# Patient Record
Sex: Female | Born: 1935 | Race: White | Hispanic: No | Smoking: Never smoker
Health system: Southern US, Community
[De-identification: ages and names within clinical notes are randomized; demographics above are authoritative.]

## PROBLEM LIST (undated history)

## (undated) DIAGNOSIS — M4800 Spinal stenosis, site unspecified: Secondary | ICD-10-CM

## (undated) DIAGNOSIS — K5900 Constipation, unspecified: Secondary | ICD-10-CM

## (undated) DIAGNOSIS — F0280 Dementia in other diseases classified elsewhere without behavioral disturbance: Secondary | ICD-10-CM

## (undated) DIAGNOSIS — I10 Essential (primary) hypertension: Secondary | ICD-10-CM

## (undated) DIAGNOSIS — E785 Hyperlipidemia, unspecified: Secondary | ICD-10-CM

## (undated) DIAGNOSIS — R109 Unspecified abdominal pain: Secondary | ICD-10-CM

## (undated) DIAGNOSIS — K648 Other hemorrhoids: Secondary | ICD-10-CM

## (undated) DIAGNOSIS — K644 Residual hemorrhoidal skin tags: Secondary | ICD-10-CM

## (undated) DIAGNOSIS — M1990 Unspecified osteoarthritis, unspecified site: Secondary | ICD-10-CM

## (undated) DIAGNOSIS — M5430 Sciatica, unspecified side: Secondary | ICD-10-CM

## (undated) DIAGNOSIS — I998 Other disorder of circulatory system: Secondary | ICD-10-CM

## (undated) DIAGNOSIS — K449 Diaphragmatic hernia without obstruction or gangrene: Secondary | ICD-10-CM

## (undated) DIAGNOSIS — M25579 Pain in unspecified ankle and joints of unspecified foot: Secondary | ICD-10-CM

## (undated) DIAGNOSIS — K623 Rectal prolapse: Secondary | ICD-10-CM

## (undated) DIAGNOSIS — K219 Gastro-esophageal reflux disease without esophagitis: Secondary | ICD-10-CM

## (undated) DIAGNOSIS — Z973 Presence of spectacles and contact lenses: Secondary | ICD-10-CM

## (undated) DIAGNOSIS — I8500 Esophageal varices without bleeding: Secondary | ICD-10-CM

## (undated) DIAGNOSIS — M419 Scoliosis, unspecified: Secondary | ICD-10-CM

## (undated) DIAGNOSIS — I341 Nonrheumatic mitral (valve) prolapse: Secondary | ICD-10-CM

## (undated) DIAGNOSIS — M353 Polymyalgia rheumatica: Secondary | ICD-10-CM

## (undated) DIAGNOSIS — K625 Hemorrhage of anus and rectum: Secondary | ICD-10-CM

## (undated) DIAGNOSIS — G309 Alzheimer's disease, unspecified: Secondary | ICD-10-CM

## (undated) DIAGNOSIS — M316 Other giant cell arteritis: Secondary | ICD-10-CM

## (undated) DIAGNOSIS — I4891 Unspecified atrial fibrillation: Secondary | ICD-10-CM

## (undated) HISTORY — DX: Unspecified osteoarthritis, unspecified site: M19.90

## (undated) HISTORY — PX: HERNIA REPAIR: SHX51

## (undated) HISTORY — DX: Constipation, unspecified: K59.00

## (undated) HISTORY — PX: ABDOMINAL HYSTERECTOMY: SHX81

## (undated) HISTORY — DX: Presence of spectacles and contact lenses: Z97.3

## (undated) HISTORY — PX: APPENDECTOMY: SHX54

## (undated) HISTORY — DX: Rectal prolapse: K62.3

## (undated) HISTORY — DX: Other disorder of circulatory system: I99.8

## (undated) HISTORY — DX: Diaphragmatic hernia without obstruction or gangrene: K44.9

## (undated) HISTORY — DX: Other giant cell arteritis: M31.6

## (undated) HISTORY — DX: Pain in unspecified ankle and joints of unspecified foot: M25.579

## (undated) HISTORY — DX: Hemorrhage of anus and rectum: K62.5

## (undated) HISTORY — DX: Unspecified abdominal pain: R10.9

## (undated) HISTORY — DX: Other hemorrhoids: K64.8

## (undated) HISTORY — PX: BREAST SURGERY: SHX581

## (undated) HISTORY — DX: Residual hemorrhoidal skin tags: K64.4

## (undated) HISTORY — DX: Polymyalgia rheumatica: M35.3

## (undated) HISTORY — DX: Esophageal varices without bleeding: I85.00

## (undated) HISTORY — PX: COLONOSCOPY: SHX174

---

## 1998-06-28 ENCOUNTER — Encounter: Admission: RE | Admit: 1998-06-28 | Discharge: 1998-06-28 | Payer: Self-pay | Admitting: Internal Medicine

## 1998-12-27 ENCOUNTER — Encounter: Admission: RE | Admit: 1998-12-27 | Discharge: 1998-12-27 | Payer: Self-pay | Admitting: Internal Medicine

## 1999-06-27 ENCOUNTER — Encounter: Admission: RE | Admit: 1999-06-27 | Discharge: 1999-06-27 | Payer: Self-pay | Admitting: Internal Medicine

## 1999-11-28 ENCOUNTER — Encounter: Admission: RE | Admit: 1999-11-28 | Discharge: 1999-11-28 | Payer: Self-pay | Admitting: Internal Medicine

## 2000-03-05 ENCOUNTER — Encounter: Admission: RE | Admit: 2000-03-05 | Discharge: 2000-03-05 | Payer: Self-pay | Admitting: Internal Medicine

## 2000-05-14 ENCOUNTER — Encounter: Admission: RE | Admit: 2000-05-14 | Discharge: 2000-05-14 | Payer: Self-pay | Admitting: Internal Medicine

## 2000-05-30 ENCOUNTER — Encounter: Admission: RE | Admit: 2000-05-30 | Discharge: 2000-05-30 | Payer: Self-pay | Admitting: Internal Medicine

## 2000-06-26 ENCOUNTER — Encounter: Admission: RE | Admit: 2000-06-26 | Discharge: 2000-06-26 | Payer: Self-pay | Admitting: Internal Medicine

## 2000-07-11 ENCOUNTER — Encounter: Admission: RE | Admit: 2000-07-11 | Discharge: 2000-07-11 | Payer: Self-pay

## 2000-07-23 ENCOUNTER — Encounter: Admission: RE | Admit: 2000-07-23 | Discharge: 2000-07-23 | Payer: Self-pay | Admitting: Internal Medicine

## 2000-08-28 ENCOUNTER — Encounter: Admission: RE | Admit: 2000-08-28 | Discharge: 2000-08-28 | Payer: Self-pay | Admitting: Internal Medicine

## 2001-03-12 ENCOUNTER — Encounter: Admission: RE | Admit: 2001-03-12 | Discharge: 2001-03-12 | Payer: Self-pay | Admitting: Internal Medicine

## 2001-08-19 ENCOUNTER — Encounter: Admission: RE | Admit: 2001-08-19 | Discharge: 2001-08-19 | Payer: Self-pay | Admitting: Internal Medicine

## 2001-12-18 ENCOUNTER — Encounter: Admission: RE | Admit: 2001-12-18 | Discharge: 2001-12-18 | Payer: Self-pay | Admitting: Internal Medicine

## 2002-01-30 ENCOUNTER — Encounter: Admission: RE | Admit: 2002-01-30 | Discharge: 2002-01-30 | Payer: Self-pay | Admitting: Internal Medicine

## 2002-04-04 ENCOUNTER — Encounter: Payer: Self-pay | Admitting: Emergency Medicine

## 2002-04-04 ENCOUNTER — Emergency Department (HOSPITAL_COMMUNITY): Admission: EM | Admit: 2002-04-04 | Discharge: 2002-04-04 | Payer: Self-pay | Admitting: Emergency Medicine

## 2002-06-11 ENCOUNTER — Ambulatory Visit (HOSPITAL_COMMUNITY): Admission: RE | Admit: 2002-06-11 | Discharge: 2002-06-11 | Payer: Self-pay | Admitting: Internal Medicine

## 2002-06-11 ENCOUNTER — Encounter: Payer: Self-pay | Admitting: Internal Medicine

## 2002-10-29 ENCOUNTER — Encounter: Admission: RE | Admit: 2002-10-29 | Discharge: 2002-10-29 | Payer: Self-pay | Admitting: Internal Medicine

## 2003-06-18 ENCOUNTER — Ambulatory Visit (HOSPITAL_COMMUNITY): Admission: RE | Admit: 2003-06-18 | Discharge: 2003-06-18 | Payer: Self-pay | Admitting: Internal Medicine

## 2003-06-18 ENCOUNTER — Encounter: Payer: Self-pay | Admitting: Internal Medicine

## 2004-04-14 ENCOUNTER — Encounter: Admission: RE | Admit: 2004-04-14 | Discharge: 2004-04-14 | Payer: Self-pay | Admitting: Internal Medicine

## 2004-07-14 ENCOUNTER — Ambulatory Visit (HOSPITAL_COMMUNITY): Admission: RE | Admit: 2004-07-14 | Discharge: 2004-07-14 | Payer: Self-pay | Admitting: Internal Medicine

## 2005-07-27 ENCOUNTER — Ambulatory Visit (HOSPITAL_COMMUNITY): Admission: RE | Admit: 2005-07-27 | Discharge: 2005-07-27 | Payer: Self-pay | Admitting: Internal Medicine

## 2006-06-12 ENCOUNTER — Encounter: Admission: RE | Admit: 2006-06-12 | Discharge: 2006-06-12 | Payer: Self-pay | Admitting: Specialist

## 2006-08-02 ENCOUNTER — Ambulatory Visit (HOSPITAL_COMMUNITY): Admission: RE | Admit: 2006-08-02 | Discharge: 2006-08-02 | Payer: Self-pay | Admitting: Internal Medicine

## 2007-08-05 ENCOUNTER — Ambulatory Visit (HOSPITAL_COMMUNITY): Admission: RE | Admit: 2007-08-05 | Discharge: 2007-08-05 | Payer: Self-pay | Admitting: *Deleted

## 2007-10-07 ENCOUNTER — Encounter: Admission: RE | Admit: 2007-10-07 | Discharge: 2007-10-07 | Payer: Self-pay | Admitting: Specialist

## 2008-08-06 ENCOUNTER — Ambulatory Visit (HOSPITAL_COMMUNITY): Admission: RE | Admit: 2008-08-06 | Discharge: 2008-08-06 | Payer: Self-pay | Admitting: *Deleted

## 2009-06-25 ENCOUNTER — Ambulatory Visit (HOSPITAL_COMMUNITY): Admission: RE | Admit: 2009-06-25 | Discharge: 2009-06-25 | Payer: Self-pay | Admitting: Internal Medicine

## 2009-08-09 ENCOUNTER — Ambulatory Visit (HOSPITAL_COMMUNITY): Admission: RE | Admit: 2009-08-09 | Discharge: 2009-08-09 | Payer: Self-pay | Admitting: Internal Medicine

## 2009-09-13 ENCOUNTER — Encounter: Admission: RE | Admit: 2009-09-13 | Discharge: 2009-09-13 | Payer: Self-pay | Admitting: Internal Medicine

## 2009-09-17 ENCOUNTER — Emergency Department (HOSPITAL_COMMUNITY): Admission: EM | Admit: 2009-09-17 | Discharge: 2009-09-18 | Payer: Self-pay | Admitting: Emergency Medicine

## 2009-09-17 ENCOUNTER — Encounter: Admission: RE | Admit: 2009-09-17 | Discharge: 2009-09-17 | Payer: Self-pay | Admitting: Internal Medicine

## 2009-09-18 ENCOUNTER — Inpatient Hospital Stay (HOSPITAL_COMMUNITY): Admission: EM | Admit: 2009-09-18 | Discharge: 2009-09-19 | Payer: Self-pay | Admitting: Emergency Medicine

## 2010-02-11 ENCOUNTER — Encounter: Admission: RE | Admit: 2010-02-11 | Discharge: 2010-02-11 | Payer: Self-pay | Admitting: Internal Medicine

## 2010-08-15 ENCOUNTER — Ambulatory Visit (HOSPITAL_COMMUNITY): Admission: RE | Admit: 2010-08-15 | Discharge: 2010-08-15 | Payer: Self-pay | Admitting: Internal Medicine

## 2010-09-12 ENCOUNTER — Encounter: Admission: RE | Admit: 2010-09-12 | Discharge: 2010-09-12 | Payer: Self-pay | Admitting: Internal Medicine

## 2010-10-19 ENCOUNTER — Encounter: Payer: Self-pay | Admitting: Physician Assistant

## 2010-10-21 ENCOUNTER — Encounter
Admission: RE | Admit: 2010-10-21 | Discharge: 2010-10-21 | Payer: Self-pay | Source: Home / Self Care | Attending: Surgery | Admitting: Surgery

## 2010-10-24 ENCOUNTER — Ambulatory Visit: Admit: 2010-10-24 | Payer: Self-pay | Admitting: Physician Assistant

## 2010-10-24 DIAGNOSIS — I4891 Unspecified atrial fibrillation: Secondary | ICD-10-CM | POA: Insufficient documentation

## 2010-10-24 DIAGNOSIS — I10 Essential (primary) hypertension: Secondary | ICD-10-CM | POA: Insufficient documentation

## 2010-10-24 DIAGNOSIS — M1990 Unspecified osteoarthritis, unspecified site: Secondary | ICD-10-CM | POA: Insufficient documentation

## 2010-10-25 ENCOUNTER — Ambulatory Visit: Admission: RE | Admit: 2010-10-25 | Discharge: 2010-10-25 | Payer: Self-pay | Source: Home / Self Care

## 2010-10-25 ENCOUNTER — Encounter: Payer: Self-pay | Admitting: Physician Assistant

## 2010-10-25 ENCOUNTER — Encounter: Payer: Self-pay | Admitting: Cardiology

## 2010-10-25 ENCOUNTER — Ambulatory Visit
Admission: RE | Admit: 2010-10-25 | Discharge: 2010-10-25 | Payer: Self-pay | Source: Home / Self Care | Attending: Physician Assistant | Admitting: Physician Assistant

## 2010-10-25 ENCOUNTER — Ambulatory Visit (HOSPITAL_COMMUNITY)
Admission: RE | Admit: 2010-10-25 | Discharge: 2010-10-25 | Payer: Self-pay | Source: Home / Self Care | Attending: Cardiology | Admitting: Cardiology

## 2010-10-25 DIAGNOSIS — R011 Cardiac murmur, unspecified: Secondary | ICD-10-CM | POA: Insufficient documentation

## 2010-10-28 ENCOUNTER — Telehealth (INDEPENDENT_AMBULATORY_CARE_PROVIDER_SITE_OTHER): Payer: Self-pay | Admitting: *Deleted

## 2010-11-10 NOTE — Letter (Signed)
Summary: Dr. Cathie Olden Office  Dr. Cathie Olden Office   Imported By: Marylou Mccoy 11/03/2010 12:21:03  _____________________________________________________________________  External Attachment:    Type:   Image     Comment:   External Document

## 2010-11-10 NOTE — Progress Notes (Signed)
Summary: Records Request  Faxed OV to [facility] at [facility] ([phone]). Debby Freiberg  October 28, 2010 2:47 PM

## 2010-11-10 NOTE — Assessment & Plan Note (Signed)
Summary: PER OP CLEARANCE ./CY   Visit Type:  surg clearance Referring Provider:  Karie Soda Primary Provider:  Murray Hodgkins  CC:  surgery needed for rectal prolapse.......edema/ankles at times...denies any other complaints today.  History of Present Illness: Tammy Myers is an 77 yo female with reported h/o Parox AFib.  She is on coumadin that is managed by her PCP.  She does not recall ever being established with cardiology.  She lives at [facility] and was a quite active Octogenarian until she developed rectal prolapse in December.  She denies a h/o exertional chest pain or dyspnea.  She denies syncope, near syncope, palpitations, orthopnea, PND or pedal edema.  She has been evaluated by Dr. Michaell Cowing and the plan is to proceed with surgical correction of her rectal prolapse.  She is seen today for surgical clearance.   Current Medications (verified): 1)  Calcium Citrate 250 Mg Tabs (Calcium Citrate) .... 2 Tab Once Daily 2)  Hydrocodone-Acetaminophen 7.5-500 Mg Tabs (Hydrocodone-Acetaminophen) .[name].. 1 Tab Two Times A Day 3)  Prednisone 5 Mg Tabs (Prednisone) .[name].. 1 Tab Once Daily 4)  Senokot 8.6 Mg Tabs (Sennosides) .... 2 Tab At Bedtime As Needed 5)  Aricept 5 Mg Tabs (Donepezil Hcl) .[name].. 1 Tab Once Daily 6)  Coumadin 4 Mg Tabs (Warfarin Sodium) .... On Hold  Allergies (verified): No Known Drug Allergies  Past History:  Past Medical History: History of paroxysmal atrial fibrillation on chronic Coumadin per PCP Reported h/o Hypertension but she denies this and is not on medications Osteoarthritis.  Osteoporosis Reported h/o mitral valve prolapse Polymyalgia Rheumatica Temporal Arteritis      Past Surgical History: Hysterectomy.  Appendectomy Hernia repair  Family History: She is a triplet and has her sister with her today. No premature CAD.  Social History: [facility]  She is a retired  Environmental manager.  There is no history of  alcohol or tobacco use.      Review of Systems       As per  the HPI.  All other systems reviewed and negative.   Vital Signs:  Patient profile:   77 year old female Height:      64 inches Weight:      105 pounds BMI:     18.09 Pulse rate:   91 / minute Pulse rhythm:   irregular BP sitting:   98 / 58  (right arm) Cuff size:   regular  Vitals Entered By: Danielle Rankin, CMA (October 25, 2010 12:52 PM)  Physical Exam  General:  Well nourished, well developed, elderly female in no acute distress HEENT: normal Neck: no JVD at 90 degrees Cardiac:  normal S1, S2; RRR; 1-2/6 systolic murmur at RUSB Lungs:  clear to auscultation bilaterally, no wheezing, rhonchi or rales Abd: soft, nontender, no hepatomegaly Ext: trace ankle edema bilat. Vascular: no carotid  bruits Skin: warm and dry Neuro:  CNs 2-12 intact, no focal abnormalities noted    EKG  Procedure date:  10/25/2010  Findings:      Normal sinus rhythm with rate of:  91 LAD PRWP NSSTTW changes  Impression & Recommendations:  Problem # 1:  ATRIAL FIBRILLATION, PAROXYSMAL (ICD-427.31) She is maintaining NSR.  Coumadin is managed by her PCP. She is certainly at risk for recurrent AFib during surgery and close attention should be made to control her rate if this should happen.  Orders: EKG w/ Interpretation (93000)  Problem # 2:  PRE-OPERATIVE CARDIOVASCULAR EXAMINATION (ICD-V72.81)  She is not having any angina and does not appear to have any unstable cardiac conditions.  She does have a murmur on exam.  Will check an echo to make sure she does not have any significant valvular pathology or LV dysfunction.  If this is ok, she will not require any further cardiac workup.  With her age, she is at mild to moderate risk for cardiac complications.  Other Orders: Echocardiogram (Echo)  Patient Instructions: 1)  Your physician recommends that you schedule a follow-up appointment in: AS NEEDED 2)  Your physician  recommends that you continue on your current medications as directed. Please refer to the Current Medication list given to you today. 3)  Your physician has requested that you have an echocardiogram.  Echocardiography is a painless test that uses sound waves to create images of your heart. It provides your doctor with information about the size and shape of your heart and how well your heart's chambers and valves are working.  This procedure takes approximately one hour. There are no restrictions for this procedure. TODAY    Appended Document: PER OP CLEARANCE ./CY Patient seen with Tereso Newcomer.  She clearly needs surgery.  No angina or CHF symptoms.  She does have a SEM which is prominent, and perhaps consistent with AS of moderate degree.  Echo is pending.  TS

## 2010-11-14 ENCOUNTER — Other Ambulatory Visit: Payer: Self-pay | Admitting: Surgery

## 2010-11-14 ENCOUNTER — Ambulatory Visit (HOSPITAL_COMMUNITY)
Admission: RE | Admit: 2010-11-14 | Discharge: 2010-11-14 | Disposition: A | Discharge status: Home / Self Care | Payer: Medicare Other | Source: Ambulatory Visit | Attending: Surgery | Admitting: Surgery

## 2010-11-14 ENCOUNTER — Other Ambulatory Visit (HOSPITAL_COMMUNITY): Payer: Self-pay | Admitting: Surgery

## 2010-11-14 ENCOUNTER — Telehealth (INDEPENDENT_AMBULATORY_CARE_PROVIDER_SITE_OTHER): Payer: Self-pay | Admitting: *Deleted

## 2010-11-14 ENCOUNTER — Encounter (HOSPITAL_COMMUNITY): Payer: Medicare Other

## 2010-11-14 DIAGNOSIS — Z01818 Encounter for other preprocedural examination: Secondary | ICD-10-CM

## 2010-11-14 DIAGNOSIS — Z01812 Encounter for preprocedural laboratory examination: Secondary | ICD-10-CM | POA: Insufficient documentation

## 2010-11-14 LAB — CBC
HCT: 40.2 % (ref 36.0–46.0)
Hemoglobin: 13.1 g/dL (ref 12.0–15.0)
RBC: 4.02 MIL/uL (ref 3.87–5.11)
RDW: 14.5 % (ref 11.5–15.5)
WBC: 7.4 10*3/uL (ref 4.0–10.5)

## 2010-11-14 LAB — PROTIME-INR: INR: 0.98 (ref 0.00–1.49)

## 2010-11-14 LAB — APTT: aPTT: 31 seconds (ref 24–37)

## 2010-11-14 LAB — SURGICAL PCR SCREEN: Staphylococcus aureus: NEGATIVE

## 2010-11-16 ENCOUNTER — Inpatient Hospital Stay (HOSPITAL_COMMUNITY)
Admission: RE | Admit: 2010-11-16 | Discharge: 2010-11-22 | DRG: 330 | Disposition: A | Discharge status: Skilled Nursing Facility | Payer: Medicare Other | Source: Ambulatory Visit | Attending: Surgery | Admitting: Surgery

## 2010-11-16 DIAGNOSIS — M316 Other giant cell arteritis: Secondary | ICD-10-CM | POA: Diagnosis present

## 2010-11-16 DIAGNOSIS — F068 Other specified mental disorders due to known physiological condition: Secondary | ICD-10-CM | POA: Diagnosis present

## 2010-11-16 DIAGNOSIS — I4891 Unspecified atrial fibrillation: Secondary | ICD-10-CM | POA: Diagnosis present

## 2010-11-16 DIAGNOSIS — M1990 Unspecified osteoarthritis, unspecified site: Secondary | ICD-10-CM | POA: Diagnosis present

## 2010-11-16 DIAGNOSIS — K623 Rectal prolapse: Principal | ICD-10-CM | POA: Diagnosis present

## 2010-11-16 DIAGNOSIS — M353 Polymyalgia rheumatica: Secondary | ICD-10-CM | POA: Diagnosis present

## 2010-11-16 DIAGNOSIS — I059 Rheumatic mitral valve disease, unspecified: Secondary | ICD-10-CM | POA: Diagnosis present

## 2010-11-16 DIAGNOSIS — Q438 Other specified congenital malformations of intestine: Secondary | ICD-10-CM

## 2010-11-16 DIAGNOSIS — M5430 Sciatica, unspecified side: Secondary | ICD-10-CM | POA: Diagnosis present

## 2010-11-16 DIAGNOSIS — M810 Age-related osteoporosis without current pathological fracture: Secondary | ICD-10-CM | POA: Diagnosis present

## 2010-11-16 DIAGNOSIS — Z7901 Long term (current) use of anticoagulants: Secondary | ICD-10-CM

## 2010-11-16 HISTORY — PX: RECTAL PROLAPSE REPAIR, RECTOPEXY: SHX2311

## 2010-11-17 ENCOUNTER — Other Ambulatory Visit: Payer: Self-pay | Admitting: Surgery

## 2010-11-17 LAB — CREATININE, SERUM
Creatinine, Ser: 0.74 mg/dL (ref 0.4–1.2)
GFR calc Af Amer: >60 mL/min (ref >60)
GFR calc non Af Amer: >60 mL/min (ref >60)
GFR calc non Af Amer: >60 mL/min (ref >60)

## 2010-11-17 LAB — CBC
MCV: 98.4 fL (ref 78.0–100.0)
Platelets: 170 10*3/uL (ref 150–400)
Platelets: 182 10*3/uL (ref 150–400)
RBC: 3.72 MIL/uL — ABNORMAL LOW (ref 3.87–5.11)
RDW: 14.5 % (ref 11.5–15.5)
WBC: 19.7 10*3/uL — ABNORMAL HIGH (ref 4.0–10.5)
WBC: 18.2 10*3/uL — ABNORMAL HIGH (ref 4.0–10.5)

## 2010-11-17 LAB — POCT I-STAT 4, (NA,K, GLUC, HGB,HCT)
Glucose, Bld: 97 mg/dL (ref 70–99)
HCT: 39 % (ref 36.0–46.0)

## 2010-11-17 LAB — POTASSIUM: Potassium: 3.9 mEq/L (ref 3.5–5.1)

## 2010-11-18 LAB — CBC
MCH: 32.3 pg (ref 26.0–34.0)
MCHC: 32.6 g/dL (ref 30.0–36.0)
Platelets: 155 10*3/uL (ref 150–400)
RDW: 14.8 % (ref 11.5–15.5)

## 2010-11-18 LAB — CREATININE, SERUM: GFR calc Af Amer: >60 mL/min (ref >60)

## 2010-11-19 LAB — CREATININE, SERUM: GFR calc non Af Amer: >60 mL/min (ref >60)

## 2010-11-19 LAB — CBC
HCT: 36.5 % (ref 36.0–46.0)
MCHC: 33.2 g/dL (ref 30.0–36.0)
MCV: 98.4 fL (ref 78.0–100.0)
RDW: 14.4 % (ref 11.5–15.5)

## 2010-11-21 LAB — CBC
Hemoglobin: 13.4 g/dL (ref 12.0–15.0)
MCH: 32.5 pg (ref 26.0–34.0)
MCV: 96.6 fL (ref 78.0–100.0)
RBC: 4.12 MIL/uL (ref 3.87–5.11)

## 2010-11-21 LAB — BASIC METABOLIC PANEL
GFR calc non Af Amer: >60 mL/min (ref >60)
Glucose, Bld: 103 mg/dL — ABNORMAL HIGH (ref 70–99)
Potassium: 3.4 mEq/L — ABNORMAL LOW (ref 3.5–5.1)
Sodium: 136 mEq/L (ref 135–145)

## 2010-11-21 LAB — PROTIME-INR: Prothrombin Time: 13 seconds (ref 11.6–15.2)

## 2010-11-21 NOTE — Op Note (Signed)
NAMEMarland Myers  GISSELLE, Myers            ACCOUNT NO.:  [id number]  MEDICAL RECORD NO.:  [id number]           PATIENT TYPE:  O  LOCATION:  XRAY                         FACILITY[facility]  [facility]  PHYSICIAN:  Ardeth Sportsman, MD     DATE OF BIRTH:  20-Oct-1920  DATE OF PROCEDURE: DATE OF DISCHARGE:  11/14/2010                              OPERATIVE REPORT   PRIMARY CARE PHYSICIAN:  Lenon Curt. Chilton Si, MD  GASTROENTEROLOGIST:  [facility].  ASSISTANT:  Royston Sinner  PREOPERATIVE DIAGNOSIS:  Rectal prolapse.  POSTOPERATIVE DIAGNOSIS:  Rectal prolapse.  PROCEDURES PERFORMED: 1. Diagnostic laparoscopy. 2. Laparoscopic-assisted sigmoid colectomy. 3. Laparoscopic rectopexy with suturing and posterior reinforcement     biologic mesh.  ANESTHESIA: 1. General anesthesia. 2. Local anesthetic field block.  SPECIMENS: 1. Sigmoid colon.  Open end is proximal. 2. Anastomotic rings.  Blue stitch in the proximal ring.  DRAINS:  None.  ESTIMATED BLOOD LOSS:  50 mL.  COMPLICATIONS:  None. INDICATIONS:  Ms. Tammy Myers is an 77 year old female who has pretty good functional life.  However, she has been having recurrent prolapsing episodes.  This has been going on for over 6 months.  She has had episodes of having to go to emergency room and have it reduced.  But, she had to come to our office twice to have it reduced.  Anatomy and physiology of the digestive tract was explained.  Pathophysiology of pelvic floor prolapse and rectal procidentia/prolapse was discussed. After discussion, recommendation was made for rectopexy with suture and mesh.  Possibility of resection and redundant colon have been discussed as well.  Risks, benefits, and alternatives were discussed.  Questions answered.  She agreed to proceed.  OPERATIVE FINDINGS:  She had rectal procidentia of about at 8 cm very easily, some times more in the office.  She had a very redundant sigmoid colon with pretty good adhesions of her  descending colon.  She had no major intraabdominal adhesions.  DESCRIPTION OF PROCEDURE:  Informed consent was confirmed.  The patient had failed to do an all prep, although she has had loose stools already. She held her warfarin preoperatively.  She was given subcutaneous heparin preoperatively.  She had sequential compression devices during the entire case.  She underwent general anesthesia without any difficulty.  She was placed in lithotomy with arms tucked.  Her abdomen and perineum were prepped and draped in sterile fashion.  A surgical time-out confirmed the plan.  I placed a #5 mm port through the inferior part of the umbilicus using a cut down technique.  I induced carbon dioxide insufflation and made sure she had a clean entry.  Camera inspection was clear.  I placed a 5-mm port in the left and right mid abdomen.  I placed a 2 mm port in the right lower quadrant later in the case.  I did diagnostic laparoscopy and positioned her head down and got the small bowel out of the way.  The terminal ileum did have some adhesions down in the pelvis, I mobilized those off, the retroperitoneum and pelvic brim to help get that out of the way.  I could see she had a  very redundant sigmoid colon.  I could pull the rectum up, when I did that, sigmoid colon started to twist.  It was the complete volvulus, but it was concerning for that.  Therefore, I felt that resection was warranted.  I scored the peritoneum on the proximal rectum into the presacral region posterior to the mesorectum.  I followed that off bluntly and mobilized that off down to the level of coccyx.  I transected the ligaments between the rectum and the coccyx as well until I can get down the level of the levators posteriorly.  This provided good posterior medialization.  I avoided taking any of the lateral side feeding branches to the remainder of the distal rectum.  I did create a window in the proximal rectum as well,  away from the major levators as well.  With this good mobilization, I could bring the mid-proximal rectum up. I created a window in the mesentery and transected at the junction between the proximal and mid-rectum using a laparoscopic stapler.  I made a 5-cm transverse incision suprapubically and placed a GelPort as a wound protector.  I eviscerated the sigmoid colon.  I took the sigmoid colon mesentery using the EnSeal bipolar system upon the sigmoid and doing the ligation extracorporeally.  I followed that down until I came to the junction between the sigmoid and descending colon where there were nice lateral attachments.  I did a soft bowel clamp and transected at the sigmoid/descending colon junction.  We did sizers and a 33-mm EEA sizer easily fell through.  I placed a 0-Prolene stitch around the proximal end, placed an anvil with 33-mm EEA stapler in.  Dr. Carolynne Edouard scrubbed down.  I did rigid proctoscopy.  The rectum was cleared out.  We could easily pass the stapler out.  We brought the spike out from the staple line of the proximal rectal stump.  I secured the inflow of the descending colon to the proximal rectal stump and tightened the stapler down on the anvil.  The stapler was then clamped for 60 seconds.  It was fired.  It was clamped while fired for 30 seconds.  The stapler was released.  Dr. Carolynne Edouard got 2 excellent anastomotic rings.  We performed the leak test with me clamping the descending colon proximally and doing insufflation.  He did rectal endoluminal  insufflation under water.  There was no leak.  It had excellent distention.  The anastomotic line looked healthy without any ischemia.  Our next procedure was rectopexy.  I took a Flex HD Biologic 10 x 16-cm mesh.  Having trimmed a few centimeters off in both dimensions, I could layer it in the prerectal space.  I used a laparoscopic tacker and tacked it to the sacrum posteriorly.  I secured the posterior rectum to the  mesh using interrupted 3-0 PDS stitches x4 and that helped pulling the rectum up under good tension and avoiding any re-prolapse.  I placed another stitch in the proximal rectum to the sacral promontory periosteum on the right.  I could not see a good view on the left so I took the proximal rectum on the left and secured it to the left pelvic brim to some good strong lateral sidewall attachments.  They did not seem wispy.  That provided some good tension with mesh as well as rectopexy.  Therefore, she was pexied in 3 places on each side for a total of 6 locations.  We did copious irrigation with clear return.  Again, the anastomosis  was healthy and viable and was not kinked or twisted.  There was no more redundant colon.  The patient was extubated and sent to recovery room in stable condition. I anticipated her being able to go to the floor.  I have discussed postoperative findings with the patient's family.     Ardeth Sportsman, MD     SCG/MEDQ  D:  11/16/2010  T:  11/17/2010  Job:  [id number]  Electronically Signed by Karie Soda MD on 11/21/2010 02:20:26 PM

## 2010-11-22 LAB — PROTIME-INR: INR: 1.08 (ref 0.00–1.49)

## 2010-11-24 NOTE — Progress Notes (Signed)
Summary: Records Request  Faxed OV & EKG to [facility] at [facility] ([phone]). Debby Freiberg  November 14, 2010 11:52 AM

## 2010-11-28 NOTE — Discharge Summary (Signed)
NAMEMarland Kitchen  LEWIS, GRIVAS            ACCOUNT NO.:  [id number]  MEDICAL RECORD NO.:  [id number]           PATIENT TYPE:  I  LOCATION:  1607                         FACILITY:  [facility]  PHYSICIAN:  Ardeth Sportsman, MD     DATE OF BIRTH:  1921-02-20  DATE OF ADMISSION:  11/16/2010 DATE OF DISCHARGE:  11/22/2010                              DISCHARGE SUMMARY   PRIMARY CARE PHYSICIAN:  Lenon Curt. Chilton Si, MD  CARDIOLOGIST:  Selena Batten, Tereso Newcomer, PA-C, for Arturo Morton Riley Kill, MD, FACC  SURGEON:  Ardeth Sportsman, MD  PRINCIPAL DIAGNOSIS:  Rectal prolapse.  PRINCIPAL PROCEDURE PERFORMED:  Laparoscopically assisted sigmoid colectomy and rectopexy, November 16, 2010.  OTHER DIAGNOSES: 1. Atrial fibrillation, on chronic anticoagulation. 2. Temporal arteritis. 3. Polymyalgia rheumatica. 4. Mitral valve prolapse. 5. Osteoporosis. 6. Osteoarthritis. 7. Probable early dementia. 8. Chronic pain and sciatica. 9. Scoliosis. 10.Mild memory impairment, really not dementia.  DISCHARGE MEDICATIONS: 1. Aricept 5 mg p.o. daily. 2. Hydrocodone 5/500 one p.o. b.i.d. p.r.n. mild to moderate pain. 3. Oxycodone 5-10 mg p.o. q.6 hours moderate to severe pain.  One or     the other not both at the same time. 4. Colace 100 mg p.o. daily decreased from b.i.d. 5. Calcium carbonate/vitamin D p.o. daily. 6. Prednisone 5 mg p.o. q.a.m. 7. Warfarin 4 mg p.o. q.a.m. and adjusted per [facility] and     Dr. Chilton Si.  SUMMARY OF HOSPITAL COURSE:  Tammy Myers is an 77 year old female who has had recurrent episodes of rectal prolapse, severely debilitating with uncontrollable diarrhea.  She has a little bit of mild memory impairment.  She is actually quite functional at [facility] and given her decent quality of life and debilitating status with this condition, she was sent to me here for surgical evaluation.  She underwent surgery as noted above.  She had redundant colon and required  resection.  Postoperatively, she was placed on anti-ileus protocol.  She was mobilized.  She was started on sips.  She had mild decreased urine output, responded to fluid.  She had evaluation by Physical Therapy and Occupational Therapy and it was felt that she would benefit from skilled placement for the first few weeks, next she was advanced her diet.  Her blood pressure was elevated, I did place her on some perioperative metoprolol and that seemed to control things well.  By the time of discharge, she was walking in the hallways with assistant, she was having regular bowel movements, she was tolerating p.o.  She had better p.o. pain control after switching on hydrocodone and oxycodone.  Based on improvement, I will reasonably discharge home with the following instructions, 1. She is to go to [facility] for brief skilled facility rehab. 2. She should return to clinic to see me in 1-2 weeks. 3. She should call if she has worsening fever, chills, sweats, nausea,     vomiting, uncontrolled pain, diarrhea, drains from her incisions,  or other concerns. 4. She should have her anticoagulation check regularly from     prothrombin time, INR at the skilled facility and adjust it as     needed.  Anticoagulation per Dr. Chilton Si and [facility], Dr.     Riley Kill.     Ardeth Sportsman, MD     SCG/MEDQ  D:  11/22/2010  T:  11/22/2010  Job:  [id number]  Electronically Signed by Karie Soda MD on 11/28/2010 08:46:52 AM

## 2010-12-20 NOTE — Letter (Signed)
Summary: [facility]iac Clearance   [facility]iac Clearance   Imported By: Roderic Ovens 12/09/2010 15:00:58  _____________________________________________________________________  External Attachment:    Type:   Image     Comment:   External Document

## 2011-01-10 LAB — DIFFERENTIAL
Basophils Absolute: 0 10*3/uL (ref 0.0–0.1)
Eosinophils Relative: 0 % (ref 0–5)
Lymphocytes Relative: 11 % — ABNORMAL LOW (ref 12–46)
Lymphocytes Relative: 17 % (ref 12–46)
Lymphs Abs: 1.1 10*3/uL (ref 0.7–4.0)
Monocytes Absolute: 0.3 10*3/uL (ref 0.1–1.0)
Monocytes Relative: 6 % (ref 3–12)
Neutrophils Relative %: 75 % (ref 43–77)

## 2011-01-10 LAB — URINE MICROSCOPIC-ADD ON

## 2011-01-10 LAB — URINALYSIS, ROUTINE W REFLEX MICROSCOPIC
Bilirubin Urine: NEGATIVE
Glucose, UA: NEGATIVE mg/dL
Glucose, UA: NEGATIVE mg/dL
Leukocytes, UA: NEGATIVE
Nitrite: NEGATIVE
Protein, ur: NEGATIVE mg/dL
Specific Gravity, Urine: 1.023 (ref 1.005–1.030)
Urobilinogen, UA: 0.2 mg/dL (ref 0.0–1.0)
Urobilinogen, UA: 0.2 mg/dL (ref 0.0–1.0)

## 2011-01-10 LAB — CBC
MCHC: 32.6 g/dL (ref 30.0–36.0)
MCV: 101.7 fL — ABNORMAL HIGH (ref 78.0–100.0)
Platelets: 174 10*3/uL (ref 150–400)
Platelets: 191 10*3/uL (ref 150–400)
RDW: 14 % (ref 11.5–15.5)
WBC: 4.9 10*3/uL (ref 4.0–10.5)

## 2011-01-10 LAB — COMPREHENSIVE METABOLIC PANEL
AST: 47 U/L — ABNORMAL HIGH (ref 0–37)
AST: 57 U/L — ABNORMAL HIGH (ref 0–37)
Albumin: 3.3 g/dL — ABNORMAL LOW (ref 3.5–5.2)
Albumin: 3.6 g/dL (ref 3.5–5.2)
Alkaline Phosphatase: 200 U/L — ABNORMAL HIGH (ref 39–117)
Calcium: 9.3 mg/dL (ref 8.4–10.5)
Chloride: 100 mEq/L (ref 96–112)
Creatinine, Ser: 0.96 mg/dL (ref 0.4–1.2)
Creatinine, Ser: 1.1 mg/dL (ref 0.4–1.2)
GFR calc Af Amer: 57 mL/min — ABNORMAL LOW (ref >60)
GFR calc Af Amer: >60 mL/min (ref >60)
Potassium: 4.4 mEq/L (ref 3.5–5.1)
Total Bilirubin: 0.5 mg/dL (ref 0.3–1.2)

## 2011-01-10 LAB — LIPASE, BLOOD: Lipase: 12 U/L (ref 11–59)

## 2011-01-10 LAB — PROTIME-INR
INR: 2.19 — ABNORMAL HIGH (ref 0.00–1.49)
Prothrombin Time: 28.5 seconds — ABNORMAL HIGH (ref 11.6–15.2)
Prothrombin Time: 57.8 seconds — ABNORMAL HIGH (ref 11.6–15.2)

## 2011-01-10 LAB — APTT: aPTT: 49 seconds — ABNORMAL HIGH (ref 24–37)

## 2011-01-10 LAB — SEDIMENTATION RATE: Sed Rate: 10 mm/hr (ref 0–22)

## 2011-02-07 ENCOUNTER — Encounter (INDEPENDENT_AMBULATORY_CARE_PROVIDER_SITE_OTHER): Payer: Self-pay | Admitting: Surgery

## 2011-03-20 ENCOUNTER — Other Ambulatory Visit: Payer: Self-pay | Admitting: Internal Medicine

## 2011-03-20 ENCOUNTER — Ambulatory Visit
Admission: RE | Admit: 2011-03-20 | Discharge: 2011-03-20 | Disposition: A | Discharge status: Home / Self Care | Payer: PRIVATE HEALTH INSURANCE | Source: Ambulatory Visit | Attending: Internal Medicine | Admitting: Internal Medicine

## 2011-03-20 DIAGNOSIS — M549 Dorsalgia, unspecified: Secondary | ICD-10-CM

## 2011-08-28 ENCOUNTER — Encounter (INDEPENDENT_AMBULATORY_CARE_PROVIDER_SITE_OTHER): Payer: Self-pay | Admitting: Surgery

## 2011-08-28 ENCOUNTER — Ambulatory Visit (INDEPENDENT_AMBULATORY_CARE_PROVIDER_SITE_OTHER): Payer: Medicare Other | Admitting: Surgery

## 2011-08-28 VITALS — BP 108/60 | HR 88 | Temp 98.2°F | Resp 12 | Ht 63.0 in | Wt 102.0 lb

## 2011-08-28 DIAGNOSIS — K648 Other hemorrhoids: Secondary | ICD-10-CM | POA: Insufficient documentation

## 2011-08-28 DIAGNOSIS — K5900 Constipation, unspecified: Secondary | ICD-10-CM

## 2011-08-28 DIAGNOSIS — K5909 Other constipation: Secondary | ICD-10-CM | POA: Insufficient documentation

## 2011-08-28 NOTE — Patient Instructions (Signed)

## 2011-08-28 NOTE — Progress Notes (Signed)
Subjective:     Patient ID: Tammy Myers, female   DOB: 1921-06-20, 77 y.o.   MRN: [id number]  HPI  Patient Care Team: [name], MD as PCP - General (Internal Medicine) Theda Belfast as Consulting Physician (Gastroenterology) Shawnie Pons, MD as Consulting Physician (Cardiology)  This patient is a 77 y.o.female who presents today for surgical evaluation.   Reason for visit: Mucosal prolapse. Question of recurrent rectal prolapse.  Patient is an elderly female that I did a resection and rectopexy for rectal prolapse on last year. She claims to be taking a stool softer and having a bowel movement every day. She was sent to a female doctor. (?gynecologist - they cannot recall specialty nor location) whom noted prolapsing mucosa and was worried about recurrence.   She sent the patient to me for evaluation.  The patient denies any pain or bleeding. No discomfort. No fevers chills or sweats  Past Medical History  Diagnosis Date  . PMR (polymyalgia rheumatica)   . Temporal arteritis   . Osteoporosis   . Hearing loss   . Wears glasses   . Rectal bleeding   . Arthritis   . Rectal prolapse     Past Surgical History  Procedure Date  . Abdominal hysterectomy   . Appendectomy   . Colonoscopy   . Hernia repair     [facility]  . Colon surgery 11/16/2010    Lap sigmoid colectomy & biologic mesh rectopexy    History   Social History  . Marital Status: Widowed    Spouse Name: N/A    Number of Children: N/A  . Years of Education: N/A   Occupational History  . Not on file.   Social History Main Topics  . Smoking status: Never Smoker   . Smokeless tobacco: Not on file  . Alcohol Use: No  . Drug Use: No  . Sexually Active:    Other Topics Concern  . Not on file   Social History Narrative  . No narrative on file    Family History  Problem Relation Age of Onset  . Cancer Brother     bladder    Current outpatient prescriptions:Calcium Carbonate-Vitamin D (CALCIUM + D  PO), Take 750 mg by mouth 2 (two) times daily.  , Disp: , Rfl: ;  docusate sodium (COLACE) 100 MG capsule, Take 100 mg by mouth 2 (two) times daily.  , Disp: , Rfl: ;  donepezil (ARICEPT) 5 MG tablet, Take 5 mg by mouth at bedtime as needed.  , Disp: , Rfl: ;  ESTRACE VAGINAL 0.1 MG/GM vaginal cream, Once a week., Disp: , Rfl:  HYDROcodone-acetaminophen (VICODIN) 5-500 MG per tablet, Take 1 tablet by mouth every 6 (six) hours as needed.  , Disp: , Rfl: ;  predniSONE (DELTASONE) 5 MG tablet, Take 5 mg by mouth daily.  , Disp: , Rfl: ;  warfarin (COUMADIN) 4 MG tablet, Take 4 mg by mouth daily.  , Disp: , Rfl:   No Known Allergies     Review of Systems  Constitutional: Negative for fever, chills, diaphoresis, appetite change and fatigue.  HENT: Negative for ear pain, sore throat, trouble swallowing, neck pain and ear discharge.   Eyes: Negative for photophobia, discharge and visual disturbance.  Respiratory: Negative for cough, choking, chest tightness and shortness of breath.   Cardiovascular: Negative for chest pain and palpitations.  Gastrointestinal: Positive for constipation. Negative for nausea, vomiting, abdominal pain, diarrhea, blood in stool, abdominal distention, anal bleeding and rectal  pain.  Genitourinary: Positive for enuresis. Negative for dysuria, frequency, decreased urine volume, vaginal bleeding, vaginal discharge and difficulty urinating.  Musculoskeletal: Negative for myalgias and gait problem.  Skin: Negative for color change, pallor and rash.  Neurological: Negative for dizziness, speech difficulty, weakness and numbness.  Hematological: Negative for adenopathy.  Psychiatric/Behavioral: Negative for confusion and agitation. The patient is not nervous/anxious.        Objective:   Physical Exam  Constitutional: She is oriented to person, place, and time. She appears well-developed and well-nourished. No distress.  HENT:  Head: Normocephalic.  Mouth/Throat: Oropharynx  is clear and moist. No oropharyngeal exudate.  Eyes: Conjunctivae and EOM are normal. Pupils are equal, round, and reactive to light. No scleral icterus.  Neck: Normal range of motion and full passive range of motion without pain. No tracheal deviation present.       Severe cervical kyphosis  Cardiovascular: Normal rate and intact distal pulses.   Pulmonary/Chest: Effort normal. No respiratory distress. She exhibits no tenderness.  Abdominal: Soft. She exhibits no distension and no mass. There is no tenderness. There is no rebound and no guarding. Hernia confirmed negative in the right inguinal area and confirmed negative in the left inguinal area.       Incisions clean with normal healing ridges.  No hernias  Genitourinary: Vagina normal. No vaginal discharge found.       Perianal skin clean with good hygiene.  No pruritis.  No pilonidal disease.  No fissure.  No abscess/fistula.    Tolerates digital and anoscopic rectal exam.  Normal sphincter tone.  No rectal masses.  Large rectal vault with firm, hard stool  Hemorrhoid R post > ant prolapse.  L lat WNL.  The anatomy & physiology of the anorectal region was discussed.  The pathophysiology of hemorrhoids and differential diagnosis was discussed.  Natural history progression  was discussed.   I stressed the importance of a bowel regimen to have daily soft bowel movements to minimize progression of disease.     The patient's symptoms are not adequately controlled.  Therefore, I recommended banding to treat the hemorrhoids.  I went over the technique, risks, benefits, and alternatives.   Goals of post-operative recovery were discussed as well.  Questions were answered.  The patient expressed understanding & wished to proceed.  The patient was positioned in the lateral decubitus position.  Perianal & rectal examination was done.  Using anoscopy, I ligated the hemorrhoids above the dentate line with banding x 2 (one for R ant & post piles).  The  patient tolerated the procedure well.  Educational handouts further explaining the pathology, treatment options, and bowel regimen were given as well.     Musculoskeletal: Normal range of motion. She exhibits no tenderness.  Lymphadenopathy:       Right: No inguinal adenopathy present.       Left: No inguinal adenopathy present.  Neurological: She is alert and oriented to person, place, and time. No cranial nerve deficit. She exhibits normal muscle tone. Coordination normal.  Skin: Skin is warm and dry. No rash noted. She is not diaphoretic.  Psychiatric: She has a normal mood and affect. Her behavior is normal.       Assessment:     Prolapsing hemorrhoids - banded     Plan:     Increase activity as tolerated.  Do not push through pain.  Advanced on diet as tolerated. Bowel regimen to avoid problems.  Start fiber regularly  Return to clinic  one month. The patient expressed understanding and appreciation

## 2011-09-22 ENCOUNTER — Telehealth (INDEPENDENT_AMBULATORY_CARE_PROVIDER_SITE_OTHER): Payer: Self-pay | Admitting: Surgery

## 2011-09-25 ENCOUNTER — Encounter (INDEPENDENT_AMBULATORY_CARE_PROVIDER_SITE_OTHER): Payer: PRIVATE HEALTH INSURANCE | Admitting: Surgery

## 2011-10-12 ENCOUNTER — Ambulatory Visit (INDEPENDENT_AMBULATORY_CARE_PROVIDER_SITE_OTHER): Payer: Medicare Other | Admitting: Surgery

## 2011-10-12 ENCOUNTER — Encounter (INDEPENDENT_AMBULATORY_CARE_PROVIDER_SITE_OTHER): Payer: Self-pay | Admitting: Surgery

## 2011-10-12 VITALS — BP 124/76 | HR 80 | Temp 97.2°F | Resp 12 | Ht 63.0 in | Wt 112.2 lb

## 2011-10-12 DIAGNOSIS — K648 Other hemorrhoids: Secondary | ICD-10-CM

## 2011-10-12 DIAGNOSIS — K5900 Constipation, unspecified: Secondary | ICD-10-CM

## 2011-10-12 DIAGNOSIS — K5909 Other constipation: Secondary | ICD-10-CM

## 2011-10-12 NOTE — Progress Notes (Signed)
Subjective:     Patient ID: Tammy Myers, female   DOB: 05/12/1921, 78 y.o.   MRN: [id number]  HPI   Patient Care Team: [name], MD as PCP - General (Internal Medicine) Theda Belfast, MD as Consulting Physician (Gastroenterology) Shawnie Pons, MD as Consulting Physician (Cardiology) Robley Fries as Attending Physician (Obstetrics and Gynecology)  This patient is a 78 y.o.female who presents today for surgical evaluation.   Reason for visit: Mucosal prolapse. Question of recurrent rectal prolapse.  Patient is an elderly female that I did a resection and rectopexy for rectal prolapse on last year. She claims to be taking a stool softer and having a bowel movement every day. She was sent to a female doctor. (gynecologist - ?Dr. Juliene Pina near [facility]?..they think...) whom noted prolapsing mucosa and was worried about recurrence.   She sent the patient to me for evaluation.  I found some int hemorrhoids prolapsing that I banded.  She comes in for follow-up  The patient denies any pain or bleeding. No fevers chills or sweats.  Sore to sit on.  She thinks her bladder has fallen down - no urinary incont.  Due to see her gynecologist next week  Past Medical History  Diagnosis Date  . PMR (polymyalgia rheumatica)   . Temporal arteritis   . Osteoporosis   . Hearing loss   . Wears glasses   . Rectal bleeding   . Arthritis   . Rectal prolapse     Past Surgical History  Procedure Date  . Abdominal hysterectomy   . Appendectomy   . Colonoscopy   . Hernia repair     LIH  . Colon surgery 11/16/2010    Lap sigmoid colectomy & biologic mesh rectopexy    History   Social History  . Marital Status: Widowed    Spouse Name: N/A    Number of Children: N/A  . Years of Education: N/A   Occupational History  . Not on file.   Social History Main Topics  . Smoking status: Never Smoker   . Smokeless tobacco: Not on file  . Alcohol Use: No  . Drug Use: No  . Sexually  Active:    Other Topics Concern  . Not on file   Social History Narrative  . No narrative on file    Family History  Problem Relation Age of Onset  . Cancer Brother     bladder    Current outpatient prescriptions:Calcium Carbonate-Vitamin D (CALCIUM + D PO), Take 750 mg by mouth 2 (two) times daily.  , Disp: , Rfl: ;  docusate sodium (COLACE) 100 MG capsule, Take 100 mg by mouth 2 (two) times daily.  , Disp: , Rfl: ;  donepezil (ARICEPT) 5 MG tablet, Take 5 mg by mouth at bedtime as needed.  , Disp: , Rfl: ;  ESTRACE VAGINAL 0.1 MG/GM vaginal cream, Once a week., Disp: , Rfl:  HYDROcodone-acetaminophen (VICODIN) 5-500 MG per tablet, Take 1 tablet by mouth every 6 (six) hours as needed.  , Disp: , Rfl: ;  predniSONE (DELTASONE) 5 MG tablet, Take 5 mg by mouth daily.  , Disp: , Rfl: ;  warfarin (COUMADIN) 4 MG tablet, Take 4 mg by mouth daily.  , Disp: , Rfl:   No Known Allergies     Review of Systems  Constitutional: Negative for fever, chills, diaphoresis, appetite change and fatigue.  HENT: Negative for ear pain, sore throat, trouble swallowing, neck pain and ear discharge.  Eyes: Negative for photophobia, discharge and visual disturbance.  Respiratory: Negative for cough, choking, chest tightness and shortness of breath.   Cardiovascular: Negative for chest pain and palpitations.  Gastrointestinal: Positive for constipation. Negative for nausea, vomiting, abdominal pain, diarrhea, blood in stool, abdominal distention, anal bleeding and rectal pain.  Genitourinary: Positive for enuresis. Negative for dysuria, frequency, decreased urine volume, vaginal bleeding, vaginal discharge and difficulty urinating.  Musculoskeletal: Negative for myalgias and gait problem.  Skin: Negative for color change, pallor and rash.  Neurological: Negative for dizziness, speech difficulty, weakness and numbness.  Hematological: Negative for adenopathy.  Psychiatric/Behavioral: Negative for confusion  and agitation. The patient is not nervous/anxious.        Objective:   Physical Exam  Constitutional: She is oriented to person, place, and time. She appears well-developed and well-nourished. No distress.  HENT:  Head: Normocephalic.  Mouth/Throat: Oropharynx is clear and moist. No oropharyngeal exudate.  Eyes: Conjunctivae and EOM are normal. Pupils are equal, round, and reactive to light. No scleral icterus.  Neck: Normal range of motion and full passive range of motion without pain. No tracheal deviation present.       Severe cervical kyphosis  Cardiovascular: Normal rate and intact distal pulses.   Pulmonary/Chest: Effort normal. No respiratory distress. She exhibits no tenderness.  Abdominal: Soft. She exhibits no distension and no mass. There is no tenderness. There is no rebound and no guarding. Hernia confirmed negative in the right inguinal area and confirmed negative in the left inguinal area.       Incisions clean with normal healing ridges.  No hernias  Genitourinary: Vagina normal. No vaginal discharge found.       Perianal skin clean with good hygiene.  No pruritis.  No pilonidal disease.  No fissure.  No abscess/fistula.    Tolerates digital and anoscopic rectal exam.  Normal sphincter tone.  No rectal masses.  Firm, hard stool  Hemorrhoid R post > ant prolapsing/swelling. L lat WNL.  After confirming consent, the patient was positioned in the lateral decubitus position.  Perianal & rectal examination was done.  Using anoscopy, I re-ligated the hemorrhoids above the dentate line with banding x 2 (one for R ant & post piles).  The patient tolerated the procedure well.  Educational handouts further explaining the pathology, treatment options, and bowel regimen were given as well.     Musculoskeletal: Normal range of motion. She exhibits no tenderness.  Lymphadenopathy:       Right: No inguinal adenopathy present.       Left: No inguinal adenopathy present.  Neurological:  She is alert and oriented to person, place, and time. No cranial nerve deficit. She exhibits normal muscle tone. Coordination normal.  Skin: Skin is warm and dry. No rash noted. She is not diaphoretic.  Psychiatric: She has a normal mood and affect. Her behavior is normal.       Assessment:     Prolapsing hemorrhoids - rebanded     Plan:     Increase activity as tolerated.  Do not push through pain.  Advanced on diet as tolerated. Bowel regimen to avoid problems.  Start fiber regularly as previously recommended  Return to clinic one month. The patient expressed understanding and appreciation

## 2011-10-12 NOTE — Patient Instructions (Signed)

## 2011-10-18 ENCOUNTER — Ambulatory Visit (INDEPENDENT_AMBULATORY_CARE_PROVIDER_SITE_OTHER): Payer: Medicare Other

## 2011-10-18 ENCOUNTER — Encounter (HOSPITAL_COMMUNITY): Payer: Self-pay | Admitting: Emergency Medicine

## 2011-10-18 ENCOUNTER — Emergency Department (HOSPITAL_COMMUNITY)
Admission: EM | Admit: 2011-10-18 | Discharge: 2011-10-19 | Disposition: A | Discharge status: Home / Self Care | Payer: Medicare Other | Attending: Emergency Medicine | Admitting: Emergency Medicine

## 2011-10-18 DIAGNOSIS — R040 Epistaxis: Secondary | ICD-10-CM

## 2011-10-18 DIAGNOSIS — S81009A Unspecified open wound, unspecified knee, initial encounter: Secondary | ICD-10-CM

## 2011-10-18 DIAGNOSIS — D689 Coagulation defect, unspecified: Secondary | ICD-10-CM | POA: Insufficient documentation

## 2011-10-18 DIAGNOSIS — M810 Age-related osteoporosis without current pathological fracture: Secondary | ICD-10-CM | POA: Insufficient documentation

## 2011-10-18 DIAGNOSIS — D6832 Hemorrhagic disorder due to extrinsic circulating anticoagulants: Secondary | ICD-10-CM

## 2011-10-18 DIAGNOSIS — Z8739 Personal history of other diseases of the musculoskeletal system and connective tissue: Secondary | ICD-10-CM | POA: Insufficient documentation

## 2011-10-18 LAB — CBC
HCT: 29.7 % — ABNORMAL LOW (ref 36.0–46.0)
Hemoglobin: 9.7 g/dL — ABNORMAL LOW (ref 12.0–15.0)
MCV: 89.7 fL (ref 78.0–100.0)
RBC: 3.31 MIL/uL — ABNORMAL LOW (ref 3.87–5.11)
WBC: 6.9 10*3/uL (ref 4.0–10.5)

## 2011-10-18 LAB — DIFFERENTIAL
Eosinophils Relative: 1 % (ref 0–5)
Lymphocytes Relative: 20 % (ref 12–46)
Lymphs Abs: 1.4 10*3/uL (ref 0.7–4.0)
Monocytes Absolute: 0.8 10*3/uL (ref 0.1–1.0)
Monocytes Relative: 11 % (ref 3–12)
Neutro Abs: 4.6 10*3/uL (ref 1.7–7.7)

## 2011-10-18 MED ORDER — MORPHINE SULFATE 4 MG/ML IJ SOLN
INTRAMUSCULAR | Status: AC
  Administered 2011-10-18: 4 mg via INTRAMUSCULAR
  Filled 2011-10-18: qty 1

## 2011-10-18 MED ORDER — MORPHINE SULFATE 4 MG/ML IJ SOLN
4.0000 mg | Freq: Once | INTRAMUSCULAR | Status: AC
  Administered 2011-10-18: 4 mg via INTRAMUSCULAR

## 2011-10-18 MED ORDER — PHYTONADIONE 5 MG PO TABS
2.5000 mg | ORAL_TABLET | Freq: Once | ORAL | Status: AC
  Administered 2011-10-19: 2.5 mg via ORAL
  Filled 2011-10-18: qty 1

## 2011-10-18 MED ORDER — HYDROCODONE-ACETAMINOPHEN 5-325 MG PO TABS
1.0000 | ORAL_TABLET | Freq: Once | ORAL | Status: AC
  Filled 2011-10-18: qty 1

## 2011-10-18 NOTE — ED Provider Notes (Signed)
History     CSN: [id number]  Arrival date & time 10/18/11  2021   First MD Initiated Contact with Patient 10/18/11 2049      Chief Complaint  Patient presents with  . Epistaxis    (Consider location/radiation/quality/duration/timing/severity/associated sxs/prior treatment) Patient is a 78 y.o. female presenting with nosebleeds. The history is provided by the patient and a relative.  Epistaxis   Bleeding is from the left nostril. She had some slight bleeding from her nose yesterday and it got worse today. She was seen by a physician who inserted an object into her left nostril, but she had some bleeding at home. She's also complaining that she has difficulty breathing through her nose with the object in her nose. She is on Coumadin and thinks that her INR was checked within the last week, but does not know what it was.  Past Medical History  Diagnosis Date  . PMR (polymyalgia rheumatica)   . Temporal arteritis   . Osteoporosis   . Hearing loss   . Wears glasses   . Rectal bleeding   . Arthritis   . Rectal prolapse     Past Surgical History  Procedure Date  . Abdominal hysterectomy   . Appendectomy   . Colonoscopy   . Hernia repair     LIH  . Colon surgery 11/16/2010    Lap sigmoid colectomy & biologic mesh rectopexy    Family History  Problem Relation Age of Onset  . Cancer Brother     bladder    History  Substance Use Topics  . Smoking status: Never Smoker   . Smokeless tobacco: Not on file  . Alcohol Use: No    OB History    Grav Para Term Preterm Abortions TAB SAB Ect Mult Living                  Review of Systems  HENT: Positive for nosebleeds.   All other systems reviewed and are negative.    Allergies  Review of patient's allergies indicates no known allergies.  Home Medications   Current Outpatient Rx  Name Route Sig Dispense Refill  . CALCIUM + D PO Oral Take 750 mg by mouth 2 (two) times daily.      [name] DOCUSATE SODIUM 100 MG PO CAPS Oral  Take 100 mg by mouth 2 (two) times daily.      . DONEPEZIL HCL 5 MG PO TABS Oral Take 5 mg by mouth at bedtime as needed.      [name] ESTRACE 0.1 MG/GM VA CREA  Once a week.    [name] HYDROCODONE-ACETAMINOPHEN 5-500 MG PO TABS Oral Take 1 tablet by mouth every 6 (six) hours as needed.      [name] PREDNISONE 5 MG PO TABS Oral Take 5 mg by mouth daily.      . WARFARIN SODIUM 4 MG PO TABS Oral Take 4 mg by mouth daily.        BP 181/66  Pulse 92  Temp(Src) 97.7 F (36.5 C) (Oral)  Resp 18  Wt 108 lb 7 oz (49.187 kg)  SpO2 96%  Physical Exam  Nursing note and vitals reviewed.  78 year old female who is resting comfortably and in no acute distress. Vital signs are significant for moderate hypertension with blood pressure 181/66. Oxygen saturation is 96% which is normal. Head is normocephalic and atraumatic. There is a Rapid Rhino in the left nostril but no active bleeding seen. The balloon is not fully inflated. Oropharynx is  clear without any evidence of I am blood in the oropharynx. Neck is supple without adenopathy or JVD. Lungs are clear without rales, wheezes, rhonchi. Heart has regular rate and rhythm with occasional premature beat. There is a 3/6 systolic crescendo decrescendo murmur heard best at the base with radiation to the apex into the neck. Murmur is consistent with aortic stenosis. Abdomen is soft, flat, nontender without masses or visceromegaly. Extremities have no cyanosis or edema. Skin is warm and moist without rash. Neurologic: Mental status is normal, cranial nerves are intact, there no focal motor or sensory deficits.  ED Course  Procedures (including critical care time)  Labs Reviewed - No data to display No results found.  Results for orders placed during the hospital encounter of 10/18/11  CBC      Component Value Range   WBC 6.9  4.0 - 10.5 (K/uL)   RBC 3.31 (*) 3.87 - 5.11 (MIL/uL)   Hemoglobin 9.7 (*) 12.0 - 15.0 (g/dL)   HCT 40.9 (*) 81.1 - 46.0 (%)   MCV 89.7  78.0 - 100.0  (fL)   MCH 29.3  26.0 - 34.0 (pg)   MCHC 32.7  30.0 - 36.0 (g/dL)   RDW 91.4 (*) 78.2 - 15.5 (%)   Platelets 205  150 - 400 (K/uL)  DIFFERENTIAL      Component Value Range   Neutrophils Relative 67  43 - 77 (%)   Neutro Abs 4.6  1.7 - 7.7 (K/uL)   Lymphocytes Relative 20  12 - 46 (%)   Lymphs Abs 1.4  0.7 - 4.0 (K/uL)   Monocytes Relative 11  3 - 12 (%)   Monocytes Absolute 0.8  0.1 - 1.0 (K/uL)   Eosinophils Relative 1  0 - 5 (%)   Eosinophils Absolute 0.1  0.0 - 0.7 (K/uL)   Basophils Relative 0  0 - 1 (%)   Basophils Absolute 0.0  0.0 - 0.1 (K/uL)  PROTIME-INR      Component Value Range   Prothrombin Time 23.3 (*) 11.6 - 15.2 (seconds)   INR 2.03 (*) 0.00 - 1.49    No results found.   No diagnosis found.  She continues to have very minimal oozing around her Rapid Rhino. Her INR is in the lower end of the therapeutic range, but she is unlikely to have her bleeding stopped with just the Rapid Rhino. She will be given a dose of vitamin K tonight, and she'll be referred to Dr. Lazarus Salines for followup. She will be asked to stop her Coumadin for the next 5 days.  MDM  Epistaxis in the patient on warfarin. INR will be checked. I do not feel she will need any additional treatment and the Rapid Rhino, but she may need to be taken off of her warfarin for a short period of time in order to let the nose he'll.        Dione Booze, MD 10/18/11 [phone]

## 2011-10-18 NOTE — ED Notes (Signed)
ZOX:WRUE<AV> Expected date:10/18/11<BR> Expected time: 8:22 PM<BR> Means of arrival:Ambulance<BR> Comments:<BR> EMS 231 GC, 89 yof epistaxis

## 2011-10-18 NOTE — ED Notes (Signed)
Pt alert, c/o epistaxis, onset 1700 today, pt presents from Urgent Care, nasal packing to left nares, pt on Coumadin, resp even unlabored, tolerating oral secretions well

## 2011-10-19 NOTE — ED Notes (Signed)
Stable upon discharge.

## 2011-10-31 DIAGNOSIS — M5430 Sciatica, unspecified side: Secondary | ICD-10-CM | POA: Diagnosis present

## 2011-10-31 DIAGNOSIS — F0280 Dementia in other diseases classified elsewhere without behavioral disturbance: Secondary | ICD-10-CM | POA: Diagnosis present

## 2011-10-31 DIAGNOSIS — K219 Gastro-esophageal reflux disease without esophagitis: Secondary | ICD-10-CM | POA: Diagnosis present

## 2011-10-31 DIAGNOSIS — H9190 Unspecified hearing loss, unspecified ear: Secondary | ICD-10-CM | POA: Diagnosis present

## 2011-10-31 DIAGNOSIS — K228 Other specified diseases of esophagus: Secondary | ICD-10-CM | POA: Diagnosis present

## 2011-10-31 DIAGNOSIS — M353 Polymyalgia rheumatica: Secondary | ICD-10-CM | POA: Diagnosis present

## 2011-10-31 DIAGNOSIS — M4800 Spinal stenosis, site unspecified: Secondary | ICD-10-CM | POA: Diagnosis present

## 2011-10-31 DIAGNOSIS — Z888 Allergy status to other drugs, medicaments and biological substances status: Secondary | ICD-10-CM

## 2011-10-31 DIAGNOSIS — M810 Age-related osteoporosis without current pathological fracture: Secondary | ICD-10-CM | POA: Diagnosis present

## 2011-10-31 DIAGNOSIS — G309 Alzheimer's disease, unspecified: Secondary | ICD-10-CM | POA: Diagnosis present

## 2011-10-31 DIAGNOSIS — Z7982 Long term (current) use of aspirin: Secondary | ICD-10-CM

## 2011-10-31 DIAGNOSIS — I4891 Unspecified atrial fibrillation: Secondary | ICD-10-CM | POA: Diagnosis present

## 2011-10-31 DIAGNOSIS — K5909 Other constipation: Secondary | ICD-10-CM | POA: Diagnosis present

## 2011-10-31 DIAGNOSIS — M316 Other giant cell arteritis: Secondary | ICD-10-CM | POA: Diagnosis present

## 2011-10-31 DIAGNOSIS — I10 Essential (primary) hypertension: Secondary | ICD-10-CM | POA: Diagnosis present

## 2011-10-31 DIAGNOSIS — Z66 Do not resuscitate: Secondary | ICD-10-CM | POA: Diagnosis present

## 2011-10-31 DIAGNOSIS — Z681 Body mass index (BMI) 19 or less, adult: Secondary | ICD-10-CM

## 2011-10-31 DIAGNOSIS — K208 Other esophagitis without bleeding: Principal | ICD-10-CM | POA: Diagnosis present

## 2011-10-31 DIAGNOSIS — I059 Rheumatic mitral valve disease, unspecified: Secondary | ICD-10-CM | POA: Diagnosis present

## 2011-10-31 DIAGNOSIS — D62 Acute posthemorrhagic anemia: Secondary | ICD-10-CM | POA: Diagnosis present

## 2011-10-31 DIAGNOSIS — M129 Arthropathy, unspecified: Secondary | ICD-10-CM | POA: Diagnosis present

## 2011-10-31 DIAGNOSIS — K2289 Other specified disease of esophagus: Secondary | ICD-10-CM | POA: Diagnosis present

## 2011-10-31 DIAGNOSIS — Z23 Encounter for immunization: Secondary | ICD-10-CM

## 2011-10-31 DIAGNOSIS — R7989 Other specified abnormal findings of blood chemistry: Secondary | ICD-10-CM | POA: Diagnosis present

## 2011-10-31 DIAGNOSIS — Z79899 Other long term (current) drug therapy: Secondary | ICD-10-CM

## 2011-10-31 DIAGNOSIS — IMO0002 Reserved for concepts with insufficient information to code with codable children: Secondary | ICD-10-CM

## 2011-11-01 ENCOUNTER — Inpatient Hospital Stay (HOSPITAL_BASED_OUTPATIENT_CLINIC_OR_DEPARTMENT_OTHER)
Admission: EM | Admit: 2011-11-01 | Discharge: 2011-11-03 | DRG: 391 | Disposition: A | Discharge status: Home / Self Care | Payer: Medicare Other | Attending: Internal Medicine | Admitting: Internal Medicine

## 2011-11-01 ENCOUNTER — Encounter (HOSPITAL_BASED_OUTPATIENT_CLINIC_OR_DEPARTMENT_OTHER): Payer: Self-pay | Admitting: *Deleted

## 2011-11-01 DIAGNOSIS — K920 Hematemesis: Secondary | ICD-10-CM

## 2011-11-01 DIAGNOSIS — D689 Coagulation defect, unspecified: Secondary | ICD-10-CM | POA: Diagnosis present

## 2011-11-01 DIAGNOSIS — K5909 Other constipation: Secondary | ICD-10-CM

## 2011-11-01 DIAGNOSIS — I10 Essential (primary) hypertension: Secondary | ICD-10-CM

## 2011-11-01 DIAGNOSIS — I4891 Unspecified atrial fibrillation: Secondary | ICD-10-CM | POA: Diagnosis present

## 2011-11-01 DIAGNOSIS — D649 Anemia, unspecified: Secondary | ICD-10-CM | POA: Diagnosis present

## 2011-11-01 DIAGNOSIS — R011 Cardiac murmur, unspecified: Secondary | ICD-10-CM

## 2011-11-01 DIAGNOSIS — K2210 Ulcer of esophagus without bleeding: Secondary | ICD-10-CM | POA: Diagnosis not present

## 2011-11-01 DIAGNOSIS — K449 Diaphragmatic hernia without obstruction or gangrene: Secondary | ICD-10-CM

## 2011-11-01 DIAGNOSIS — K922 Gastrointestinal hemorrhage, unspecified: Secondary | ICD-10-CM | POA: Diagnosis present

## 2011-11-01 DIAGNOSIS — K648 Other hemorrhoids: Secondary | ICD-10-CM

## 2011-11-01 DIAGNOSIS — M1990 Unspecified osteoarthritis, unspecified site: Secondary | ICD-10-CM

## 2011-11-01 HISTORY — DX: Nonrheumatic mitral (valve) prolapse: I34.1

## 2011-11-01 HISTORY — DX: Spinal stenosis, site unspecified: M48.00

## 2011-11-01 HISTORY — DX: Essential (primary) hypertension: I10

## 2011-11-01 HISTORY — DX: Dementia in other diseases classified elsewhere, unspecified severity, without behavioral disturbance, psychotic disturbance, mood disturbance, and anxiety: F02.80

## 2011-11-01 HISTORY — DX: Unspecified atrial fibrillation: I48.91

## 2011-11-01 HISTORY — DX: Gastro-esophageal reflux disease without esophagitis: K21.9

## 2011-11-01 HISTORY — DX: Alzheimer's disease, unspecified: G30.9

## 2011-11-01 HISTORY — DX: Hyperlipidemia, unspecified: E78.5

## 2011-11-01 HISTORY — DX: Sciatica, unspecified side: M54.30

## 2011-11-01 LAB — CBC
HCT: 22.7 % — ABNORMAL LOW (ref 36.0–46.0)
HCT: 23.2 % — ABNORMAL LOW (ref 36.0–46.0)
HCT: 27.7 % — ABNORMAL LOW (ref 36.0–46.0)
Hemoglobin: 7.2 g/dL — ABNORMAL LOW (ref 12.0–15.0)
Hemoglobin: 7.4 g/dL — ABNORMAL LOW (ref 12.0–15.0)
Hemoglobin: 8.9 g/dL — ABNORMAL LOW (ref 12.0–15.0)
MCHC: 31.7 g/dL (ref 30.0–36.0)
MCHC: 32.1 g/dL (ref 30.0–36.0)
MCV: 88.9 fL (ref 78.0–100.0)
RDW: 18.2 % — ABNORMAL HIGH (ref 11.5–15.5)
WBC: 6 10*3/uL (ref 4.0–10.5)
WBC: 6.8 10*3/uL (ref 4.0–10.5)

## 2011-11-01 LAB — PREPARE RBC (CROSSMATCH)

## 2011-11-01 LAB — PREPARE FRESH FROZEN PLASMA

## 2011-11-01 LAB — COMPREHENSIVE METABOLIC PANEL
ALT: 45 U/L — ABNORMAL HIGH (ref 0–35)
Calcium: 7.9 mg/dL — ABNORMAL LOW (ref 8.4–10.5)
GFR calc Af Amer: >90 mL/min (ref >90)
Glucose, Bld: 95 mg/dL (ref 70–99)
Sodium: 139 mEq/L (ref 135–145)
Total Protein: 5.3 g/dL — ABNORMAL LOW (ref 6.0–8.3)

## 2011-11-01 LAB — PROTIME-INR
INR: 3.05 — ABNORMAL HIGH (ref 0.00–1.49)
Prothrombin Time: 30 seconds — ABNORMAL HIGH (ref 11.6–15.2)

## 2011-11-01 LAB — BASIC METABOLIC PANEL
BUN: 24 mg/dL — ABNORMAL HIGH (ref 6–23)
CO2: 27 mEq/L (ref 19–32)
Calcium: 8.5 mg/dL (ref 8.4–10.5)
Creatinine, Ser: 0.5 mg/dL (ref 0.50–1.10)
Glucose, Bld: 111 mg/dL — ABNORMAL HIGH (ref 70–99)

## 2011-11-01 LAB — TYPE AND SCREEN
Antibody Screen: NEGATIVE
Unit division: 0

## 2011-11-01 LAB — GLUCOSE, CAPILLARY
Glucose-Capillary: 95 mg/dL (ref 70–99)
Glucose-Capillary: 91 mg/dL (ref 70–99)
Glucose-Capillary: 160 mg/dL — ABNORMAL HIGH (ref 70–99)

## 2011-11-01 LAB — DIFFERENTIAL
Basophils Absolute: 0 10*3/uL (ref 0.0–0.1)
Eosinophils Relative: 1 % (ref 0–5)
Lymphocytes Relative: 19 % (ref 12–46)
Monocytes Absolute: 0.8 10*3/uL (ref 0.1–1.0)
Monocytes Relative: 11 % (ref 3–12)

## 2011-11-01 MED ORDER — ONDANSETRON HCL 4 MG/2ML IJ SOLN: 4.0000 mg | Freq: Three times a day (TID) | INTRAMUSCULAR | Status: DC | PRN

## 2011-11-01 MED ORDER — PNEUMOCOCCAL VAC POLYVALENT 25 MCG/0.5ML IJ INJ
0.5000 mL | INJECTION | INTRAMUSCULAR | Status: AC
  Filled 2011-11-01: qty 0.5

## 2011-11-01 MED ORDER — ACETAMINOPHEN 650 MG RE SUPP: 650.0000 mg | Freq: Four times a day (QID) | RECTAL | Status: DC | PRN

## 2011-11-01 MED ORDER — FAMOTIDINE IN NACL 20-0.9 MG/50ML-% IV SOLN
20.0000 mg | Freq: Once | INTRAVENOUS | Status: AC
  Administered 2011-11-01: 20 mg via INTRAVENOUS
  Filled 2011-11-01: qty 50

## 2011-11-01 MED ORDER — PANTOPRAZOLE SODIUM 40 MG IV SOLR
40.0000 mg | Freq: Two times a day (BID) | INTRAVENOUS | Status: DC
  Administered 2011-11-01 – 2011-11-02 (×3): 40 mg via INTRAVENOUS
  Filled 2011-11-01 (×4): qty 40

## 2011-11-01 MED ORDER — ONDANSETRON HCL 4 MG PO TABS: 4.0000 mg | ORAL_TABLET | Freq: Four times a day (QID) | ORAL | Status: DC | PRN

## 2011-11-01 MED ORDER — ACETAMINOPHEN 325 MG PO TABS
650.0000 mg | ORAL_TABLET | Freq: Four times a day (QID) | ORAL | Status: DC | PRN
  Administered 2011-11-01 – 2011-11-02 (×4): 650 mg via ORAL
  Filled 2011-11-01 (×4): qty 2

## 2011-11-01 MED ORDER — SODIUM CHLORIDE 0.9 % IV SOLN: INTRAVENOUS | Status: DC

## 2011-11-01 MED ORDER — HYDROCODONE-ACETAMINOPHEN 5-325 MG PO TABS
1.0000 | ORAL_TABLET | Freq: Once | ORAL | Status: AC
  Administered 2011-11-01: 1 via ORAL
  Filled 2011-11-01: qty 1

## 2011-11-01 MED ORDER — METHYLPREDNISOLONE SODIUM SUCC 40 MG IJ SOLR
20.0000 mg | Freq: Every day | INTRAMUSCULAR | Status: DC
  Administered 2011-11-01 – 2011-11-02 (×2): 20 mg via INTRAVENOUS
  Filled 2011-11-01 (×2): qty 0.5

## 2011-11-01 MED ORDER — SODIUM CHLORIDE 0.9 % IV SOLN
INTRAVENOUS | Status: AC
  Administered 2011-11-01: 02:00:00 via INTRAVENOUS

## 2011-11-01 MED ORDER — ONDANSETRON HCL 4 MG/2ML IJ SOLN: 4.0000 mg | Freq: Four times a day (QID) | INTRAMUSCULAR | Status: DC | PRN

## 2011-11-01 NOTE — H&P (Addendum)
Tammy Myers is an 78 y.o. female.   Patient Care Team:  Tammy Relic, MD as PCP - General (Internal Medicine)  Theda Belfast, MD as Consulting Physician (Gastroenterology)  Tammy Pons, MD as Consulting Physician (Cardiology)  Tammy Myers as Attending Physician (Obstetrics and Gynecology) Chief Complaint: Nausea and vomiting. HPI: 78 year-old female with history of atrial fibrillation on Coumadin, chronic anemia, polymyalgias rheumatica/temporal arteritis on steroids presented to the ER had [facility] after the patient had an episode of nausea and vomiting last night after she went to the bed. Patient states the vomitus was dark and reddish in color. Patient also states that she did take some dark jello before she went to bed. In the ER patient was found to hemoglobin around 7 and and a CBC done 10 days ago showed a hemoglobin around 9. Her INR was therapeutic. Rectal exam done by Dr.Hosmer did not have any stools. Patient will be admitted for anemia possibly from GI bleed. Patient has had a recent internal hemorrhoids banding. Patient denies any diarrhea abdominal pain. Denies any chest pain shortness of breath or dizziness. [facility] lives [facility].   Past Medical History  Diagnosis Date  . PMR (polymyalgia rheumatica)   . Temporal arteritis   . Osteoporosis   . Hearing loss   . Wears glasses   . Rectal bleeding   . Arthritis   . Rectal prolapse   . GERD (gastroesophageal reflux disease)   . Hypertension   . Mitral valve prolapse   . Atrial fibrillation   . Spinal stenosis   . Alzheimer's dementia   . Hyperlipidemia   . Sciatica     Past Surgical History  Procedure Date  . Abdominal hysterectomy   . Appendectomy   . Colonoscopy   . Hernia repair     LIH  . Colon surgery 11/16/2010    Lap sigmoid colectomy & biologic mesh rectopexy  . Breast surgery     Family History  Problem Relation Age of Onset  . Cancer Brother       bladder   Social History:  reports that she has never smoked. She does not have any smokeless tobacco history on file. She reports that she does not drink alcohol or use illicit drugs.  Allergies:  Allergies  Allergen Reactions  . Fosamax     Medications Prior to Admission  Medication Dose Route Frequency Provider Last Rate Last Dose  . 0.9 %  sodium chloride infusion   Intravenous STAT Nat Christen, MD 75 mL/hr at 11/01/11 0218    . 0.9 %  sodium chloride infusion   Intravenous Continuous Eduard Clos, MD      . acetaminophen (TYLENOL) tablet 650 mg  650 mg Oral Q6H PRN Eduard Clos, MD       Or  . acetaminophen (TYLENOL) suppository 650 mg  650 mg Rectal Q6H PRN Eduard Clos, MD      . famotidine (PEPCID) IVPB 20 mg  20 mg Intravenous Once Nat Christen, MD   20 mg at 11/01/11 0038  . HYDROcodone-acetaminophen (NORCO) 5-325 MG per tablet 1 tablet  1 tablet Oral Once Nat Christen, MD   1 tablet at 11/01/11 0148  . methylPREDNISolone sodium succinate (SOLU-MEDROL) 40 MG injection 20 mg  20 mg Intravenous Daily Eduard Clos, MD      . ondansetron [facility]) tablet 4 mg  4 mg Oral Q6H PRN Bulah Lurie N.  Toniann Fail, MD       Or  . ondansetron [facility]) injection 4 mg  4 mg Intravenous Q6H PRN Eduard Clos, MD      . pantoprazole (PROTONIX) injection 40 mg  40 mg Intravenous Q12H Eduard Clos, MD      . pneumococcal 23 valent vaccine (PNU-IMMUNE) injection 0.5 mL  0.5 mL Intramuscular Tomorrow-1000 Eduard Clos, MD      . DISCONTD: ondansetron (ZOFRAN) injection 4 mg  4 mg Intravenous Q8H PRN Nat Christen, MD       Medications Prior to Admission  Medication Sig Dispense Refill  . Calcium Carbonate-Vitamin D (CALCIUM + D PO) Take 750 mg by mouth 2 (two) times daily.        [name] docusate sodium (COLACE) 100 MG capsule Take 100 mg by mouth 2 (two) times daily.        [name] donepezil (ARICEPT) 5 MG tablet Take 5 mg by mouth at  bedtime as needed.        [name] HYDROcodone-acetaminophen (VICODIN) 5-500 MG per tablet Take 1 tablet by mouth every 6 (six) hours as needed.        . predniSONE (DELTASONE) 5 MG tablet Take 5 mg by mouth daily.        [name] warfarin (COUMADIN) 4 MG tablet Take 4 mg by mouth daily.        [name] ESTRACE VAGINAL 0.1 MG/GM vaginal cream Once a week.        Results for orders placed during the hospital encounter of 11/01/11 (from the past 48 hour(s))  CBC     Status: Abnormal   Collection Time   11/01/11 12:30 AM      Component Value Range Comment   WBC 6.8  4.0 - 10.5 (K/uL)    RBC 2.61 (*) 3.87 - 5.11 (MIL/uL)    Hemoglobin 7.4 (*) 12.0 - 15.0 (g/dL)    HCT 10.9 (*) 60.4 - 46.0 (%)    MCV 88.9  78.0 - 100.0 (fL)    MCH 28.4  26.0 - 34.0 (pg)    MCHC 31.9  30.0 - 36.0 (g/dL)    RDW 54.0 (*) 98.1 - 15.5 (%)    Platelets 253  150 - 400 (K/uL)   DIFFERENTIAL     Status: Normal   Collection Time   11/01/11 12:30 AM      Component Value Range Comment   Neutrophils Relative 69  43 - 77 (%)    Neutro Abs 4.7  1.7 - 7.7 (K/uL)    Lymphocytes Relative 19  12 - 46 (%)    Lymphs Abs 1.3  0.7 - 4.0 (K/uL)    Monocytes Relative 11  3 - 12 (%)    Monocytes Absolute 0.8  0.1 - 1.0 (K/uL)    Eosinophils Relative 1  0 - 5 (%)    Eosinophils Absolute 0.1  0.0 - 0.7 (K/uL)    Basophils Relative 0  0 - 1 (%)    Basophils Absolute 0.0  0.0 - 0.1 (K/uL)   BASIC METABOLIC PANEL     Status: Abnormal   Collection Time   11/01/11 12:30 AM      Component Value Range Comment   Sodium 137  135 - 145 (mEq/L)    Potassium 3.7  3.5 - 5.1 (mEq/L)    Chloride 102  96 - 112 (mEq/L)    CO2 27  19 - 32 (mEq/L)    Glucose, Bld 111 (*) 70 -  99 (mg/dL)    BUN 24 (*) 6 - 23 (mg/dL)    Creatinine, Ser 4.54  0.50 - 1.10 (mg/dL)    Calcium 8.5  8.4 - 10.5 (mg/dL)    GFR calc non Af Amer 84 (*) >90 (mL/min)    GFR calc Af Amer >90  >90 (mL/min)   PROTIME-INR     Status: Abnormal   Collection Time   11/01/11 12:30 AM       Component Value Range Comment   Prothrombin Time 30.0 (*) 11.6 - 15.2 (seconds)    INR 2.81 (*) 0.00 - 1.49    APTT     Status: Abnormal   Collection Time   11/01/11 12:30 AM      Component Value Range Comment   aPTT 48 (*) 24 - 37 (seconds)    No results found.  Review of Systems  Constitutional: Negative.   HENT: Negative.   Eyes: Negative.   Respiratory: Negative.   Cardiovascular: Negative.   Gastrointestinal: Positive for nausea and vomiting.  Genitourinary: Negative.   Musculoskeletal: Negative.   Skin: Negative.   Neurological: Negative.   Endo/Heme/Allergies: Negative.   Psychiatric/Behavioral: Negative.     Blood pressure 164/74, pulse 78, temperature 98 F (36.7 C), temperature source Oral, resp. rate 16, height 5\' 6"  (1.676 m), weight 48.353 kg (106 lb 9.6 oz), SpO2 94.00%. Physical Exam  Constitutional: She is oriented to person, place, and time. She appears well-developed and well-nourished. No distress.  HENT:  Head: Normocephalic and atraumatic.  Right Ear: External ear normal.  Left Ear: External ear normal.  Nose: Nose normal.  Mouth/Throat: Oropharynx is clear and moist. No oropharyngeal exudate.  Eyes: Pupils are equal, round, and reactive to light. Right eye exhibits no discharge. Left eye exhibits no discharge.       Pallor.  Neck: Normal range of motion. Neck supple.  Cardiovascular: Normal rate, regular rhythm and normal heart sounds.   Respiratory: Effort normal and breath sounds normal. No respiratory distress. She has no wheezes. She has no rales.  GI: Soft. Bowel sounds are normal. She exhibits no distension. There is no tenderness. There is no rebound.  Musculoskeletal: Normal range of motion. She exhibits no edema and no tenderness.  Neurological: She is alert and oriented to person, place, and time.       Moves upper and lower extremities.  Skin: Skin is warm and dry. No rash noted. She is not diaphoretic. No erythema.  Psychiatric: Her  behavior is normal.     Assessment/Plan #1. Anemia most probably from GI bleed - at this time I have ordered one unit of packed red blood cells. We will hold off her Coumadin and patient be kept n.p.o. Patient is on chronic steroids so there is unlikely chance patient may be having an upper GI bleed. Will keep patient on Protonix IV and consult patient's gastroenterologist Dr. Elnoria Howard. Recheck CBC after transfusion. We'll also check LFTs. We'll transfuse one unit of FFP. #2. Atrial fibrillation rate controlled on Coumadin - hold Coumadin for now. Follow PT/INR. #3. History of polymyalgia rheumatica/temporal arteritis on chronic steroids - hold off on oral prednisone and I have ordered small dose of IV Solu-Medrol until patient can take by mouth prednisone. #4. History of internal hemorrhoids which had recent banding done.  CODE STATUS - DO NOT RESUSCITATE.  Longino Trefz N. 11/01/2011, 4:44 AM

## 2011-11-01 NOTE — ED Notes (Signed)
Pt has an area of swelling/redness to left lateral malleolus. Pt reports it has been there since 2 weeks ago. It has been evaluated before, and pt states she has finished a round of antibiotics already, without improvement in appearance. Pt states her PMD is aware.

## 2011-11-01 NOTE — Progress Notes (Signed)
Clinical Social Worker received referral for patient placement needs.  Patient states that she is from [facility] and plans to return at discharge.  Patient has transportation home with sister at discharge.  Inappropriate social work referral at this time.  Clinical Social Worker will sign off for now as social work intervention is no longer needed. Please consult [place] again if new need arises.  [address] [place], [place] [phone]

## 2011-11-01 NOTE — ED Notes (Addendum)
Pt is from [facility]. Family reports pt had one episode of vomiting tonight that had bright red blood in it. Pt had a nosebleed appprox 1 week ago that was cauterized. Pt is on coumadin.

## 2011-11-01 NOTE — ED Notes (Signed)
Transport consent form signed. [facility] en route.

## 2011-11-01 NOTE — ED Notes (Signed)
Pt denies complaints at this time. C/o one episode of nausea with vomiting, but symptoms improved immediately following.

## 2011-11-01 NOTE — ED Notes (Signed)
Called Danielle with [facility] to inform of room assignment

## 2011-11-01 NOTE — Progress Notes (Signed)
Utilization review completed. Tammy Myers 11/01/2011 

## 2011-11-01 NOTE — Consult Note (Addendum)
[facility] Consult: 12:15 PM 11/01/2011   Referring Provider: Waymon Amato Primary Care Physician:  [name], MD, MD Primary Gastroenterologist:  Dr. Elnoria Howard, saw as inpt in 2010.  Reason for Consultation:    HPI:  Tammy Myers is a 78 y.o. female .  Has PMR and temporal arteritis treated with chronic steroids, chronic anemia.  Admitted last night from [facility] ED with dark, reddish vomitting. Only one episode that woke her up at about 10 PM.  No stool to test on rectal exam in ED.  She has not had repeat emesis or stools since admission.    On 10/11/10 Dr Michaell Cowing banded internal hemorrhoids.  Hgb 13.4 on Sep 21, 2011,  9.7 on Oct 18, 2011.  It is currently 7.4.  She had severe epistaxis requiring nasal packing in ED on 10/18/11.  Follow up with Dr Lazarus Salines about 5 days later at which time the nose was cauterized.  She was given Vitamin K and Coumadin was held for several days, but has since been restarted. INR was 2.03 on 10/18/11.  In Feb 2012 she had laparoscopic sigmoid colectomy, rectopexy with biologic mesh reinforcement to address rectal prolapse.    No records of colonoscopy or egd found in records. She takes Coumadin for hx of a fib.  INR is currently 2.8.      GI wise has chronic constipation from daily narcotics.  Manages with stool softeners.    Past Medical History  Diagnosis Date  . PMR (polymyalgia rheumatica)   . Temporal arteritis   . Osteoporosis   . Hearing loss   . Wears glasses   . Rectal bleeding   . Arthritis   . Rectal prolapse   . GERD (gastroesophageal reflux disease)   . Hypertension   . Mitral valve prolapse   . Atrial fibrillation   . Spinal stenosis   . Alzheimer's dementia   . Hyperlipidemia   . Sciatica     Past Surgical History  Procedure Date  . Abdominal hysterectomy   . Appendectomy   . Colonoscopy   . Hernia repair     LIH  . Colon surgery 11/16/2010    Lap sigmoid colectomy & biologic mesh  rectopexy  . Breast surgery     Prior to Admission medications   Medication Sig Start Date End Date Taking? Authorizing Provider  Calcium Carbonate-Vitamin D (CALCIUM + D PO) Take 750 mg by mouth 2 (two) times daily.     Yes Historical Provider, MD  docusate sodium (COLACE) 100 MG capsule Take 100 mg by mouth 2 (two) times daily.     Yes Historical Provider, MD  donepezil (ARICEPT) 5 MG tablet Take 5 mg by mouth at bedtime as needed.     Yes Historical Provider, MD  ESTRACE VAGINAL 0.1 MG/GM vaginal cream Once a week. 08/16/11  Yes Historical Provider, MD  HYDROcodone-acetaminophen (VICODIN) 5-500 MG per tablet Take 1 tablet by mouth every 6 (six) hours as needed. For pain   Yes Historical Provider, MD  predniSONE (DELTASONE) 5 MG tablet Take 5 mg by mouth daily.     Yes Historical Provider, MD  warfarin (COUMADIN) 4 MG tablet Take 4 mg by mouth daily.     Yes Historical Provider, MD    Scheduled Meds:    . sodium chloride   Intravenous STAT  . famotidine (PEPCID) IV  20 mg Intravenous Once  . HYDROcodone-acetaminophen  1 tablet Oral Once  . methylPREDNISolone (SOLU-MEDROL) injection  20 mg Intravenous Daily  .  pantoprazole (PROTONIX) IV  40 mg Intravenous Q12H  . pneumococcal 23 valent vaccine  0.5 mL Intramuscular Tomorrow-1000   Infusions:    . sodium chloride     PRN Meds: acetaminophen, acetaminophen, ondansetron (ZOFRAN) IV, ondansetron, DISCONTD: ondansetron (ZOFRAN) IV   Allergies as of 10/31/2011  . (Not on File)    Family History  Problem Relation Age of Onset  . Cancer Brother     bladder        She is a triplett, her brother died, her sister is living.   History   Social History  . Marital Status: Widowed    Spouse Name: N/A    Number of Children: N/A  . Years of Education: N/A   Occupational History  . Not on file.   Social History Main Topics  . Smoking status: Never Smoker   . Smokeless tobacco: Not on file  . Alcohol Use: No  . Drug Use: No    . Sexually Active:    Other Topics Concern  . Not on file   Social History Narrative  . No narrative on file    REVIEW OF SYSTEMS: Constitutional:  No new weakness.  Wt loss, of unclear amount, over many years.  ENT:  Nose bleed as above CV:  No chest pain GI:  No dysphagia, reflux.  Positive for constipation Transfusions:  None Neuro:  No head ache or seizure.  B hearing aids.  Some memory loss but no severe confusion or disorientation. Derm:  No rash or itching. GU:  No dysuria or hematuria Immunization:  Flu vaccine current. Travel:  None Muscskeletal: chronic back pain  PHYSICAL EXAM: Vital signs in last 24 hours: Temp:  [97.7 F (36.5 C)-98.7 F (37.1 C)] 98.7 F (37.1 C) (01/23 1130) Pulse Rate:  [72-88] 80  (01/23 1130) Resp:  [16-20] 18  (01/23 1130) BP: (137-174)/(61-77) 143/61 mmHg (01/23 1130) SpO2:  [94 %-100 %] 94 % (01/23 0313) Weight:  [47.628 kg (105 lb)-48.353 kg (106 lb 9.6 oz)] 48.353 kg (106 lb 9.6 oz) (01/23 0313)   Last BM Date: 10/31/11  General: Thin, aged WF.  Not acutely ill looking Head:  No signs of trauma  Eyes:  Conj pallor is present Ears:  B hearing aids.  Nose:  No discharge or bleeding Mouth:  Good native dentition.  Neck:  No thyromegaly, prominent parotid glands B. Lungs:  Clear.  No cough.  No dyspnea. Heart: Irreg, Irreg with controlled rate.  Soft systolic murmer. Abdomen:  Soft, NT, ND.  No mass or HSM.   Rectal: empty.  No masses.  No blood.  Small hemorrhoids are benign looking   Musc/Skeltl: scoliosis Extremities:  No edema  Neurologic:  Oriented x 3.  Moves all 4's .  Sits up easily.  Not agitated or confused Skin:  Thin, dry Tattoos:  none Nodes:  None at neck or groin.   Psych:  Pleasant, cooperative.  Intake/Output from previous day: 01/22 0701 - 01/23 0700 In: 599 [I.V.:250; Blood:349] Out: -  Intake/Output this shift: Total I/O In: 597.5 [I.V.:250; Blood:347.5] Out: -   LAB RESULTS:  Basename  11/01/11 0430 11/01/11 0030  WBC 6.0 6.8  HGB 7.2* 7.4*  HCT 22.7* 23.2*  PLT 243 253   BMET Lab Results  Component Value Date   NA 139 11/01/2011   NA 137 11/01/2011   NA 136 11/21/2010   K 3.6 11/01/2011   K 3.7 11/01/2011   K 3.4* 11/21/2010   CL 105 11/01/2011  CL 102 11/01/2011   CL 101 11/21/2010   CO2 27 11/01/2011   CO2 27 11/01/2011   CO2 26 11/21/2010   GLUCOSE 95 11/01/2011   GLUCOSE 111* 11/01/2011   GLUCOSE 103* 11/21/2010   BUN 20 11/01/2011   BUN 24* 11/01/2011   BUN 13 11/21/2010   CREATININE 0.46* 11/01/2011   CREATININE 0.50 11/01/2011   CREATININE 0.55 11/21/2010   CALCIUM 7.9* 11/01/2011   CALCIUM 8.5 11/01/2011   CALCIUM 8.9 11/21/2010   LFT Lab Results  Component Value Date   PROT 5.3* 11/01/2011   PROT 6.0 09/18/2009   PROT 6.3 09/17/2009   ALBUMIN 2.3* 11/01/2011   ALBUMIN 3.3* 09/18/2009   ALBUMIN 3.6 09/17/2009   AST 54* 11/01/2011   AST 57* 09/18/2009   AST 47* 09/17/2009   ALT 45* 11/01/2011   ALT 39* 09/18/2009   ALT 40* 09/17/2009   ALKPHOS 547* 11/01/2011   ALKPHOS 200* 09/18/2009   ALKPHOS 212* 09/17/2009   BILITOT 0.9 11/01/2011   BILITOT 0.5 09/18/2009   BILITOT 0.4 09/17/2009   PT/INR Lab Results  Component Value Date   INR 3.05* 11/01/2011   INR 2.81* 11/01/2011   INR 2.03* 10/18/2011   ENDOSCOPIC STUDIES: No echart, no cori, no epic reports.  IMPRESSION: 1.  Isolated single episode of emesis with streaks of blood.  Resolved nausea.  No melena.  2.  Anemia.  multifactorial given recent epistaxis requiring nasal packing followed by cauterization.  The drop in H & H has transpired since CBC obtained on ed visit for nose bleed.  I suspect some, if not most of the loss comes from the nasal losses.   3.  Chronic Coumadin, INR therapeutic.  Hx a fib. 4.  Epistaxis.  Packed around Jan 8th, cauterized around Jan 14th.  No recurrence.  5.  PMR:  Chronic low dose steroids.  6.  S/P repair rectal prolapse one year ago.  Banding of internal hemorrhoids  on 10/12/11. 7.  Chronic constipation from chronic narcotics.   PLAN: 1.  EGD when INR normalizes, tomorrow?  Clear liquids until then.  2.  Transfuse 2 PRBCs, as ordered. 3.  Correct coags.  FFP ordered.  Should we give a dose of Vitamin K?  Need to ask cardiologist if Coumadin can be permanently "retired" given recent complications.  Ordered PT/INR for AM. 4.  IV Protonix, currently BID dosing:  Continue.    LOS: 0 days   Jennye Moccasin  11/01/2011, 12:15 PM Pager: [phone]  GI ATTENDING.   PATIENT SEEN AND EXAMINED. LABS , OLD RECORDS REVIEWED. AGREE W/ H/P AS ABOVE. MULTIPLE PROBLEMS. ON COUMADIN. ADMITTED WITH MINOR HEMATEMESIS. MODERATE ANEMIA. HX EPISTAXIS. NO GROSS BLEEDING. ? ETIOLOGY (NOSE VS UGI). RECOMMEND PPI, TRANSFUSE BLOOD AS NEEDED, AND EGD TOMORROW . HIGH RISK PATIENT.The nature of the procedure, as well as the risks, benefits, and alternatives were carefully and thoroughly reviewed with the patient. Ample time for discussion and questions allowed. The patient understood, was satisfied, and agreed to proceed.   Wilhemina Bonito. Eda Keys., M.D. [facility] Division of Gastroenterology       NO INTERVAL CHANGE. EXAM AS ABOVE. READY FOR EGD.

## 2011-11-01 NOTE — Progress Notes (Signed)
Subjective: Chart reviewed. Interviewed patient and her family including brother-in-law Mr. Quay Burow and sister Ms. [name]. Patient denies any complaints. According to family patient had dinner approximately 5 PM last night. At approximately 11 PM she had an episode of vomiting consisting of clear liquids with some bright red blood. There was no coffee grounds. She can't tell when she last had a BM or the color of stools. Unable to obtain when she last had endoscopies. She's never had blood transfusions. No history of NSAID use. Denies abdominal pain.  Had history of nose bleeds approximately 10 days ago which was cauterized and has not had it since. Recently had rebanding of prolapsed hemorrhoids on third of January 2013 by the surgeons. She has been on Coumadin for a couple of years for afebrile. They indicate that she does not have a primary cardiologist or gastroenterologist.  Objective: Blood pressure 143/61, pulse 80, temperature 98.7 F (37.1 C), temperature source Oral, resp. rate 18, height 5\' 6"  (1.676 m), weight 48.353 kg (106 lb 9.6 oz), SpO2 94.00%.  Intake/Output Summary (Last 24 hours) at 11/01/11 1212 Last data filed at 11/01/11 1200  Gross per 24 hour  Intake 1196.5 ml  Output      0 ml  Net 1196.5 ml    General Exam: Comfortable. Frail looking. Respiratory System: Clear. No increased work of breathing. Cardiovascular System: First and second heart sounds heard. Regular rate and rhythm. No JVD/murmurs. Telemetry shows sinus rhythm. Gastrointestinal System: Abdomen is mildly distended distended, soft and normal bowel sounds heard. Nontender. Central Nervous System: Alert and oriented to self and partly to place. No focal neurological deficits.  Lab Results: Basic Metabolic Panel:  Basename 11/01/11 0430 11/01/11 0030  NA 139 137  K 3.6 3.7  CL 105 102  CO2 27 27  GLUCOSE 95 111*  BUN 20 24*  CREATININE 0.46* 0.50  CALCIUM 7.9* 8.5  MG -- --  PHOS -- --    Liver Function Tests:  Basename 11/01/11 0430  AST 54*  ALT 45*  ALKPHOS 547*  BILITOT 0.9  PROT 5.3*  ALBUMIN 2.3*   No results found for this basename: LIPASE:2,AMYLASE:2 in the last 72 hours No results found for this basename: AMMONIA:2 in the last 72 hours CBC:  Basename 11/01/11 0430 11/01/11 0030  WBC 6.0 6.8  NEUTROABS -- 4.7  HGB 7.2* 7.4*  HCT 22.7* 23.2*  MCV 91.5 88.9  PLT 243 253   Cardiac Enzymes: No results found for this basename: CKTOTAL:3,CKMB:3,CKMBINDEX:3,TROPONINI:3 in the last 72 hours BNP: No results found for this basename: PROBNP:3 in the last 72 hours D-Dimer: No results found for this basename: DDIMER:2 in the last 72 hours CBG:  Basename 11/01/11 1136 11/01/11 0740  GLUCAP 91 95   Hemoglobin A1C: No results found for this basename: HGBA1C in the last 72 hours Fasting Lipid Panel: No results found for this basename: CHOL,HDL,LDLCALC,TRIG,CHOLHDL,LDLDIRECT in the last 72 hours Thyroid Function Tests: No results found for this basename: TSH,T4TOTAL,FREET4,T3FREE,THYROIDAB in the last 72 hours Anemia Panel: No results found for this basename: VITAMINB12,FOLATE,FERRITIN,TIBC,IRON,RETICCTPCT in the last 72 hours Coagulation:  Basename 11/01/11 0430 11/01/11 0030  LABPROT 32.0* 30.0*  INR 3.05* 2.81*   Urine Drug Screen: Drugs of Abuse  No results found for this basename: labopia,  cocainscrnur,  labbenz,  amphetmu,  thcu,  labbarb    Alcohol Level: No results found for this basename: ETH:2 in the last 72 hours Micro Results: No results found for this or any previous  visit (from the past 240 hour(s)).  Studies/Results: No results found.  Medications: Scheduled Meds:    . sodium chloride   Intravenous STAT  . famotidine (PEPCID) IV  20 mg Intravenous Once  . HYDROcodone-acetaminophen  1 tablet Oral Once  . methylPREDNISolone (SOLU-MEDROL) injection  20 mg Intravenous Daily  . pantoprazole (PROTONIX) IV  40 mg Intravenous Q12H   . pneumococcal 23 valent vaccine  0.5 mL Intramuscular Tomorrow-1000   Continuous Infusions:    . sodium chloride     PRN Meds:.acetaminophen, acetaminophen, ondansetron (ZOFRAN) IV, ondansetron, DISCONTD: ondansetron (ZOFRAN) IV  Assessment/Plan: 1. Possible upper GI bleed, in the context of anticoagulation: Single episode of vomiting up some bright red blood. Provide clear liquids and continue intravenous proton pump inhibitors. Dr. Elnoria Howard called to indicate that he is not her primary gastroenterologist. [facility] has thereby been consulted for further evaluation. Coumadin currently on hold. 2. Acute on chronic anemia:? Secondary to blood loss from prior epistaxis and current GI bleed. Agree with transfusing 1 unit of packed red blood cells and following CBCs closely. 3. Atrial fibrillation: Currently in sinus rhythm. Coumadin on hold. Not on any rate control medications at home. 4. Abnormal liver function tests: Unclear etiology. Asymptomatic. Follow daily CMP. 5. History of polymyalgia rheumatica/temporal arteritis: Continue steroids 6. Alzheimer's dementia: Mental status probably at baseline. 7. Hypertension: Controlled  Discuss patient's care at length with her family.   HONGALGI,ANAND 11/01/2011, 12:12 PM

## 2011-11-01 NOTE — ED Notes (Signed)
Family reports the patient coughed up 'good amount of blood' around 2300 last night.

## 2011-11-01 NOTE — ED Notes (Signed)
Pt ambulatory to restroom with 1 assist. Pt aware of plans for admission. Verbalized understanding.

## 2011-11-01 NOTE — ED Provider Notes (Addendum)
History     CSN: [id number]  Arrival date & time 10/31/11  2359   First MD Initiated Contact with Patient 11/01/11 0010      Chief Complaint  Patient presents with  . Hemoptysis    (Consider location/radiation/quality/duration/timing/severity/associated sxs/prior treatment) HPI Comments: Patient presents from her independent living apartment with her sister and her sister's husband complaining of one episode of emesis that appeared to be food mixed with blood on my report from the sister.  The patient herself has some dementia and so is able to give some of the history but not all of it.  Despite the triage report notes a "good amount of blood" they tell me that she had food with some blood mixed in.  Patient is on Coumadin.  She has not noted any rectal bleeding or black stools.  She denies any abdominal pain, lightheadedness, dizziness, chest pain or shortness of breath.  Patient states she otherwise feels well.  She states she simply woke up and felt the urge to vomit and vomited and now feels better.  She was sent over here because the staff at the [facility] felt that she should be medically evaluated.  Patient is a 78 y.o. female presenting with vomiting. The history is provided by the patient and a relative.  Emesis  This is a new problem. The current episode started less than 1 hour ago. The emesis has an appearance of stomach contents. There has been no fever. Pertinent negatives include no abdominal pain, no arthralgias, no chills, no cough, no diarrhea, no fever, no headaches, no myalgias, no sweats and no URI.    Past Medical History  Diagnosis Date  . PMR (polymyalgia rheumatica)   . Temporal arteritis   . Osteoporosis   . Hearing loss   . Wears glasses   . Rectal bleeding   . Arthritis   . Rectal prolapse   . GERD (gastroesophageal reflux disease)   . Hypertension   . Mitral valve prolapse   . Atrial fibrillation   . Spinal stenosis   . Alzheimer's dementia   .  Hyperlipidemia   . Sciatica     Past Surgical History  Procedure Date  . Abdominal hysterectomy   . Appendectomy   . Colonoscopy   . Hernia repair     LIH  . Colon surgery 11/16/2010    Lap sigmoid colectomy & biologic mesh rectopexy  . Breast surgery     Family History  Problem Relation Age of Onset  . Cancer Brother     bladder    History  Substance Use Topics  . Smoking status: Never Smoker   . Smokeless tobacco: Not on file  . Alcohol Use: No    OB History    Grav Para Term Preterm Abortions TAB SAB Ect Mult Living                  Review of Systems  Constitutional: Negative.  Negative for fever and chills.  HENT: Negative.   Eyes: Negative.  Negative for discharge and redness.  Respiratory: Negative.  Negative for cough and shortness of breath.   Cardiovascular: Negative.  Negative for chest pain.  Gastrointestinal: Positive for nausea and vomiting. Negative for abdominal pain and diarrhea.  Genitourinary: Negative.  Negative for dysuria and vaginal discharge.  Musculoskeletal: Negative.  Negative for myalgias, back pain and arthralgias.  Skin: Negative.  Negative for color change and rash.  Neurological: Negative.  Negative for syncope and headaches.  Hematological:  Negative.  Negative for adenopathy.  Psychiatric/Behavioral: Negative.  Negative for confusion.  All other systems reviewed and are negative.    Allergies  Fosamax  Home Medications   Current Outpatient Rx  Name Route Sig Dispense Refill  . CALCIUM + D PO Oral Take 750 mg by mouth 2 (two) times daily.      [name] DOCUSATE SODIUM 100 MG PO CAPS Oral Take 100 mg by mouth 2 (two) times daily.      . DONEPEZIL HCL 5 MG PO TABS Oral Take 5 mg by mouth at bedtime as needed.      [name] HYDROCODONE-ACETAMINOPHEN 5-500 MG PO TABS Oral Take 1 tablet by mouth every 6 (six) hours as needed.      [name] PREDNISONE 5 MG PO TABS Oral Take 5 mg by mouth daily.      . WARFARIN SODIUM 4 MG PO TABS Oral Take 4 mg by  mouth daily.      [name] ESTRACE 0.1 MG/GM VA CREA  Once a week.      BP 142/66  Pulse 80  Temp(Src) 98.7 F (37.1 C) (Oral)  Resp 20  Ht 5\' 3"  (1.6 m)  Wt 105 lb (47.628 kg)  BMI 18.60 kg/m2  SpO2 95%  Physical Exam  Nursing note and vitals reviewed. Constitutional: She is oriented to person, place, and time. She appears well-developed and well-nourished.  Non-toxic appearance. She does not have a sickly appearance.  HENT:  Head: Normocephalic and atraumatic.  Eyes: Conjunctivae, EOM and lids are normal. Pupils are equal, round, and reactive to light. No scleral icterus.  Neck: Trachea normal and normal range of motion. Neck supple.  Cardiovascular: Normal rate, regular rhythm and normal heart sounds.   Pulmonary/Chest: Effort normal and breath sounds normal.  Abdominal: Soft. Normal appearance. She exhibits no distension. There is no tenderness. There is no rebound, no guarding and no CVA tenderness.  Genitourinary: Guaiac negative stool.       Rectal exam demonstrates normal tone and no significant stool in the vault and no signs of any bleeding or melena  Musculoskeletal: Normal range of motion.  Neurological: She is alert and oriented to person, place, and time. She has normal strength.  Skin: Skin is warm, dry and intact. No rash noted.  Psychiatric: She has a normal mood and affect. Her behavior is normal. Judgment and thought content normal.    ED Course  Procedures (including critical care time)  Results for orders placed during the hospital encounter of 11/01/11  CBC      Component Value Range   WBC 6.8  4.0 - 10.5 (K/uL)   RBC 2.61 (*) 3.87 - 5.11 (MIL/uL)   Hemoglobin 7.4 (*) 12.0 - 15.0 (g/dL)   HCT 40.9 (*) 81.1 - 46.0 (%)   MCV 88.9  78.0 - 100.0 (fL)   MCH 28.4  26.0 - 34.0 (pg)   MCHC 31.9  30.0 - 36.0 (g/dL)   RDW 91.4 (*) 78.2 - 15.5 (%)   Platelets 253  150 - 400 (K/uL)  DIFFERENTIAL      Component Value Range   Neutrophils Relative 69  43 - 77 (%)    Neutro Abs 4.7  1.7 - 7.7 (K/uL)   Lymphocytes Relative 19  12 - 46 (%)   Lymphs Abs 1.3  0.7 - 4.0 (K/uL)   Monocytes Relative 11  3 - 12 (%)   Monocytes Absolute 0.8  0.1 - 1.0 (K/uL)   Eosinophils Relative 1  0 -  5 (%)   Eosinophils Absolute 0.1  0.0 - 0.7 (K/uL)   Basophils Relative 0  0 - 1 (%)   Basophils Absolute 0.0  0.0 - 0.1 (K/uL)  BASIC METABOLIC PANEL      Component Value Range   Sodium 137  135 - 145 (mEq/L)   Potassium 3.7  3.5 - 5.1 (mEq/L)   Chloride 102  96 - 112 (mEq/L)   CO2 27  19 - 32 (mEq/L)   Glucose, Bld 111 (*) 70 - 99 (mg/dL)   BUN 24 (*) 6 - 23 (mg/dL)   Creatinine, Ser 1.61  0.50 - 1.10 (mg/dL)   Calcium 8.5  8.4 - 09.6 (mg/dL)   GFR calc non Af Amer 84 (*) >90 (mL/min)   GFR calc Af Amer >90  >90 (mL/min)  PROTIME-INR      Component Value Range   Prothrombin Time 30.0 (*) 11.6 - 15.2 (seconds)   INR 2.81 (*) 0.00 - 1.49   APTT      Component Value Range   aPTT 48 (*) 24 - 37 (seconds)        MDM  Patient presents with possible GI bleed given the story.  She is on Coumadin but is not supratherapeutic at this time.  She has no significant signs of bleeding on her rectal exam but I also did not have significant stool on the exam.  Patient's hemoglobin is now 7.4 which is a decrease from January 9 when the patient was 9.7.  Given the significant decrease it with the patient does warrant transfer for admission for blood transfusion and further observation for GI bleeding.  Patient is hemodynamically stable at this time with normal blood pressures and normal heart rates and has no abdominal pain.  She's received Pepcid as a H2 blocker.  She's had no further emesis here in the emergency department.  At this point in time I will not reverse the patient's Coumadin without further discussion with the admitting physician at [facility].  I also have limited access to blood products here at this location.  I have discussed the pending transfer with the patient and  her sister and they are amenable to the transfer.        Nat Christen, MD 11/01/11 0119  Pt discussed with Dr. Toniann Fail for admission to [facility], tele bed.    Nat Christen, MD 11/01/11 [id number]

## 2011-11-02 ENCOUNTER — Encounter (HOSPITAL_COMMUNITY): Payer: Self-pay

## 2011-11-02 ENCOUNTER — Encounter (HOSPITAL_COMMUNITY)
Admission: EM | Disposition: A | Discharge status: Home / Self Care | Payer: Self-pay | Source: Home / Self Care | Attending: Internal Medicine

## 2011-11-02 DIAGNOSIS — K449 Diaphragmatic hernia without obstruction or gangrene: Secondary | ICD-10-CM

## 2011-11-02 DIAGNOSIS — K2210 Ulcer of esophagus without bleeding: Secondary | ICD-10-CM | POA: Diagnosis not present

## 2011-11-02 HISTORY — PX: ESOPHAGOGASTRODUODENOSCOPY: SHX5428

## 2011-11-02 LAB — BASIC METABOLIC PANEL
BUN: 14 mg/dL (ref 6–23)
Calcium: 8.3 mg/dL — ABNORMAL LOW (ref 8.4–10.5)
GFR calc Af Amer: >90 mL/min (ref >90)
GFR calc non Af Amer: 85 mL/min — ABNORMAL LOW (ref >90)
Glucose, Bld: 84 mg/dL (ref 70–99)
Potassium: 3.7 mEq/L (ref 3.5–5.1)
Sodium: 138 mEq/L (ref 135–145)

## 2011-11-02 LAB — CBC
Hemoglobin: 9 g/dL — ABNORMAL LOW (ref 12.0–15.0)
MCH: 28.6 pg (ref 26.0–34.0)
MCHC: 32.7 g/dL (ref 30.0–36.0)
RDW: 21.1 % — ABNORMAL HIGH (ref 11.5–15.5)

## 2011-11-02 LAB — GLUCOSE, CAPILLARY
Glucose-Capillary: 91 mg/dL (ref 70–99)
Glucose-Capillary: 90 mg/dL (ref 70–99)

## 2011-11-02 LAB — PROTIME-INR: INR: 2.49 — ABNORMAL HIGH (ref 0.00–1.49)

## 2011-11-02 SURGERY — EGD (ESOPHAGOGASTRODUODENOSCOPY)
Anesthesia: Moderate Sedation

## 2011-11-02 MED ORDER — FENTANYL NICU IV SYRINGE 50 MCG/ML
INJECTION | INTRAMUSCULAR | Status: DC | PRN
  Administered 2011-11-02: 25 ug via INTRAVENOUS

## 2011-11-02 MED ORDER — PREDNISONE 5 MG PO TABS
5.0000 mg | ORAL_TABLET | Freq: Every day | ORAL | Status: DC
  Administered 2011-11-02 – 2011-11-03 (×2): 5 mg via ORAL
  Filled 2011-11-02 (×6): qty 1

## 2011-11-02 MED ORDER — HYDROCODONE-ACETAMINOPHEN 5-325 MG PO TABS
1.0000 | ORAL_TABLET | Freq: Two times a day (BID) | ORAL | Status: DC | PRN
  Administered 2011-11-02: 1 via ORAL

## 2011-11-02 MED ORDER — FENTANYL CITRATE 0.05 MG/ML IJ SOLN
INTRAMUSCULAR | Status: AC
  Filled 2011-11-02: qty 2

## 2011-11-02 MED ORDER — PANTOPRAZOLE SODIUM 40 MG PO TBEC
40.0000 mg | DELAYED_RELEASE_TABLET | Freq: Two times a day (BID) | ORAL | Status: DC
  Administered 2011-11-02 – 2011-11-03 (×2): 40 mg via ORAL
  Filled 2011-11-02 (×2): qty 1

## 2011-11-02 MED ORDER — MIDAZOLAM HCL 10 MG/2ML IJ SOLN
INTRAMUSCULAR | Status: DC | PRN
  Administered 2011-11-02: 2 mg via INTRAVENOUS

## 2011-11-02 MED ORDER — BUTAMBEN-TETRACAINE-BENZOCAINE 2-2-14 % EX AERO
INHALATION_SPRAY | CUTANEOUS | Status: DC | PRN
  Administered 2011-11-02: 1 via TOPICAL

## 2011-11-02 MED ORDER — MIDAZOLAM HCL 10 MG/2ML IJ SOLN
INTRAMUSCULAR | Status: AC
  Filled 2011-11-02: qty 2

## 2011-11-02 NOTE — Op Note (Signed)
[facility] [address] [place], [place]  [address]  ENDOSCOPY PROCEDURE REPORT  PATIENT:  Tammy Myers, Tammy Myers  MR#:  [id number] BIRTHDATE:  02-Jul-1921, 89 yrs. old  GENDER:  female  ENDOSCOPIST:  Wilhemina Bonito. Eda Keys, MD Referred by:  [facility],  PROCEDURE DATE:  11/02/2011 PROCEDURE:  EGD, diagnostic 43235 ASA CLASS:  Class II INDICATIONS:  hematemesis  MEDICATIONS:   Fentanyl 25 mcg IV, Versed 2 mg IV TOPICAL ANESTHETIC:  Cetacaine Spray  DESCRIPTION OF PROCEDURE:   After the risks benefits and alternatives of the procedure were thoroughly explained, informed consent was obtained.  The Pentax Gastroscope [id number] endoscope was introduced through the mouth and advanced to the second portion of the duodenum, without limitations.  The instrument was slowly withdrawn as the mucosa was fully examined. <<PROCEDUREIMAGES>>  Sever Ucerative Esophagitis was found in the distal 1/3 of the esophagus.  A benign stricture was found in the distal esophagus. The stomach was entered and closely examined. The antrum, angularis, and lesser curvature were well visualized, including a retroflexed view of the cardia and fundus. The stomach wall was normally distensable. The scope passed easily through the pylorus into the duodenum.  the duodenal bulb and second duodenum were without abnormalities.    Retroflexed views revealed a hiatal hernia.    The scope was then withdrawn from the patient and the procedure completed.  COMPLICATIONS:  None  ENDOSCOPIC IMPRESSION: 1) Severe Ulcerative Esophagitis in the distal esophagus 2) Stricture in the distal esophagus 3) Normal stomach 4) Normal duodenum 5) A hiatal hernia  RECOMMENDATIONS: 1) Anti-reflux regimen to be followed 2) PANTOPRAZOLE 40 MG (OR QUIVALENT PPI)  TWICE DAILY FOR 8 WEEKS THEN ONCE DAILY INDEFINTELY 3) RELOOK EGD I 8 WEEKS AT DR PERRYS FICE (THEY ILL CALL YOU TO ARRANGE)  DISCUSSED WITH FAMLY AND  REPORT GIVEN.    WILL SIGN OFF  ______________________________ Wilhemina Bonito. Eda Keys, MD  CC:  Murray Hodgkins, MD;  The Patient  n. eSIGNED:   Wilhemina Bonito. Eda Keys at 11/02/2011 12:24 PM  Arby Barrette, [phone]

## 2011-11-02 NOTE — Progress Notes (Signed)
PT Cancellation Note     Treatment cancelled today due to pt back from EGD and now sound asleep (Versed).  Will see her 11/03/11.  11/02/2011  [place] Bing, PT [phone] (pager)

## 2011-11-02 NOTE — Evaluation (Signed)
Physical Therapy Evaluation Patient Details Name: Neelah Mannings MRN: [id number] DOB: 1921-05-30 Today's Date: 11/02/2011  Problem List:  Patient Active Problem List  Diagnoses  . HYPERTENSION  . ATRIAL FIBRILLATION, PAROXYSMAL  . OSTEOARTHRITIS  . MURMUR  . Constipation, chronic  . Hemorrhoids, internal, with prolapse & bleeding  . Anemia  . GI bleed  . Other and unspecified coagulation defects  . Hematemesis  . Esophagitis, erosive  . Hiatal hernia    Past Medical History:  Past Medical History  Diagnosis Date  . PMR (polymyalgia rheumatica)   . Temporal arteritis   . Osteoporosis   . Hearing loss   . Wears glasses   . Rectal bleeding   . Arthritis   . Rectal prolapse   . Hypertension   . Mitral valve prolapse   . Spinal stenosis   . Alzheimer's dementia   . Hyperlipidemia   . GERD (gastroesophageal reflux disease)   . Sciatica   . Atrial fibrillation    Past Surgical History:  Past Surgical History  Procedure Date  . Abdominal hysterectomy   . Appendectomy   . Colonoscopy   . Colon surgery 11/16/2010    Lap sigmoid colectomy & biologic mesh rectopexy  . Breast surgery   . Hernia repair     [facility]    PT Assessment/Plan/Recommendation PT Assessment Clinical Impression Statement: 78 year-old female with history of atrial fibrillation on Coumadin, chronic anemia, polymyalgias rheumatica/temporal arteritis on steroids presented to the ER [facility] after the patient had an episode of nausea and vomiting last night after she went to the bed. Pt presents with WNL strength and supervision with ambulation and most transfers. Pt states that she feels that she is not back to her normal balance and strength. Talked with pt about having follow up therapy once she is D/C from hospital and pt agrees and states that her living facility offers therapy services. At this time PT will not be following the pt, pt is to be D/C tomorrow. Do recommend HHPT follow up  therapy to have at home.  PT Recommendation/Assessment: Patent does not need any further PT services No Skilled PT: Patient will have necessary level of assist by caregiver at discharge;Patient is supervision for all activity/mobility PT Recommendation Follow Up Recommendations: Home health PT Equipment Recommended: None recommended by PT (pt has RW at home) PT Goals  N/a  PT Evaluation Precautions/Restrictions  none Prior Functioning  Home Living Lives With: Alone (but has help from family and nurse) Receives Help From: Friend(s) Type of Home: Independent living facility Home Layout: One level Home Access: Level entry Bathroom Shower/Tub: Engineer, manufacturing systems: Standard Home Adaptive Equipment: Shower chair without back;Grab bars around toilet;Grab bars in shower;Walker - rolling;Bedside commode/3-in-1 Prior Function Level of Independence: Independent with basic ADLs;Requires assistive device for independence Driving: No Vocation: Retired Producer, television/film/video: Awake/alert Overall Cognitive Status: Appears within functional limits for tasks assessed Orientation Level: Oriented to person;Oriented to place;Oriented to situation Sensation/Coordination Sensation Light Touch: Appears Intact Coordination Gross Motor Movements are Fluid and Coordinated: Yes Fine Motor Movements are Fluid and Coordinated: Yes Extremity Assessment RUE Assessment RUE Assessment: Within Functional Limits LUE Assessment LUE Assessment: Within Functional Limits RLE Assessment RLE Assessment: Within Functional Limits LLE Assessment LLE Assessment: Within Functional Limits Mobility (including Balance) Bed Mobility Bed Mobility: Yes Supine to Sit: 5: Supervision;With rails Sitting - Scoot to Edge of Bed: 5: Supervision Sit to Supine: 7: Independent Transfers Transfers: Yes Sit to Stand: 5:  Supervision;From bed Stand to Sit: 7:  Independent Ambulation/Gait Ambulation/Gait: Yes Ambulation/Gait Assistance: 5: Supervision Ambulation Distance (Feet): 150 Feet Assistive device: Rolling walker Gait Pattern: Within Functional Limits;Trunk flexed Stairs: No Wheelchair Mobility Wheelchair Mobility: No  Posture/Postural Control Posture/Postural Control: No significant limitations Balance Balance Assessed: Yes Static Sitting Balance Static Sitting - Balance Support: Bilateral upper extremity supported;Feet supported Static Sitting - Level of Assistance: 7: Independent Dynamic Sitting Balance Dynamic Sitting - Balance Support: No upper extremity supported;During functional activity;Feet unsupported Dynamic Sitting - Level of Assistance: 5: Stand by assistance End of Session PT - End of Session Equipment Utilized During Treatment: Gait belt Activity Tolerance: Patient tolerated treatment well Patient left: in bed;with call bell in reach;with family/visitor present General Behavior During Session: [facility] for tasks performed Cognition: [facility] for tasks performed  Elvera Bicker 11/02/2011, 4:17 PM1/25/2013  [place] Bing, PT [phone] (pager) 11/03/2011

## 2011-11-02 NOTE — Progress Notes (Signed)
OT Note:  Evaluation canceled, pt going for EGD.  Will continue to follow and perform eval as appropriate. 11/02/2011 Martie Round, OTR/L Pager: [phone]

## 2011-11-02 NOTE — Progress Notes (Signed)
Subjective: Patient is status post EGD. She tolerated lunch. Since the procedure she is intermittently somnolent. She denies complaints according to her brother-in-law.  Objective: Blood pressure 122/55, pulse 86, temperature 97.5 F (36.4 C), temperature source Oral, resp. rate 18, height 5\' 6"  (1.676 m), weight 48.353 kg (106 lb 9.6 oz), SpO2 96.00%.  Intake/Output Summary (Last 24 hours) at 11/02/11 1553 Last data filed at 11/02/11 1312  Gross per 24 hour  Intake    450 ml  Output      0 ml  Net    450 ml    General Exam: Comfortable.  Respiratory System: Clear. No increased work of breathing. Cardiovascular System: First and second heart sounds heard. Regular rate and rhythm. No JVD/murmurs.  Gastrointestinal System: Abdomen is nondistended, soft and normal bowel sounds heard. Nontender. Central Nervous System: Somnolent but arousable and oriented to self. No focal neurological deficits.  Lab Results: Basic Metabolic Panel:  Basename 11/02/11 0535 11/01/11 0430  NA 138 139  K 3.7 3.6  CL 104 105  CO2 24 27  GLUCOSE 84 95  BUN 14 20  CREATININE 0.48* 0.46*  CALCIUM 8.3* 7.9*  MG -- --  PHOS -- --   Liver Function Tests:  Basename 11/01/11 0430  AST 54*  ALT 45*  ALKPHOS 547*  BILITOT 0.9  PROT 5.3*  ALBUMIN 2.3*   No results found for this basename: LIPASE:2,AMYLASE:2 in the last 72 hours No results found for this basename: AMMONIA:2 in the last 72 hours CBC:  Basename 11/02/11 0535 11/01/11 1844 11/01/11 0030  WBC 7.5 6.6 --  NEUTROABS -- -- 4.7  HGB 9.0* 8.9* --  HCT 27.5* 27.7* --  MCV 87.3 86.8 --  PLT 254 255 --   Cardiac Enzymes: No results found for this basename: CKTOTAL:3,CKMB:3,CKMBINDEX:3,TROPONINI:3 in the last 72 hours BNP: No results found for this basename: PROBNP:3 in the last 72 hours D-Dimer: No results found for this basename: DDIMER:2 in the last 72 hours CBG:  Basename 11/02/11 1334 11/02/11 0757 11/02/11 0524 11/02/11 0104  11/01/11 2054 11/01/11 1557  GLUCAP 90 91 88 106* 125* 160*   Hemoglobin A1C: No results found for this basename: HGBA1C in the last 72 hours Fasting Lipid Panel: No results found for this basename: CHOL,HDL,LDLCALC,TRIG,CHOLHDL,LDLDIRECT in the last 72 hours Thyroid Function Tests: No results found for this basename: TSH,T4TOTAL,FREET4,T3FREE,THYROIDAB in the last 72 hours Anemia Panel: No results found for this basename: VITAMINB12,FOLATE,FERRITIN,TIBC,IRON,RETICCTPCT in the last 72 hours Coagulation:  Basename 11/02/11 0535 11/01/11 0430  LABPROT 27.3* 32.0*  INR 2.49* 3.05*   Urine Drug Screen: Drugs of Abuse  No results found for this basename: labopia,  cocainscrnur,  labbenz,  amphetmu,  thcu,  labbarb    Alcohol Level: No results found for this basename: ETH:2 in the last 72 hours Micro Results: No results found for this or any previous visit (from the past 240 hour(s)).  Studies/Results: No results found.  Medications: Scheduled Meds:    . methylPREDNISolone (SOLU-MEDROL) injection  20 mg Intravenous Daily  . pantoprazole (PROTONIX) IV  40 mg Intravenous Q12H  . pneumococcal 23 valent vaccine  0.5 mL Intramuscular Tomorrow-1000   Continuous Infusions:    . sodium chloride     PRN Meds:.acetaminophen, acetaminophen, ondansetron (ZOFRAN) IV, ondansetron, DISCONTD: butamben-tetracaine-benzocaine, DISCONTD: fentaNYL, DISCONTD: midazolam  Assessment/Plan: 1. Upper GI bleed secondary to severe ulcerative esophagitis in the distal esophagus. Twice a day PPI for 8 weeks then once daily and followup with Dr. Marina Goodell outpatient for relook  EGD in 8 weeks. GI bleed clinically resolved. 2. Acute on chronic anemia: Secondary to GI bleed and epistaxis. Status post blood transfusion. Hemoglobin stable. 3. Atrial fibrillation: Currently in sinus rhythm. Coumadin on hold. Not on any rate control medications at home. Will discuss with gastroenterology regarding resuming Coumadin on  discharge. 4. Abnormal liver function tests: Unclear etiology. Asymptomatic.  5. History of polymyalgia rheumatica/temporal arteritis: Continue steroids 6. Alzheimer's dementia: Mental status probably at baseline. 7. Hypertension: Controlled  Disposition: Possible discharge tomorrow. Discussed with brother-in-law who was at the bedside.   Tammy Myers 11/02/2011, 3:53 PM

## 2011-11-02 NOTE — Op Note (Signed)
SEE PENTAX REPORT FOR DETAILS.  SEVERE ESOPHAGITIS... NEEDS BID PPI. GI F/U OUT PT. WILL SIGN OFF

## 2011-11-03 ENCOUNTER — Encounter (HOSPITAL_COMMUNITY): Payer: Self-pay | Admitting: Internal Medicine

## 2011-11-03 LAB — HEPATIC FUNCTION PANEL
ALT: 51 U/L — ABNORMAL HIGH (ref 0–35)
Albumin: 2.5 g/dL — ABNORMAL LOW (ref 3.5–5.2)
Alkaline Phosphatase: 575 U/L — ABNORMAL HIGH (ref 39–117)
Total Bilirubin: 1.3 mg/dL — ABNORMAL HIGH (ref 0.3–1.2)
Total Protein: 5.5 g/dL — ABNORMAL LOW (ref 6.0–8.3)

## 2011-11-03 LAB — CBC
HCT: 26.5 % — ABNORMAL LOW (ref 36.0–46.0)
Hemoglobin: 8.7 g/dL — ABNORMAL LOW (ref 12.0–15.0)
RBC: 3.07 MIL/uL — ABNORMAL LOW (ref 3.87–5.11)

## 2011-11-03 LAB — GLUCOSE, CAPILLARY

## 2011-11-03 MED ORDER — ASPIRIN EC 81 MG PO TBEC: 81.0000 mg | DELAYED_RELEASE_TABLET | Freq: Every day | ORAL | Status: AC

## 2011-11-03 MED ORDER — PANTOPRAZOLE SODIUM 40 MG PO TBEC: 40.0000 mg | DELAYED_RELEASE_TABLET | Freq: Two times a day (BID) | ORAL | Status: DC

## 2011-11-03 NOTE — Progress Notes (Signed)
Pt given discharge instructions with understanding Pt denies having any pain. Brother-in law at bedside, with no questions at this time. Monitor d/c. Pt left in wheelchair.

## 2011-11-03 NOTE — Discharge Summary (Addendum)
Discharge Summary  Mercades Bajaj MR#: [id number]  DOB:[birth year]  Date of Admission: 11/01/2011 Date of Discharge: 11/03/2011  Patient's PCP: [name], MD, MD  Attending Physician:Farhana Fellows  Consults: Dr. Yancey Flemings, gastroenterology  Discharge Diagnoses: 1. Upper GI bleed  2. Severe ulcerative esophagitis in the distal esophagus (by EGD) 3. Acute on chronic anemia 4. Atrial fibrillation, Coumadin temporarily held 5. Abnormal liver function tests, unclear etiology 6. History of polymyalgia rheumatica/temporal arteritis, on chronic prednisone 7. Alzheimer's dementia 8. Hypertension 9. Epistaxis history     Brief Admitting History and Physical 78 year old Caucasian female, history of atrial fibrillation on Coumadin, chronic anemia, polymyalgia rheumatica/temporal arteritis on chronic prednisone, dementia, hypertension who lives in an independent part of nursing facility. She was recently treated for epistaxis. She now presented with a single episode of vomiting clear liquids but with some bright red blood. There was no history of abdominal pain, melena or NSAID use. She was hemodynamically stable. Examination revealed pallor. Hemoglobin was 7.4 g/dL.    Discharge Medications Current Discharge Medication List    START taking these medications   Details  aspirin EC 81 MG tablet Take 1 tablet (81 mg total) by mouth daily.    pantoprazole (PROTONIX) 40 MG tablet Take 1 tablet (40 mg total) by mouth 2 (two) times daily before a meal. Qty: 60 tablet, Refills: 0      CONTINUE these medications which have NOT CHANGED   Details  Calcium Carbonate-Vitamin D (CALCIUM + D PO) Take 750 mg by mouth 2 (two) times daily.      docusate sodium (COLACE) 100 MG capsule Take 100 mg by mouth 2 (two) times daily.      donepezil (ARICEPT) 5 MG tablet Take 5 mg by mouth at bedtime as needed.      ESTRACE VAGINAL 0.1 MG/GM vaginal cream Once a week.    HYDROcodone-acetaminophen  (VICODIN) 5-500 MG per tablet Take 1 tablet by mouth every 6 (six) hours as needed. For pain    predniSONE (DELTASONE) 5 MG tablet Take 5 mg by mouth daily.        STOP taking these medications     warfarin (COUMADIN) 4 MG tablet Comments:  Reason for Stopping:          Hospital Course: 1. Upper GI bleed: Gastroenterology was consulted. They partially reversed the coagulopathy with FFP. His Coumadin was obviously held. EGD was performed which showed severe ulcerative distal esophagitis. Patient is to be on Protonix 40 mg or (equivalent PPI) twice a day for 8 weeks and then once daily indefinitely. Discussed her care with the gastroenterologist who recommends holding her Coumadin for 10-14 days and then resuming as deemed appropriate. In the interim he recommends aspirin. Also patient is to followup with Dr. Marina Goodell in 8 weeks for relook EGD. 2. Acute on chronic anemia: The acute anemia was possibly secondary to blood loss from upper GI bleed and recent epistaxis. Her baseline hemoglobin is probably 9 g/dL. Patient was transfused 2 units of packed red blood cells with appropriate response. No further evidence of clinical bleeding. 3. Atrial fibrillation: Currently in sinus rhythm. Anticoagulation currently on hold secondary to above discussion. Starting low dose aspirin in the interim. 4. Abnormal liver function tests: Stable. Unclear etiology. Outpatient evaluation as deemed necessary. 5. Dementia: Mental status at baseline.   Day of Discharge BP 135/67  Pulse 75  Temp(Src) 98.2 F (36.8 C) (Oral)  Resp 18  Ht 5\' 6"  (1.676 m)  Wt 46.947 kg (  103 lb 8 oz)  BMI 16.71 kg/m2  SpO2 97%  General Exam: Comfortable.  Respiratory System: Clear. No increased work of breathing.  Cardiovascular System: First and second heart sounds heard. Regular rate and rhythm. No JVD/murmurs. Telemetry shows sinus rhythm Gastrointestinal System: Abdomen is nondistended, soft and normal bowel sounds heard.  Nontender.  Central Nervous System: Alert and oriented to self. No focal neurological deficits.  Results for orders placed during the hospital encounter of 11/01/11 (from the past 48 hour(s))  CBC     Status: Abnormal   Collection Time   11/01/11  6:44 PM      Component Value Range Comment   WBC 6.6  4.0 - 10.5 (K/uL)    RBC 3.19 (*) 3.87 - 5.11 (MIL/uL)    Hemoglobin 8.9 (*) 12.0 - 15.0 (g/dL) POST TRANSFUSION SPECIMEN   HCT 27.7 (*) 36.0 - 46.0 (%)    MCV 86.8  78.0 - 100.0 (fL)    MCH 27.9  26.0 - 34.0 (pg)    MCHC 32.1  30.0 - 36.0 (g/dL)    RDW 96.0 (*) 45.4 - 15.5 (%)    Platelets 255  150 - 400 (K/uL)   GLUCOSE, CAPILLARY     Status: Abnormal   Collection Time   11/01/11  8:54 PM      Component Value Range Comment   Glucose-Capillary 125 (*) 70 - 99 (mg/dL)   GLUCOSE, CAPILLARY     Status: Abnormal   Collection Time   11/02/11  1:04 AM      Component Value Range Comment   Glucose-Capillary 106 (*) 70 - 99 (mg/dL)   GLUCOSE, CAPILLARY     Status: Normal   Collection Time   11/02/11  5:24 AM      Component Value Range Comment   Glucose-Capillary 88  70 - 99 (mg/dL)   CBC     Status: Abnormal   Collection Time   11/02/11  5:35 AM      Component Value Range Comment   WBC 7.5  4.0 - 10.5 (K/uL)    RBC 3.15 (*) 3.87 - 5.11 (MIL/uL)    Hemoglobin 9.0 (*) 12.0 - 15.0 (g/dL)    HCT 09.8 (*) 11.9 - 46.0 (%)    MCV 87.3  78.0 - 100.0 (fL)    MCH 28.6  26.0 - 34.0 (pg)    MCHC 32.7  30.0 - 36.0 (g/dL)    RDW 14.7 (*) 82.9 - 15.5 (%)    Platelets 254  150 - 400 (K/uL)   BASIC METABOLIC PANEL     Status: Abnormal   Collection Time   11/02/11  5:35 AM      Component Value Range Comment   Sodium 138  135 - 145 (mEq/L)    Potassium 3.7  3.5 - 5.1 (mEq/L)    Chloride 104  96 - 112 (mEq/L)    CO2 24  19 - 32 (mEq/L)    Glucose, Bld 84  70 - 99 (mg/dL)    BUN 14  6 - 23 (mg/dL)    Creatinine, Ser 5.62 (*) 0.50 - 1.10 (mg/dL)    Calcium 8.3 (*) 8.4 - 10.5 (mg/dL)    GFR calc non  Af Amer 85 (*) >90 (mL/min)    GFR calc Af Amer >90  >90 (mL/min)   PROTIME-INR     Status: Abnormal   Collection Time   11/02/11  5:35 AM      Component Value Range Comment   Prothrombin  Time 27.3 (*) 11.6 - 15.2 (seconds)    INR 2.49 (*) 0.00 - 1.49    GLUCOSE, CAPILLARY     Status: Normal   Collection Time   11/02/11  7:57 AM      Component Value Range Comment   Glucose-Capillary 91  70 - 99 (mg/dL)   GLUCOSE, CAPILLARY     Status: Normal   Collection Time   11/02/11  1:34 PM      Component Value Range Comment   Glucose-Capillary 90  70 - 99 (mg/dL)   GLUCOSE, CAPILLARY     Status: Abnormal   Collection Time   11/02/11  4:53 PM      Component Value Range Comment   Glucose-Capillary 178 (*) 70 - 99 (mg/dL)   GLUCOSE, CAPILLARY     Status: Abnormal   Collection Time   11/02/11  9:18 PM      Component Value Range Comment   Glucose-Capillary 161 (*) 70 - 99 (mg/dL)   CBC     Status: Abnormal   Collection Time   11/03/11  5:53 AM      Component Value Range Comment   WBC 7.3  4.0 - 10.5 (K/uL)    RBC 3.07 (*) 3.87 - 5.11 (MIL/uL)    Hemoglobin 8.7 (*) 12.0 - 15.0 (g/dL)    HCT 40.9 (*) 81.1 - 46.0 (%)    MCV 86.3  78.0 - 100.0 (fL)    MCH 28.3  26.0 - 34.0 (pg)    MCHC 32.8  30.0 - 36.0 (g/dL)    RDW 91.4 (*) 78.2 - 15.5 (%)    Platelets 250  150 - 400 (K/uL)   HEPATIC FUNCTION PANEL     Status: Abnormal   Collection Time   11/03/11  5:53 AM      Component Value Range Comment   Total Protein 5.5 (*) 6.0 - 8.3 (g/dL)    Albumin 2.5 (*) 3.5 - 5.2 (g/dL)    AST 64 (*) 0 - 37 (U/L)    ALT 51 (*) 0 - 35 (U/L)    Alkaline Phosphatase 575 (*) 39 - 117 (U/L)    Total Bilirubin 1.3 (*) 0.3 - 1.2 (mg/dL)    Bilirubin, Direct 0.7 (*) 0.0 - 0.3 (mg/dL)    Indirect Bilirubin 0.6  0.3 - 0.9 (mg/dL)   GLUCOSE, CAPILLARY     Status: Normal   Collection Time   11/03/11  7:40 AM      Component Value Range Comment   Glucose-Capillary 98  70 - 99 (mg/dL)    Comment 1 Notify RN       GLUCOSE, CAPILLARY     Status: Abnormal   Collection Time   11/03/11 11:48 AM      Component Value Range Comment   Glucose-Capillary 126 (*) 70 - 99 (mg/dL)    Comment 1 Notify RN      Laboratory data: 1. CBG range from 90-178 mg/d   Disposition: Discharge to home in stable condition. Updated patient's care to her brother-in-law who was here to pick her up.  Diet: Heart healthy   Activity: Increase activity gradually  Follow-up Appts: Discharge Orders    Future Appointments: Provider: Department: Dept Phone: Center:   11/28/2011 2:30 PM Ardeth Sportsman, MD [facility] [phone] None     Future Orders Please Complete By Expires   Diet - low sodium heart healthy      Increase activity slowly      Call MD for:  extreme fatigue      Call MD for:  persistant dizziness or light-headedness         TESTS THAT NEED FOLLOW-UP CBC in 5-7 days from hospital discharge.  Time spent on discharge, talking to the patient, and coordinating care: 35 mins.   SignedMarcellus Scott, MD 11/03/2011, 4:07 PM

## 2011-11-03 NOTE — Evaluation (Signed)
Occupational Therapy Evaluation Patient Details Name: Tammy Myers MRN: [id number] DOB: 1921/01/11 Today's Date: 11/03/2011  Problem List:  Patient Active Problem List  Diagnoses  . HYPERTENSION  . ATRIAL FIBRILLATION, PAROXYSMAL  . OSTEOARTHRITIS  . MURMUR  . Constipation, chronic  . Hemorrhoids, internal, with prolapse & bleeding  . Anemia  . GI bleed  . Other and unspecified coagulation defects  . Hematemesis  . Esophagitis, erosive  . Hiatal hernia    Past Medical History:  Past Medical History  Diagnosis Date  . PMR (polymyalgia rheumatica)   . Temporal arteritis   . Osteoporosis   . Hearing loss   . Wears glasses   . Rectal bleeding   . Arthritis   . Rectal prolapse   . Hypertension   . Mitral valve prolapse   . Spinal stenosis   . Alzheimer's dementia   . Hyperlipidemia   . GERD (gastroesophageal reflux disease)   . Sciatica   . Atrial fibrillation    Past Surgical History:  Past Surgical History  Procedure Date  . Abdominal hysterectomy   . Appendectomy   . Colonoscopy   . Colon surgery 11/16/2010    Lap sigmoid colectomy & biologic mesh rectopexy  . Breast surgery   . Hernia repair     LIH  . Esophagogastroduodenoscopy 11/02/2011    Procedure: ESOPHAGOGASTRODUODENOSCOPY (EGD);  Surgeon: Yancey Flemings, MD;  Location: [facility] ENDOSCOPY;  Service: Endoscopy;  Laterality: N/A;    OT Assessment/Plan/Recommendation OT Assessment Clinical Impression Statement: Pt is an 78 year old woman admitted with GIB.  Pt has returned to independent baseline in ADL and mobility.  No further OT needs or DME.  Signing off. OT Recommendation/Assessment: Patient does not need any further OT services OT Recommendation Follow Up Recommendations: No OT follow up Equipment Recommended: None recommended by OT  OT Evaluation Precautions/Restrictions  Restrictions Weight Bearing Restrictions: No Prior Functioning Home Living Lives With: Spouse Type of Home: Independent living  facility Home Layout: One level Home Access: Level entry Bathroom Shower/Tub: Tub/shower unit;Curtain Firefighter: Standard Home Adaptive Equipment: Shower chair without back;Grab bars around toilet;Grab bars in shower;Walker - rolling;Bedside commode/3-in-1 Prior Function Level of Independence: Independent with basic ADLs;Requires assistive device for independence Driving: No Vocation: Retired ADL ADL Eating/Feeding: Simulated;Independent Where Assessed - Eating/Feeding: Edge of bed Grooming: Performed;Independent Where Assessed - Grooming: Standing at sink Upper Body Bathing: Simulated;Independent Where Assessed - Upper Body Bathing: Standing at sink Lower Body Bathing: Simulated;Independent Where Assessed - Lower Body Bathing: Standing at sink Upper Body Dressing: Performed;Independent Where Assessed - Upper Body Dressing: Standing Lower Body Dressing: Performed;Independent Where Assessed - Lower Body Dressing: Sitting, bed;Sit to stand from bed Toilet Transfer: Performed;Independent Toilet Transfer Method: Proofreader: Regular height toilet;Grab bars Toileting - Clothing Manipulation: Performed;Independent Where Assessed - Toileting Clothing Manipulation: Standing Toileting - Hygiene: Performed;Independent Where Assessed - Toileting Hygiene: Sit on 3-in-1 or toilet Ambulation Related to ADLs: ambulates around room with flexed posture independently Vision/Perception  Vision - History Baseline Vision: Wears glasses all the time Patient Visual Report: No change from baseline Cognition Cognition Arousal/Alertness: Awake/alert Overall Cognitive Status: Appears within functional limits for tasks assessed Orientation Level: Oriented to person;Oriented to place;Oriented to situation;Disoriented to time Sensation/Coordination Coordination Gross Motor Movements are Fluid and Coordinated: Yes Fine Motor Movements are Fluid and Coordinated:  Yes Extremity Assessment RUE Assessment RUE Assessment: Within Functional Limits LUE Assessment LUE Assessment: Within Functional Limits Mobility  Bed Mobility Bed Mobility: Yes Supine to Sit: 7: Independent;HOB  flat Sitting - Scoot to Edge of Bed: 7: Independent Sit to Supine: 7: Independent Transfers Transfers: Yes Sit to Stand: 7: Independent;From bed;From toilet Stand to Sit: 7: Independent;To bed;To toilet End of Session OT - End of Session Equipment Utilized During Treatment: Gait belt Activity Tolerance: Patient tolerated treatment well Patient left: in bed;with family/visitor present Nurse Communication: Other (comment) (pt awaiting d/c, OT eval complete) General Behavior During Session: [facility] for tasks performed Cognition: [facility] for tasks performed   Evern Bio 11/03/2011, 11:41 AM  [phone]

## 2011-11-09 ENCOUNTER — Ambulatory Visit
Admission: RE | Admit: 2011-11-09 | Discharge: 2011-11-09 | Disposition: A | Discharge status: Home / Self Care | Payer: Medicare Other | Source: Ambulatory Visit | Attending: Internal Medicine | Admitting: Internal Medicine

## 2011-11-09 ENCOUNTER — Other Ambulatory Visit: Payer: Self-pay | Admitting: Internal Medicine

## 2011-11-09 DIAGNOSIS — R609 Edema, unspecified: Secondary | ICD-10-CM

## 2011-11-13 ENCOUNTER — Ambulatory Visit
Admission: RE | Admit: 2011-11-13 | Discharge: 2011-11-13 | Disposition: A | Discharge status: Home / Self Care | Payer: Medicare Other | Source: Ambulatory Visit | Attending: Internal Medicine | Admitting: Internal Medicine

## 2011-11-13 ENCOUNTER — Other Ambulatory Visit: Payer: Self-pay | Admitting: Nurse Practitioner

## 2011-11-13 DIAGNOSIS — R609 Edema, unspecified: Secondary | ICD-10-CM

## 2011-11-23 ENCOUNTER — Other Ambulatory Visit: Payer: Self-pay | Admitting: Internal Medicine

## 2011-11-23 DIAGNOSIS — R109 Unspecified abdominal pain: Secondary | ICD-10-CM

## 2011-11-27 ENCOUNTER — Other Ambulatory Visit: Payer: Self-pay | Admitting: Internal Medicine

## 2011-11-27 ENCOUNTER — Ambulatory Visit
Admission: RE | Admit: 2011-11-27 | Discharge: 2011-11-27 | Disposition: A | Discharge status: Home / Self Care | Payer: Medicare Other | Source: Ambulatory Visit | Attending: Internal Medicine | Admitting: Internal Medicine

## 2011-11-27 DIAGNOSIS — G8929 Other chronic pain: Secondary | ICD-10-CM

## 2011-11-27 DIAGNOSIS — R109 Unspecified abdominal pain: Secondary | ICD-10-CM

## 2011-11-27 MED ORDER — IOHEXOL 300 MG/ML  SOLN
80.0000 mL | Freq: Once | INTRAMUSCULAR | Status: AC | PRN
  Administered 2011-11-27: 80 mL via INTRAVENOUS

## 2011-11-28 ENCOUNTER — Encounter (INDEPENDENT_AMBULATORY_CARE_PROVIDER_SITE_OTHER): Payer: Medicare Other | Admitting: Surgery

## 2011-12-02 ENCOUNTER — Other Ambulatory Visit: Payer: Self-pay | Admitting: Internal Medicine

## 2011-12-08 ENCOUNTER — Emergency Department (HOSPITAL_COMMUNITY): Payer: Medicare Other

## 2011-12-08 ENCOUNTER — Inpatient Hospital Stay (HOSPITAL_COMMUNITY)
Admission: EM | Admit: 2011-12-08 | Discharge: 2011-12-15 | DRG: 418 | Disposition: A | Discharge status: Skilled Nursing Facility | Payer: Medicare Other | Attending: Family Medicine | Admitting: Family Medicine

## 2011-12-08 ENCOUNTER — Telehealth: Payer: Self-pay | Admitting: Internal Medicine

## 2011-12-08 DIAGNOSIS — I4891 Unspecified atrial fibrillation: Secondary | ICD-10-CM

## 2011-12-08 DIAGNOSIS — D696 Thrombocytopenia, unspecified: Secondary | ICD-10-CM | POA: Diagnosis not present

## 2011-12-08 DIAGNOSIS — K5900 Constipation, unspecified: Secondary | ICD-10-CM | POA: Diagnosis present

## 2011-12-08 DIAGNOSIS — K838 Other specified diseases of biliary tract: Secondary | ICD-10-CM | POA: Diagnosis present

## 2011-12-08 DIAGNOSIS — R011 Cardiac murmur, unspecified: Secondary | ICD-10-CM

## 2011-12-08 DIAGNOSIS — M353 Polymyalgia rheumatica: Secondary | ICD-10-CM | POA: Diagnosis present

## 2011-12-08 DIAGNOSIS — I059 Rheumatic mitral valve disease, unspecified: Secondary | ICD-10-CM | POA: Diagnosis present

## 2011-12-08 DIAGNOSIS — K208 Other esophagitis without bleeding: Secondary | ICD-10-CM | POA: Diagnosis present

## 2011-12-08 DIAGNOSIS — Z681 Body mass index (BMI) 19 or less, adult: Secondary | ICD-10-CM

## 2011-12-08 DIAGNOSIS — M316 Other giant cell arteritis: Secondary | ICD-10-CM | POA: Diagnosis present

## 2011-12-08 DIAGNOSIS — D509 Iron deficiency anemia, unspecified: Secondary | ICD-10-CM | POA: Diagnosis present

## 2011-12-08 DIAGNOSIS — K8010 Calculus of gallbladder with chronic cholecystitis without obstruction: Principal | ICD-10-CM

## 2011-12-08 DIAGNOSIS — R10811 Right upper quadrant abdominal tenderness: Secondary | ICD-10-CM

## 2011-12-08 DIAGNOSIS — R109 Unspecified abdominal pain: Secondary | ICD-10-CM

## 2011-12-08 DIAGNOSIS — E785 Hyperlipidemia, unspecified: Secondary | ICD-10-CM | POA: Diagnosis present

## 2011-12-08 DIAGNOSIS — R748 Abnormal levels of other serum enzymes: Secondary | ICD-10-CM

## 2011-12-08 DIAGNOSIS — Z7982 Long term (current) use of aspirin: Secondary | ICD-10-CM

## 2011-12-08 DIAGNOSIS — K648 Other hemorrhoids: Secondary | ICD-10-CM

## 2011-12-08 DIAGNOSIS — K5909 Other constipation: Secondary | ICD-10-CM

## 2011-12-08 DIAGNOSIS — I10 Essential (primary) hypertension: Secondary | ICD-10-CM

## 2011-12-08 DIAGNOSIS — G309 Alzheimer's disease, unspecified: Secondary | ICD-10-CM | POA: Diagnosis present

## 2011-12-08 DIAGNOSIS — Z79899 Other long term (current) drug therapy: Secondary | ICD-10-CM

## 2011-12-08 DIAGNOSIS — K2210 Ulcer of esophagus without bleeding: Secondary | ICD-10-CM

## 2011-12-08 DIAGNOSIS — H9190 Unspecified hearing loss, unspecified ear: Secondary | ICD-10-CM | POA: Diagnosis present

## 2011-12-08 DIAGNOSIS — R932 Abnormal findings on diagnostic imaging of liver and biliary tract: Secondary | ICD-10-CM

## 2011-12-08 DIAGNOSIS — F0280 Dementia in other diseases classified elsewhere without behavioral disturbance: Secondary | ICD-10-CM | POA: Diagnosis present

## 2011-12-08 DIAGNOSIS — D649 Anemia, unspecified: Secondary | ICD-10-CM

## 2011-12-08 DIAGNOSIS — K449 Diaphragmatic hernia without obstruction or gangrene: Secondary | ICD-10-CM | POA: Diagnosis present

## 2011-12-08 DIAGNOSIS — R188 Other ascites: Secondary | ICD-10-CM | POA: Diagnosis present

## 2011-12-08 DIAGNOSIS — K219 Gastro-esophageal reflux disease without esophagitis: Secondary | ICD-10-CM | POA: Diagnosis present

## 2011-12-08 DIAGNOSIS — IMO0002 Reserved for concepts with insufficient information to code with codable children: Secondary | ICD-10-CM

## 2011-12-08 DIAGNOSIS — E871 Hypo-osmolality and hyponatremia: Secondary | ICD-10-CM | POA: Diagnosis not present

## 2011-12-08 DIAGNOSIS — M1990 Unspecified osteoarthritis, unspecified site: Secondary | ICD-10-CM

## 2011-12-08 DIAGNOSIS — R7402 Elevation of levels of lactic acid dehydrogenase (LDH): Secondary | ICD-10-CM

## 2011-12-08 DIAGNOSIS — M810 Age-related osteoporosis without current pathological fracture: Secondary | ICD-10-CM | POA: Diagnosis present

## 2011-12-08 DIAGNOSIS — R64 Cachexia: Secondary | ICD-10-CM | POA: Diagnosis present

## 2011-12-08 DIAGNOSIS — Z7382 Dual sensory impairment: Secondary | ICD-10-CM

## 2011-12-08 DIAGNOSIS — R7401 Elevation of levels of liver transaminase levels: Secondary | ICD-10-CM | POA: Diagnosis present

## 2011-12-08 DIAGNOSIS — H547 Unspecified visual loss: Secondary | ICD-10-CM | POA: Diagnosis present

## 2011-12-08 LAB — COMPREHENSIVE METABOLIC PANEL
Albumin: 3.1 g/dL — ABNORMAL LOW (ref 3.5–5.2)
BUN: 18 mg/dL (ref 6–23)
Chloride: 102 mEq/L (ref 96–112)
Creatinine, Ser: 0.58 mg/dL (ref 0.50–1.10)
GFR calc non Af Amer: 80 mL/min — ABNORMAL LOW (ref >90)
Total Bilirubin: 1.2 mg/dL (ref 0.3–1.2)

## 2011-12-08 LAB — DIFFERENTIAL
Basophils Relative: 0 % (ref 0–1)
Monocytes Absolute: 0.8 10*3/uL (ref 0.1–1.0)
Monocytes Relative: 13 % — ABNORMAL HIGH (ref 3–12)
Neutro Abs: 3.9 10*3/uL (ref 1.7–7.7)

## 2011-12-08 LAB — CBC
HCT: 29.7 % — ABNORMAL LOW (ref 36.0–46.0)
Hemoglobin: 8.7 g/dL — ABNORMAL LOW (ref 12.0–15.0)
MCH: 25.2 pg — ABNORMAL LOW (ref 26.0–34.0)
MCHC: 29.3 g/dL — ABNORMAL LOW (ref 30.0–36.0)
MCV: 86.1 fL (ref 78.0–100.0)

## 2011-12-08 LAB — LIPASE, BLOOD: Lipase: 22 U/L (ref 11–59)

## 2011-12-08 MED ORDER — PANTOPRAZOLE SODIUM 40 MG PO TBEC: 40.0000 mg | DELAYED_RELEASE_TABLET | Freq: Two times a day (BID) | ORAL | Status: DC

## 2011-12-08 MED ORDER — FENTANYL CITRATE 0.05 MG/ML IJ SOLN
50.0000 ug | Freq: Once | INTRAMUSCULAR | Status: AC
  Administered 2011-12-08: 50 ug via INTRAVENOUS
  Filled 2011-12-08: qty 2

## 2011-12-08 MED ORDER — SODIUM CHLORIDE 0.9 % IV SOLN
INTRAVENOUS | Status: DC
  Administered 2011-12-08: 125 mL/h via INTRAVENOUS

## 2011-12-08 NOTE — Telephone Encounter (Signed)
Pt is out of her pantoprazole and needs a refill on her prescription. Called refill to [facility] at [facility] and pt is aware.

## 2011-12-08 NOTE — ED Notes (Signed)
Patient transported to X-ray 

## 2011-12-08 NOTE — ED Notes (Addendum)
Per EMS: Pt from [facility]. Pt has abdominal pain x several months and has been to see several doctors about condition and has not received a dx.  Pt takes percocet and fentanyl patch without relief.  Tender in lower abdomen. NAD noted at this time.  Has had an MRI done b/c of the abd pain.  Takes stool softeners/laxatives b/c of the pain meds and constipation.  "good BM yesterday"

## 2011-12-08 NOTE — ED Provider Notes (Signed)
History     CSN: [id number]  Arrival date & time 12/08/11  1642   First MD Initiated Contact with Patient 12/08/11 2039      Chief Complaint  Patient presents with  . Abdominal Pain    'for weeks".  MRI done a few wks ago for same.  "good BM yesterday"   Lever 5 caveat due to dementia (Consider location/radiation/quality/duration/timing/severity/associated sxs/prior treatment) Patient is a 78 y.o. female presenting with abdominal pain. The history is provided by the patient.  Abdominal Pain The primary symptoms of the illness include abdominal pain. The primary symptoms of the illness do not include fever.  Additional symptoms associated with the illness include constipation.   patient has had abdominal pain for several months. She's been seen by a couple doctors without relief or diagnosis. She was started on medicines for constipation. Does continue to have pain. She's on stool softeners and laxatives. She bowel movement yesterday without relief. Family states patient had an MRI because of the pain. Review of chart shows it was a CT that was done. There is some fluid around the liver. She's had some chronic elevation of her LFTs.  Past Medical History  Diagnosis Date  . PMR (polymyalgia rheumatica)   . Temporal arteritis   . Osteoporosis   . Hearing loss   . Wears glasses   . Rectal bleeding   . Arthritis   . Rectal prolapse   . Hypertension   . Mitral valve prolapse   . Spinal stenosis   . Alzheimer's dementia   . Hyperlipidemia   . GERD (gastroesophageal reflux disease)   . Sciatica   . Atrial fibrillation     Past Surgical History  Procedure Date  . Abdominal hysterectomy   . Appendectomy   . Colonoscopy   . Colon surgery 11/16/2010    Lap sigmoid colectomy & biologic mesh rectopexy  . Breast surgery   . Hernia repair     LIH  . Esophagogastroduodenoscopy 11/02/2011    Procedure: ESOPHAGOGASTRODUODENOSCOPY (EGD);  Surgeon: Yancey Flemings, MD;  Location: [facility] ENDOSCOPY;   Service: Endoscopy;  Laterality: N/A;    Family History  Problem Relation Age of Onset  . Cancer Brother     bladder    History  Substance Use Topics  . Smoking status: Never Smoker   . Smokeless tobacco: Not on file  . Alcohol Use: No    OB History    Grav Para Term Preterm Abortions TAB SAB Ect Mult Living                  Review of Systems  Unable to perform ROS Constitutional: Negative for fever.  Gastrointestinal: Positive for abdominal pain and constipation.    Allergies  Fosamax  Home Medications   Current Outpatient Rx  Name Route Sig Dispense Refill  . ASPIRIN EC 81 MG PO TBEC Oral Take 1 tablet (81 mg total) by mouth daily.    [name] CALCIUM + D PO Oral Take 750 mg by mouth 2 (two) times daily.      [name] DOCUSATE SODIUM 100 MG PO CAPS Oral Take 100 mg by mouth 2 (two) times daily.      . DONEPEZIL HCL 5 MG PO TABS Oral Take 5 mg by mouth at bedtime as needed. For anxiety    . ESTRACE 0.1 MG/GM VA CREA  Once a week.    . FENTANYL 25 MCG/HR TD PT72 Transdermal Place 1 patch onto the skin every 3 (three) days.    [name]  FERROUS SULFATE 325 (65 FE) MG PO TABS Oral Take 325 mg by mouth daily with breakfast.    . HYDROCODONE-ACETAMINOPHEN 5-500 MG PO TABS Oral Take 1 tablet by mouth every 6 (six) hours as needed. For pain    . ADULT MULTIVITAMIN W/MINERALS CH Oral Take 1 tablet by mouth daily.    [name] PANTOPRAZOLE SODIUM 40 MG PO TBEC Oral Take 1 tablet (40 mg total) by mouth 2 (two) times daily. 60 tablet 3  . PREDNISONE 5 MG PO TABS Oral Take 5 mg by mouth daily.        BP 159/78  Pulse 88  Temp(Src) 98.9 F (37.2 C) (Oral)  Resp 15  SpO2 93%  Physical Exam  Nursing note and vitals reviewed. Constitutional: She appears well-developed and well-nourished.  HENT:  Head: Normocephalic and atraumatic.  Eyes: EOM are normal. Pupils are equal, round, and reactive to light.  Neck: Normal range of motion. Neck supple.  Cardiovascular: Normal rate, regular rhythm and normal  heart sounds.   No murmur heard. Pulmonary/Chest: Effort normal and breath sounds normal. No respiratory distress. She has no wheezes. She has no rales.  Abdominal: Soft. Bowel sounds are normal. She exhibits no distension. There is tenderness. There is no rebound and no guarding.       Diffuse tenderness in the abdomen, possibly worsening right upper quadrant. No hernias palpated  Musculoskeletal: Normal range of motion.  Neurological: She is alert. No cranial nerve deficit.       Baseline dementia  Skin: Skin is warm and dry.  Psychiatric: She has a normal mood and affect. Her speech is normal.    ED Course  Procedures (including critical care time)  Labs Reviewed  CBC - Abnormal; Notable for the following:    RBC 3.45 (*)    Hemoglobin 8.7 (*)    HCT 29.7 (*)    MCH 25.2 (*)    MCHC 29.3 (*)    RDW 19.9 (*)    All other components within normal limits  DIFFERENTIAL - Abnormal; Notable for the following:    Monocytes Relative 13 (*)    All other components within normal limits  COMPREHENSIVE METABOLIC PANEL - Abnormal; Notable for the following:    Potassium 3.1 (*)    Glucose, Bld 101 (*)    Albumin 3.1 (*)    AST 58 (*)    ALT 37 (*)    Alkaline Phosphatase 598 (*)    GFR calc non Af Amer 80 (*)    All other components within normal limits  LIPASE, BLOOD   US Abdomen Complete  12/09/2011  *RADIOLOGY REPORT*  Clinical Data:  Abdominal pain.  ABDOMINAL ULTRASOUND COMPLETE  Comparison:  CT of the abdomen and pelvis performed 11/27/2011, 02/11/2010 and 09/17/2009, and abdominal ultrasound performed 09/17/2009  Findings:  Gallbladder:  On correlation with multiple prior CTs and the prior abdominal ultrasound, the patient has chronic gallbladder wall thickening, which is again appreciated on the current study.  The gallbladder wall measures up to 0.5 cm in thickness.  A 1.6 cm stone is noted within the gallbladder, without evidence for associated obstruction.  Evaluation for  ultrasonographic Murphy's sign is limited due to patient sedation.  Trace pericholecystic fluid is nonspecific in the presence of mild abdominal ascites; a small amount of fluid is noted at the left upper quadrant, surrounding the spleen.  Common Bile Duct:  0.9 cm in diameter; dilatation of the common hepatic duct appears stable from prior studies dating back  to 2010.  Liver:  Diffusely heterogeneous parenchymal echogenicity noted, possibly reflecting fatty infiltration; no focal lesions identified.  Limited Doppler evaluation demonstrates normal blood flow within the liver.  IVC:  Unremarkable in appearance.  Pancreas:  Dilatation of the pancreatic duct to 0.4 cm is less well characterized on prior studies, though also suggested.  The visualized portions of the pancreas are otherwise unremarkable.  Spleen:  11.7 cm in length; within normal limits in size and echotexture.  Right kidney:  9.7 cm in length; normal in size, configuration and parenchymal echogenicity.  No evidence of mass or hydronephrosis.  Left kidney:  12.9 cm in length; normal in size, configuration and parenchymal echogenicity.  No evidence of mass or hydronephrosis.  Abdominal Aorta:  Normal in caliber; no aneurysm identified.  Small bilateral pleural effusions are noted.  IMPRESSION:  1.  On comparison with prior studies dating back to 2010, there is chronic gallbladder wall thickening, pericholecystic fluid and dilatation of the common hepatic duct; dilatation of the pancreatic duct may also be stable.  Findings may reflect chronic cholecystitis or obstruction, though no distal obstructing stone has been identified; the appearance is unchanged on the current study. 2.  Cholelithiasis noted. 3.  Question of mild fatty infiltration within the liver. 4.  Trace ascites again noted in the abdomen. 5.  Small bilateral pleural effusions seen.  Original Report Authenticated By: Tonia Ghent, M.D.   Dg Abd 2 Views  12/08/2011  *RADIOLOGY REPORT*   Clinical Data: Lower abdominal pain.  ABDOMEN - 2 VIEW  Comparison: Pelvic radiograph performed 03/20/2011, and CT of the abdomen and pelvis performed 11/27/2011  Findings: The visualized bowel gas pattern is unremarkable. Scattered air and stool filled loops of colon are seen; no abnormal dilatation of small bowel loops is seen to suggest small bowel obstruction.  No free intra-abdominal air is identified on the provided upright view.  Left convex lumbar scoliosis is noted, with associated degenerative change.  Diffuse vascular calcifications are seen.  A mesh is noted overlying the pelvis.  Small bilateral pleural effusions are seen, with associated atelectasis.  Chronically increased interstitial markings are seen; peribronchial thickening and diffuse calcification of the tracheobronchial tree are seen.  The cardiomediastinal silhouette is mildly enlarged.  IMPRESSION:  1.  Unremarkable bowel gas pattern; no free intra-abdominal air seen. 2.  Small bilateral pleural effusions, with associated atelectasis. 3.  Chronic lung changes noted. 4.  Mild cardiomegaly. 5.  Left convex lumbar scoliosis. 6.  Diffuse vascular calcifications seen.  Original Report Authenticated By: Tonia Ghent, M.D.     No diagnosis found.    MDM  Acute on chronic abdominal pain. Been going on reportedly for months, but appears to been there for couple years. Recently had CT that showed some perihepatic liquid. Ultrasound done today shows some gallbladder wall thickening and pericholecystic fluid. Common ducts are also dilated. The pain isn't managed home. She'll need to be admitted. Her pain has been present over the last few years. Her LFTs appear stable        Juliet Rude. Rubin Payor, MD 12/10/11 [phone]

## 2011-12-09 ENCOUNTER — Encounter (HOSPITAL_COMMUNITY): Payer: Self-pay | Admitting: Family Medicine

## 2011-12-09 DIAGNOSIS — R109 Unspecified abdominal pain: Secondary | ICD-10-CM

## 2011-12-09 LAB — CBC
Hemoglobin: 9.1 g/dL — ABNORMAL LOW (ref 12.0–15.0)
MCH: 26.1 pg (ref 26.0–34.0)
RBC: 3.48 MIL/uL — ABNORMAL LOW (ref 3.87–5.11)

## 2011-12-09 LAB — BASIC METABOLIC PANEL
Chloride: 105 mEq/L (ref 96–112)
GFR calc non Af Amer: 82 mL/min — ABNORMAL LOW (ref >90)
Glucose, Bld: 100 mg/dL — ABNORMAL HIGH (ref 70–99)
Potassium: 2.9 mEq/L — ABNORMAL LOW (ref 3.5–5.1)
Sodium: 139 mEq/L (ref 135–145)

## 2011-12-09 LAB — HEPATIC FUNCTION PANEL
ALT: 34 U/L (ref 0–35)
AST: 54 U/L — ABNORMAL HIGH (ref 0–37)
Bilirubin, Direct: 0.7 mg/dL — ABNORMAL HIGH (ref 0.0–0.3)
Total Protein: 5.8 g/dL — ABNORMAL LOW (ref 6.0–8.3)

## 2011-12-09 LAB — MAGNESIUM: Magnesium: 1.8 mg/dL (ref 1.5–2.5)

## 2011-12-09 MED ORDER — BISACODYL 10 MG RE SUPP: 10.0000 mg | Freq: Every day | RECTAL | Status: DC | PRN

## 2011-12-09 MED ORDER — BISACODYL 10 MG RE SUPP
10.0000 mg | Freq: Once | RECTAL | Status: AC
  Administered 2011-12-09: 10 mg via RECTAL
  Filled 2011-12-09: qty 1

## 2011-12-09 MED ORDER — HYDROCODONE-ACETAMINOPHEN 5-325 MG PO TABS
1.0000 | ORAL_TABLET | ORAL | Status: DC | PRN
  Administered 2011-12-09 – 2011-12-12 (×7): 1 via ORAL
  Filled 2011-12-09 (×8): qty 1

## 2011-12-09 MED ORDER — DOCUSATE SODIUM 100 MG PO CAPS
100.0000 mg | ORAL_CAPSULE | Freq: Two times a day (BID) | ORAL | Status: DC
  Administered 2011-12-09 – 2011-12-15 (×11): 100 mg via ORAL
  Filled 2011-12-09 (×14): qty 1

## 2011-12-09 MED ORDER — DONEPEZIL HCL 5 MG PO TABS
5.0000 mg | ORAL_TABLET | Freq: Every day | ORAL | Status: DC
  Administered 2011-12-09 – 2011-12-14 (×6): 5 mg via ORAL
  Filled 2011-12-09 (×7): qty 1

## 2011-12-09 MED ORDER — HYDROCORTISONE 1 % EX CREA
TOPICAL_CREAM | Freq: Two times a day (BID) | CUTANEOUS | Status: DC
  Administered 2011-12-09 – 2011-12-14 (×10): via TOPICAL
  Filled 2011-12-09: qty 28

## 2011-12-09 MED ORDER — FERROUS SULFATE 325 (65 FE) MG PO TABS
325.0000 mg | ORAL_TABLET | Freq: Every day | ORAL | Status: DC
  Administered 2011-12-10 – 2011-12-15 (×5): 325 mg via ORAL
  Filled 2011-12-09 (×7): qty 1

## 2011-12-09 MED ORDER — PREDNISONE 5 MG PO TABS
5.0000 mg | ORAL_TABLET | Freq: Every day | ORAL | Status: DC
  Administered 2011-12-09 – 2011-12-13 (×4): 5 mg via ORAL
  Filled 2011-12-09 (×5): qty 1

## 2011-12-09 MED ORDER — POTASSIUM CHLORIDE CRYS ER 20 MEQ PO TBCR
40.0000 meq | EXTENDED_RELEASE_TABLET | ORAL | Status: AC
  Administered 2011-12-09 (×2): 40 meq via ORAL
  Filled 2011-12-09 (×2): qty 2

## 2011-12-09 MED ORDER — MORPHINE SULFATE 2 MG/ML IJ SOLN
1.0000 mg | INTRAMUSCULAR | Status: DC | PRN
  Administered 2011-12-09 – 2011-12-12 (×3): 1 mg via INTRAVENOUS
  Filled 2011-12-09 (×3): qty 1

## 2011-12-09 MED ORDER — POLYETHYLENE GLYCOL 3350 17 G PO PACK
17.0000 g | PACK | Freq: Two times a day (BID) | ORAL | Status: DC
  Administered 2011-12-09 – 2011-12-14 (×9): 17 g via ORAL
  Filled 2011-12-09 (×14): qty 1

## 2011-12-09 MED ORDER — LACTULOSE 10 GM/15ML PO SOLN
30.0000 g | Freq: Two times a day (BID) | ORAL | Status: AC
  Administered 2011-12-09: 30 g via ORAL
  Filled 2011-12-09 (×2): qty 45

## 2011-12-09 MED ORDER — PANTOPRAZOLE SODIUM 40 MG PO TBEC
40.0000 mg | DELAYED_RELEASE_TABLET | Freq: Two times a day (BID) | ORAL | Status: DC
  Administered 2011-12-09 – 2011-12-15 (×11): 40 mg via ORAL
  Filled 2011-12-09 (×15): qty 1

## 2011-12-09 MED ORDER — FENTANYL 25 MCG/HR TD PT72
25.0000 ug | MEDICATED_PATCH | TRANSDERMAL | Status: DC
  Administered 2011-12-11 – 2011-12-14 (×2): 25 ug via TRANSDERMAL
  Filled 2011-12-09 (×2): qty 1

## 2011-12-09 NOTE — Progress Notes (Signed)
PCP: [name], MD, MD  Brief HPI:  This is a 78 year old female resident of an independent living center. She came in with complaints of abdominal pain. She states her pain is generalized. She states has been ongoing for approximately 4 months however is gone worse recently. She denies any nausea vomiting or diarrhea, no fever, chills. Patient is a poor historian, she has dementia but she is clear with the fact that her abdomen is hurting.   Past medical history:  Past Medical History   Diagnosis  Date   .  PMR (polymyalgia rheumatica)    .  Temporal arteritis    .  Osteoporosis    .  Hearing loss    .  Wears glasses    .  Rectal bleeding    .  Arthritis    .  Rectal prolapse    .  Hypertension    .  Mitral valve prolapse    .  Spinal stenosis    .  Alzheimer's dementia    .  Hyperlipidemia    .  GERD (gastroesophageal reflux disease)    .  Sciatica    .  Atrial fibrillation      Consultants: None so far  Procedures: None  Subjective: Patient mentions that her abdomen hurts. Unable to give specifics. She points to the upper abdomen. Denies nausea or vomiting. Last Bm was 2 days ago. Also mentions rectal pain. Denies fever or chills.  Objective: Vital signs in last 24 hours: Temp:  [98.4 F (36.9 C)-98.9 F (37.2 C)] 98.4 F (36.9 C) (03/02 0355) Pulse Rate:  [86-88] 86  (03/02 0355) Resp:  [15-20] 20  (03/02 0355) BP: (122-159)/(78-88) 122/88 mmHg (03/02 0355) SpO2:  [93 %] 93 % (03/01 1714) Weight:  [46.72 kg (103 lb)] 46.72 kg (103 lb) (03/02 0355) Weight change:  Last BM Date: 12/07/11  Intake/Output from previous day: 03/01 0701 - 03/02 0700 In: -  Out: 1375 [Urine:1375] Intake/Output this shift: Total I/O In: -  Out: 500 [Urine:500]  General appearance: alert, cooperative, distracted and no distress Head: Normocephalic, without obvious abnormality, atraumatic Resp: clear to auscultation bilaterally Cardio: regular rate and rhythm, S1, S2  normal, no murmur, click, rub or gallop GI: Soft, Tender diffusely but mostly in LLQ. There was some pain in Upper abdomen. Murphy's sign was negative. No masses or organomegaly. Rectal examination revealed external hemorrhoid. Brown stool no blood. Extremities: extremities normal, atraumatic, no cyanosis or edema Pulses: 2+ and symmetric Skin: Skin color, texture, turgor normal. No rashes or lesions Neurologic: Grossly normal. Confused but at baseline  Lab Results:  Basename 12/09/11 0530 12/08/11 2102  WBC 6.9 6.2  HGB 9.1* 8.7*  HCT 30.0* 29.7*  PLT 201 224   BMET  Basename 12/09/11 0530 12/08/11 2102  NA 139 138  K 2.9* 3.1*  CL 105 102  CO2 28 27  GLUCOSE 100* 101*  BUN 13 18  CREATININE 0.53 0.58  CALCIUM 8.5 8.9  ALT 34 37*    Studies/Results: US Abdomen Complete  12/09/2011  *RADIOLOGY REPORT*  Clinical Data:  Abdominal pain.  ABDOMINAL ULTRASOUND COMPLETE  Comparison:  CT of the abdomen and pelvis performed 11/27/2011, 02/11/2010 and 09/17/2009, and abdominal ultrasound performed 09/17/2009  Findings:  Gallbladder:  On correlation with multiple prior CTs and the prior abdominal ultrasound, the patient has chronic gallbladder wall thickening, which is again appreciated on the current study.  The gallbladder wall measures up to 0.5 cm in thickness.  A 1.6 cm stone is noted within the gallbladder, without evidence for associated obstruction.  Evaluation for ultrasonographic Murphy's sign is limited due to patient sedation.  Trace pericholecystic fluid is nonspecific in the presence of mild abdominal ascites; a small amount of fluid is noted at the left upper quadrant, surrounding the spleen.  Common Bile Duct:  0.9 cm in diameter; dilatation of the common hepatic duct appears stable from prior studies dating back to 2010.  Liver:  Diffusely heterogeneous parenchymal echogenicity noted, possibly reflecting fatty infiltration; no focal lesions identified.  Limited Doppler evaluation  demonstrates normal blood flow within the liver.  IVC:  Unremarkable in appearance.  Pancreas:  Dilatation of the pancreatic duct to 0.4 cm is less well characterized on prior studies, though also suggested.  The visualized portions of the pancreas are otherwise unremarkable.  Spleen:  11.7 cm in length; within normal limits in size and echotexture.  Right kidney:  9.7 cm in length; normal in size, configuration and parenchymal echogenicity.  No evidence of mass or hydronephrosis.  Left kidney:  12.9 cm in length; normal in size, configuration and parenchymal echogenicity.  No evidence of mass or hydronephrosis.  Abdominal Aorta:  Normal in caliber; no aneurysm identified.  Small bilateral pleural effusions are noted.  IMPRESSION:  1.  On comparison with prior studies dating back to 2010, there is chronic gallbladder wall thickening, pericholecystic fluid and dilatation of the common hepatic duct; dilatation of the pancreatic duct may also be stable.  Findings may reflect chronic cholecystitis or obstruction, though no distal obstructing stone has been identified; the appearance is unchanged on the current study. 2.  Cholelithiasis noted. 3.  Question of mild fatty infiltration within the liver. 4.  Trace ascites again noted in the abdomen. 5.  Small bilateral pleural effusions seen.  Original Report Authenticated By: Tonia Ghent, M.D.   Dg Abd 2 Views  12/08/2011  *RADIOLOGY REPORT*  Clinical Data: Lower abdominal pain.  ABDOMEN - 2 VIEW  Comparison: Pelvic radiograph performed 03/20/2011, and CT of the abdomen and pelvis performed 11/27/2011  Findings: The visualized bowel gas pattern is unremarkable. Scattered air and stool filled loops of colon are seen; no abnormal dilatation of small bowel loops is seen to suggest small bowel obstruction.  No free intra-abdominal air is identified on the provided upright view.  Left convex lumbar scoliosis is noted, with associated degenerative change.  Diffuse vascular  calcifications are seen.  A mesh is noted overlying the pelvis.  Small bilateral pleural effusions are seen, with associated atelectasis.  Chronically increased interstitial markings are seen; peribronchial thickening and diffuse calcification of the tracheobronchial tree are seen.  The cardiomediastinal silhouette is mildly enlarged.  IMPRESSION:  1.  Unremarkable bowel gas pattern; no free intra-abdominal air seen. 2.  Small bilateral pleural effusions, with associated atelectasis. 3.  Chronic lung changes noted. 4.  Mild cardiomegaly. 5.  Left convex lumbar scoliosis. 6.  Diffuse vascular calcifications seen.  Original Report Authenticated By: Tonia Ghent, M.D.    Medications:  Scheduled:    . docusate sodium  100 mg Oral BID  . donepezil  5 mg Oral QHS  . fentaNYL  25 mcg Transdermal Q72H  . fentaNYL  50 mcg Intravenous Once  . ferrous sulfate  325 mg Oral QPC breakfast  . lactulose  30 g Oral BID  . pantoprazole  40 mg Oral BID AC  . potassium chloride  40 mEq Oral Q4H  . predniSONE  5 mg Oral Q breakfast  Assessment/Plan:  Active Problems:  HYPERTENSION  ATRIAL FIBRILLATION, PAROXYSMAL  MURMUR  Constipation, chronic  Hemorrhoids, internal, with prolapse & bleeding  Anemia  Esophagitis, erosive  Abdominal pain    1. Abdominal pain: This is probably secondary to constipation. Gallbladder abnormality noted but its most likely chronic. But she does have elevated Alk Phos and mildly elevated Bili. Will get HIDA on Monday. If LFT's worsen may consider GI input. RUQ was not particularly tender. Difficult examination due to dementia.  2. Chronic constipation: Likely reason for her presentation. Will put her on a bowel regimen.  3. Recent diagnosis of severe ulcerative esophagitis: Continue PPI BID.  4. Atrial fibrillation: Taken off of coumadin in January with plans to reinstitute in 2 weeks. Hasn't been done so far. Unclear why. Will let PCP address this. Should be on aspirin  in the interim.  5. Anemia: Stable. Monitor. Check anemia panel. Could have chronic GI bleeding.  6. HYPERTENSION: Stable  7. Polymyalgia rheumatica and Temporal arteritis: Patient on chronic steroids which will be continued.   8. Chronic elevated LFTs: Unclear etiology, this is been present since 2010. Ultrasound questions chronic cholecystitis or obstruction, will order HIDA scan to evaluate. Check GGT. See discussion above.  9. Full Code  10. DVT prophylaxis: SCD's      LOS: 1 day   [facility] Pager [phone] 12/09/2011, 11:25 AM

## 2011-12-09 NOTE — H&P (Addendum)
PCP:   [name], MD, MD   Chief Complaint:  Abdominal pain  HPI: This is a 78 year old female resident of an independent living center. She came in with complaints of abdominal pain. She states her pain is generalized. She states has been ongoing for approximately 4 months however is gone worse recently. She denies any nausea vomiting or diarrhea, no fever, chills. Patient is a poor historian, she has dementia but she is clear with the fact that her abdomen is hurting.  Review of Systems: Positives bolded    anorexia, fever, weight loss,, vision loss, decreased hearing, hoarseness, chest pain, syncope, dyspnea on exertion, peripheral edema, balance deficits, hemoptysis, abdominal pain, melena, hematochezia, severe indigestion/heartburn, hematuria, incontinence, genital sores, muscle weakness, suspicious skin lesions, transient blindness, difficulty walking, depression, unusual weight change, abnormal bleeding, enlarged lymph nodes, angioedema, and breast masses.  Past Medical History: Past Medical History  Diagnosis Date  . PMR (polymyalgia rheumatica)   . Temporal arteritis   . Osteoporosis   . Hearing loss   . Wears glasses   . Rectal bleeding   . Arthritis   . Rectal prolapse   . Hypertension   . Mitral valve prolapse   . Spinal stenosis   . Alzheimer's dementia   . Hyperlipidemia   . GERD (gastroesophageal reflux disease)   . Sciatica   . Atrial fibrillation    Past Surgical History  Procedure Date  . Abdominal hysterectomy   . Appendectomy   . Colonoscopy   . Colon surgery 11/16/2010    Lap sigmoid colectomy & biologic mesh rectopexy  . Breast surgery   . Hernia repair     [facility]  . Esophagogastroduodenoscopy 11/02/2011    Procedure: ESOPHAGOGASTRODUODENOSCOPY (EGD);  Surgeon: Yancey Flemings, MD;  Location: [facility] ENDOSCOPY;  Service: Endoscopy;  Laterality: N/A;    Medications: Prior to Admission medications   Medication Sig Start Date End Date Taking? Authorizing  Provider  aspirin EC 81 MG tablet Take 1 tablet (81 mg total) by mouth daily. 11/03/11 11/02/12 Yes Marcellus Scott, MD  Calcium Carbonate-Vitamin D (CALCIUM + D PO) Take 750 mg by mouth 2 (two) times daily.     Yes Historical Provider, MD  docusate sodium (COLACE) 100 MG capsule Take 100 mg by mouth 2 (two) times daily.     Yes Historical Provider, MD  donepezil (ARICEPT) 5 MG tablet Take 5 mg by mouth at bedtime as needed. For anxiety   Yes Historical Provider, MD  ESTRACE VAGINAL 0.1 MG/GM vaginal cream Once a week. 08/16/11  Yes Historical Provider, MD  fentaNYL (DURAGESIC - DOSED MCG/HR) 25 MCG/HR Place 1 patch onto the skin every 3 (three) days.   Yes Historical Provider, MD  ferrous sulfate 325 (65 FE) MG tablet Take 325 mg by mouth daily with breakfast.   Yes Historical Provider, MD  HYDROcodone-acetaminophen (VICODIN) 5-500 MG per tablet Take 1 tablet by mouth every 6 (six) hours as needed. For pain   Yes Historical Provider, MD  Multiple Vitamin (MULITIVITAMIN WITH MINERALS) TABS Take 1 tablet by mouth daily.   Yes Historical Provider, MD  pantoprazole (PROTONIX) 40 MG tablet Take 1 tablet (40 mg total) by mouth 2 (two) times daily. 12/08/11 12/07/12 Yes Yancey Flemings, MD  predniSONE (DELTASONE) 5 MG tablet Take 5 mg by mouth daily.     Yes Historical Provider, MD    Allergies:   Allergies  Allergen Reactions  . Fosamax     Social History:  reports that she has never  smoked. She does not have any smokeless tobacco history on file. She reports that she does not drink alcohol or use illicit drugs.  Family History: Family History  Problem Relation Age of Onset  . Cancer Brother     bladder    Physical Exam: Filed Vitals:   12/08/11 1714  BP: 159/78  Pulse: 88  Temp: 98.9 F (37.2 C)  TempSrc: Oral  Resp: 15  SpO2: 93%    General:  Alert and oriented times three, well developed and nourished, no acute distress Eyes: PERRLA, pink conjunctiva, no scleral icterus ENT: Moist oral  mucosa, neck supple, no thyromegaly Lungs: clear to ascultation, no wheeze, no crackles, no use of accessory muscles Cardiovascular: regular rate and rhythm, no regurgitation, no gallops, no murmurs. No carotid bruits, no JVD Abdomen: soft, positive BS, generalized tenderness to palpation, non-distended, no organomegaly, not an acute abdomen GU: not examined Neuro: CN II - XII grossly intact, sensation intact Musculoskeletal: strength 5/5 all extremities, no clubbing, cyanosis or edema Skin: no rash, no subcutaneous crepitation, no decubitus Psych: appropriate patient   Labs on Admission:   [facility] 12/08/11 2102  NA 138  K 3.1*  CL 102  CO2 27  GLUCOSE 101*  BUN 18  CREATININE 0.58  CALCIUM 8.9  MG --  PHOS --    Basename 12/08/11 2102  AST 58*  ALT 37*  ALKPHOS 598*  BILITOT 1.2  PROT 6.2  ALBUMIN 3.1*    Basename 12/08/11 2102  LIPASE 22  AMYLASE --    Basename 12/08/11 2102  WBC 6.2  NEUTROABS 3.9  HGB 8.7*  HCT 29.7*  MCV 86.1  PLT 224   No results found for this basename: CKTOTAL:3,CKMB:3,CKMBINDEX:3,TROPONINI:3 in the last 72 hours No components found with this basename: POCBNP:3 No results found for this basename: DDIMER:2 in the last 72 hours No results found for this basename: HGBA1C:2 in the last 72 hours No results found for this basename: CHOL:2,HDL:2,LDLCALC:2,TRIG:2,CHOLHDL:2,LDLDIRECT:2 in the last 72 hours No results found for this basename: TSH,T4TOTAL,FREET3,T3FREE,THYROIDAB in the last 72 hours No results found for this basename: VITAMINB12:2,FOLATE:2,FERRITIN:2,TIBC:2,IRON:2,RETICCTPCT:2 in the last 72 hours  Micro Results: No results found for this or any previous visit (from the past 240 hour(s)).   Radiological Exams on Admission: US Abdomen Complete  12/09/2011  *RADIOLOGY REPORT*  Clinical Data:  Abdominal pain.  ABDOMINAL ULTRASOUND COMPLETE  Comparison:  CT of the abdomen and pelvis performed 11/27/2011, 02/11/2010 and  09/17/2009, and abdominal ultrasound performed 09/17/2009  Findings:  Gallbladder:  On correlation with multiple prior CTs and the prior abdominal ultrasound, the patient has chronic gallbladder wall thickening, which is again appreciated on the current study.  The gallbladder wall measures up to 0.5 cm in thickness.  A 1.6 cm stone is noted within the gallbladder, without evidence for associated obstruction.  Evaluation for ultrasonographic Murphy's sign is limited due to patient sedation.  Trace pericholecystic fluid is nonspecific in the presence of mild abdominal ascites; a small amount of fluid is noted at the left upper quadrant, surrounding the spleen.  Common Bile Duct:  0.9 cm in diameter; dilatation of the common hepatic duct appears stable from prior studies dating back to 2010.  Liver:  Diffusely heterogeneous parenchymal echogenicity noted, possibly reflecting fatty infiltration; no focal lesions identified.  Limited Doppler evaluation demonstrates normal blood flow within the liver.  IVC:  Unremarkable in appearance.  Pancreas:  Dilatation of the pancreatic duct to 0.4 cm is less well characterized on prior studies, though  also suggested.  The visualized portions of the pancreas are otherwise unremarkable.  Spleen:  11.7 cm in length; within normal limits in size and echotexture.  Right kidney:  9.7 cm in length; normal in size, configuration and parenchymal echogenicity.  No evidence of mass or hydronephrosis.  Left kidney:  12.9 cm in length; normal in size, configuration and parenchymal echogenicity.  No evidence of mass or hydronephrosis.  Abdominal Aorta:  Normal in caliber; no aneurysm identified.  Small bilateral pleural effusions are noted.  IMPRESSION:  1.  On comparison with prior studies dating back to 2010, there is chronic gallbladder wall thickening, pericholecystic fluid and dilatation of the common hepatic duct; dilatation of the pancreatic duct may also be stable.  Findings may reflect  chronic cholecystitis or obstruction, though no distal obstructing stone has been identified; the appearance is unchanged on the current study. 2.  Cholelithiasis noted. 3.  Question of mild fatty infiltration within the liver. 4.  Trace ascites again noted in the abdomen. 5.  Small bilateral pleural effusions seen.  Original Report Authenticated By: Tonia Ghent, M.D.   Dg Abd 2 Views  12/08/2011  *RADIOLOGY REPORT*  Clinical Data: Lower abdominal pain.  ABDOMEN - 2 VIEW  Comparison: Pelvic radiograph performed 03/20/2011, and CT of the abdomen and pelvis performed 11/27/2011  Findings: The visualized bowel gas pattern is unremarkable. Scattered air and stool filled loops of colon are seen; no abnormal dilatation of small bowel loops is seen to suggest small bowel obstruction.  No free intra-abdominal air is identified on the provided upright view.  Left convex lumbar scoliosis is noted, with associated degenerative change.  Diffuse vascular calcifications are seen.  A mesh is noted overlying the pelvis.  Small bilateral pleural effusions are seen, with associated atelectasis.  Chronically increased interstitial markings are seen; peribronchial thickening and diffuse calcification of the tracheobronchial tree are seen.  The cardiomediastinal silhouette is mildly enlarged.  IMPRESSION:  1.  Unremarkable bowel gas pattern; no free intra-abdominal air seen. 2.  Small bilateral pleural effusions, with associated atelectasis. 3.  Chronic lung changes noted. 4.  Mild cardiomegaly. 5.  Left convex lumbar scoliosis. 6.  Diffuse vascular calcifications seen.  Original Report Authenticated By: Tonia Ghent, M.D.    Assessment/Plan Present on Admission:  Abdominal pain Chronic constipation Admit to observation status to MedSurg Unclear etiology Patient does have history of chronic constipation, question could this be a cause Will order a bowel regimen  Pain medications ordered, patient without an acute abdomen     recent diagnosis of severe ulcerative esophagitis Atrial fibrillation Patient on Protonix twice a day, she is also on a baby aspirin daily.I will hold patient's aspirin  Continue Protonix  .Anemia .HYPERTENSION Stable resume home medication Polymyalgia rheumatica Temporal arteritis Patient on chronic steroids, this can also be worsening patient's the ulceration. She needs her steroids but as stated aspirin will be held Chronic elevated LFTs Unclear etiology, this is been present since 2010 Ultrasound questions chronic cholecystitis or obstruction, will order HIDA scan to evaluate. Bilirubin level is normal.    shunt functionFull code SCDs for DVT prophylaxis Team 2/Dr. Truddie Coco, Chianne Byrns 12/09/2011, 3:26 AM

## 2011-12-10 DIAGNOSIS — R748 Abnormal levels of other serum enzymes: Secondary | ICD-10-CM

## 2011-12-10 DIAGNOSIS — R10811 Right upper quadrant abdominal tenderness: Secondary | ICD-10-CM

## 2011-12-10 DIAGNOSIS — R109 Unspecified abdominal pain: Secondary | ICD-10-CM

## 2011-12-10 DIAGNOSIS — R188 Other ascites: Secondary | ICD-10-CM | POA: Diagnosis present

## 2011-12-10 DIAGNOSIS — R932 Abnormal findings on diagnostic imaging of liver and biliary tract: Secondary | ICD-10-CM

## 2011-12-10 LAB — COMPREHENSIVE METABOLIC PANEL
ALT: 38 U/L — ABNORMAL HIGH (ref 0–35)
BUN: 10 mg/dL (ref 6–23)
CO2: 25 mEq/L (ref 19–32)
Calcium: 8.7 mg/dL (ref 8.4–10.5)
GFR calc Af Amer: >90 mL/min (ref >90)
GFR calc non Af Amer: 81 mL/min — ABNORMAL LOW (ref >90)
Glucose, Bld: 89 mg/dL (ref 70–99)
Sodium: 136 mEq/L (ref 135–145)
Total Protein: 5.8 g/dL — ABNORMAL LOW (ref 6.0–8.3)

## 2011-12-10 LAB — CBC
HCT: 30.8 % — ABNORMAL LOW (ref 36.0–46.0)
Hemoglobin: 9.3 g/dL — ABNORMAL LOW (ref 12.0–15.0)
MCH: 26.1 pg (ref 26.0–34.0)
MCHC: 30.2 g/dL (ref 30.0–36.0)
MCV: 86.5 fL (ref 78.0–100.0)
RBC: 3.56 MIL/uL — ABNORMAL LOW (ref 3.87–5.11)

## 2011-12-10 LAB — LIPASE, BLOOD: Lipase: 26 U/L (ref 11–59)

## 2011-12-10 LAB — PROTIME-INR: INR: 1.14 (ref 0.00–1.49)

## 2011-12-10 NOTE — Consult Note (Signed)
[facility] Consultation      Tammy Boop, MD, [facility]   Requesting Provider: Dr. Barnie Del, Hospitalists Primary Care Physician:  [name], MD, MD Primary Gastroenterologist:  Dr. Yancey Flemings  Reason for Consultation:  Abdominal pain, abnormal liver tests abnormal gallbladder and bile duct on imaging  Assessment:   Right upper quadrant tenderness, with abdominal pain. There is cholelithiasis, and a chronically thickened gallbladder and chronically dilated bile duct. There is trace pericholecystic fluid and abdominal ascites, mild amount. The liver texture is diffusely heterogeneous on ultrasound, She's had chronically abnormal transaminases and alkaline phosphatase. These have progressively worsened over the years. They have been elevated since 2010 at least. She also has long-standing polymyalgia rheumatica which could potentially contribute to the biochemical abnormalities.  Etiology of this is not entirely clear. I do think that she could have acute on chronic cholecystitis. Choledocholithiasis is possible but not yet proven. A chronic liver disease or bile duct disease is possible though less likely. She could have something like primary biliary cirrhosis but I don't think she should be tender in the right upper quadrant from that.  Recommendations:   I think a HIDA scan is reasonable in this situation, difficult to sort out the exact cause of her symptoms and to clearly correlate with bile chemical and radiologic abnormalities.   She could need a diagnostic paracentesis though her pain seems more focal which goes against something like SBP.  Will check an antimitochondrial antibody and ANA. Check ProTime INR as well.  We will followup tomorrow.  HPI: Tammy Myers is a 77 y.o. female with multiple medical problems, she was admitted with what sounds like new abdominal pain. She is pleasantly demented, and mostly oriented but the history is difficult due to  memory disturbance. She says she's had abdominal pain for weeks if not months. She cannot really tell me exactly when it started. Something did seem to change or worsen that she was brought to the hospital and admitted. She does not have any nausea vomiting. She says she is moving her bowels reasonably well though there is a history of chronic constipation. She points to a band across her upper abdomen when asked about where the pain is. She cannot really characterize as she said it's just the pain. There is no reported fever, rectal bleeding, or other changes. She says she is swallowing without difficulty. In January she underwent upper endoscopy by Dr. Marina Goodell and was found to have severe ulcerative esophagitis with stricture affect and hiatal hernia. Warfarin was discontinued after that.   Past Medical History  Diagnosis Date  . PMR (polymyalgia rheumatica)   . Temporal arteritis   . Osteoporosis   . Hearing loss   . Wears glasses   . Rectal bleeding   . Arthritis   . Rectal prolapse   . Hypertension   . Mitral valve prolapse   . Spinal stenosis   . Alzheimer's dementia   . Hyperlipidemia   . GERD (gastroesophageal reflux disease)   . Sciatica   . Atrial fibrillation     Past Surgical History  Procedure Date  . Abdominal hysterectomy   . Appendectomy   . Colonoscopy   . Colon surgery 11/16/2010    Lap sigmoid colectomy & biologic mesh rectopexy  . Breast surgery   . Hernia repair     LI[facility]  . Esophagogastroduodenoscopy 11/02/2011    Procedure: ESOPHAGOGASTRODUODENOSCOPY (EGD);  Surgeon: Yancey Flemings, MD;  Location: [facility] ENDOSCOPY;  Service: Endoscopy;  Laterality:  N/A;   hemorrhoidal ligation  Prior to Admission medications   Medication Sig Start Date End Date Taking? Authorizing Provider  aspirin EC 81 MG tablet Take 1 tablet (81 mg total) by mouth daily. 11/03/11 11/02/12 Yes Marcellus Scott, MD  Calcium Carbonate-Vitamin D (CALCIUM + D PO) Take 750 mg by mouth 2 (two) times daily.      Yes Historical Provider, MD  docusate sodium (COLACE) 100 MG capsule Take 100 mg by mouth 2 (two) times daily.     Yes Historical Provider, MD  donepezil (ARICEPT) 5 MG tablet Take 5 mg by mouth at bedtime as needed. For anxiety   Yes Historical Provider, MD  ESTRACE VAGINAL 0.1 MG/GM vaginal cream Once a week. 08/16/11  Yes Historical Provider, MD  fentaNYL (DURAGESIC - DOSED MCG/HR) 25 MCG/HR Place 1 patch onto the skin every 3 (three) days.   Yes Historical Provider, MD  ferrous sulfate 325 (65 FE) MG tablet Take 325 mg by mouth daily with breakfast.   Yes Historical Provider, MD  HYDROcodone-acetaminophen (VICODIN) 5-500 MG per tablet Take 1 tablet by mouth every 6 (six) hours as needed. For pain   Yes Historical Provider, MD  Multiple Vitamin (MULITIVITAMIN WITH MINERALS) TABS Take 1 tablet by mouth daily.   Yes Historical Provider, MD  pantoprazole (PROTONIX) 40 MG tablet Take 1 tablet (40 mg total) by mouth 2 (two) times daily. 12/08/11 12/07/12 Yes Yancey Flemings, MD  predniSONE (DELTASONE) 5 MG tablet Take 5 mg by mouth daily.     Yes Historical Provider, MD    Current Facility-Administered Medications  Medication Dose Route Frequency Provider Last Rate Last Dose  . bisacodyl (DULCOLAX) suppository 10 mg  10 mg Rectal Daily PRN Osvaldo Shipper, MD      . docusate sodium (COLACE) capsule 100 mg  100 mg Oral BID Debby Crosley, MD   100 mg at 12/10/11 1042  . donepezil (ARICEPT) tablet 5 mg  5 mg Oral QHS Debby Crosley, MD   5 mg at 12/09/11 2117  . fentaNYL (DURAGESIC - dosed mcg/hr) patch 25 mcg  25 mcg Transdermal Q72H Debby Crosley, MD      . ferrous sulfate tablet 325 mg  325 mg Oral QPC breakfast Debby Crosley, MD   325 mg at 12/10/11 1042  . HYDROcodone-acetaminophen (NORCO) 5-325 MG per tablet 1 tablet  1 tablet Oral Q4H PRN Gery Pray, MD   1 tablet at 12/10/11 1218  . hydrocortisone cream 1 %   Topical BID Osvaldo Shipper, MD      . lactulose (CHRONULAC) 10 GM/15ML solution 30 g  30 g  Oral BID Debby Crosley, MD   30 g at 12/09/11 1049  . morphine 2 MG/ML injection 1 mg  1 mg Intravenous Q4H PRN Gery Pray, MD   1 mg at 12/09/11 0355  . pantoprazole (PROTONIX) EC tablet 40 mg  40 mg Oral BID AC Debby Crosley, MD   40 mg at 12/10/11 1042  . polyethylene glycol (MIRALAX / GLYCOLAX) packet 17 g  17 g Oral BID Osvaldo Shipper, MD   17 g at 12/10/11 1042  . potassium chloride SA (K-DUR,KLOR-CON) CR tablet 40 mEq  40 mEq Oral Q4H Osvaldo Shipper, MD   40 mEq at 12/09/11 1555  . predniSONE (DELTASONE) tablet 5 mg  5 mg Oral Q breakfast Debby Crosley, MD   5 mg at 12/10/11 1042    Allergies as of 12/08/2011 - Review Complete 12/08/2011  Allergen Reaction Noted  . Fosamax  11/01/2011    Family History  Problem Relation Age of Onset  . Cancer Brother     bladder    History   Social History  . Marital Status: Widowed    Spouse Name: N/A    Number of Children: N/A  . Years of Education: N/A   Occupational History  . Not on file.   Social History Main Topics  . Smoking status: Never Smoker   . Smokeless tobacco: Not on file  . Alcohol Use: No  . Drug Use: No          Review of Systems: Mentioned limits ability to take a full review of systems. As best I can tell, a other review of systems negative except as mentioned in the HPI.  Physical Exam: Vital signs in last 24 hours: Temp:  [97.6 F (36.4 C)-98.5 F (36.9 C)] 97.6 F (36.4 C) (03/03 0534) Pulse Rate:  [79-88] 79  (03/03 0534) Resp:  [16-18] 18  (03/03 0534) BP: (148-152)/(78-79) 148/78 mmHg (03/03 0534) SpO2:  [90 %-93 %] 93 % (03/03 0534) Last BM Date: 12/09/11 General:   Alert, thin elderly woman, pleasant and cooperative in NAD Eyes:  Sclera clear, no icterus.   Conjunctiva pink. Mouth:  No deformity or lesions.  Oropharynx pink & moist. Neck:  Supple; no masses or thyromegaly. Lungs:  Clear throughout to auscultation.   Heart:  Regular rate and rhythm; occasional premature beat no murmurs,  clicks, rubs,  or gallops. Abdomen:  Soft, and nondistended. No masses, hepatosplenomegaly or hernias noted. Normal bowel sounds. There is tenderness in the right upper quadrant to moderately deep palpation. She initially guarded. Less tender on repeat exam but persistently so. Well-healed surgical scars are present.  Extremities:  Without clubbing or edema. Neurologic:  Alert and  oriented x 2, he could not tell me the year but she knew she was at [facility] she knew who pimary care physician was, she cooperated with exam Skin:  Intact without significant lesions or rashes. Lymph Nodes:  No significant cervical or supraclavicular adenopathy. Psych:  Alert and cooperative. Normal mood and affect.  Intake/Output from previous day: 03/02 0701 - 03/03 0700 In: 600 [P.O.:600] Out: 1200 [Urine:1200]   Lab Results:  Basename 12/10/11 0350 12/09/11 0530 12/08/11 2102  WBC 5.9 6.9 6.2  HGB 9.3* 9.1* 8.7*  HCT 30.8* 30.0* 29.7*  PLT 178 201 224   BMET  Basename 12/10/11 0350 12/09/11 0530 12/08/11 2102  NA 136 139 138  K 4.1 2.9* 3.1*  CL 105 105 102  CO2 25 28 27   GLUCOSE 89 100* 101*  BUN 10 13 18   CREATININE 0.56 0.53 0.58  CALCIUM 8.7 8.5 8.9   LFT  Results for Tammy Myers, Tammy Myers (MRN [id number]) as of 12/10/2011 14:44  Ref. Range 11/03/2011 05:53 12/08/2011 21:02 12/09/2011 05:30 12/09/2011 12:29 12/10/2011 03:50  Magnesium Latest Range: 1.5-2.5 mg/dL   1.8    Alkaline Phosphatase Latest Range: 39-117 U/L 575 (H) 598 (H) 553 (H)  588 (H)  Albumin Latest Range: 3.5-5.2 g/dL 2.5 (L) 3.1 (L) 2.8 (L)  2.8 (L)  Lipase Latest Range: 11-59 U/L  22   26  AST Latest Range: 0-37 U/L 64 (H) 58 (H) 54 (H)  62 (H)  ALT Latest Range: 0-35 U/L 51 (H) 37 (H) 34  38 (H)  Total Protein Latest Range: 6.0-8.3 g/dL 5.5 (L) 6.2 5.8 (L)  5.8 (L)  Bilirubin, Direct Latest Range: 0.0-0.3 mg/dL 0.7 (H)  0.7 (H)  Indirect Bilirubin Latest Range: 0.3-0.9 mg/dL 0.6  0.7    Total Bilirubin Latest Range: 0.3-1.2  mg/dL 1.3 (H) 1.2 1.4 (H)  1.7 (H)     09/18/2011 Total Protein 6.3 6.0 - 8.3 g/dL  Albumin 3.6 3.5 - 5.2 g/dL  AST 47 (H) 0 - 37 U/L   ALT 40 (H) 0 - 35 U/L  Alkaline Phosphatase 212 (H) 39 - 117 U/L  Total Bilirubin 0.4 0.3 - 1.2 mg/dL         Studies/Results:   CT ultrasound images personally reviewed  CT abdomen and pelvis with contrast 11/27/2011  IMPRESSION:  Small bilateral pleural effusions with compressive atelectasis in  the lower lobes.  Small amount of perihepatic and perisplenic ascites. Fluid is also  noted around the gallbladder which may be related to the ascites  rather than gallbladder disease. Recommend clinical correlation  for any symptoms referable to the gallbladder and if further  evaluation is felt warranted, ultrasound may be of benefit.  Large stool burden throughout the colon suggesting constipation.  Slight dilatation of the aortic root, 4.0 cm.  Cardiomegaly, coronary artery disease.     US Abdomen Complete 12/08/2011   IMPRESSION:  1.  On comparison with prior studies dating back to 2010, there is chronic gallbladder wall thickening, pericholecystic fluid and dilatation of the common hepatic duct; dilatation of the pancreatic duct may also be stable.  Findings may reflect chronic cholecystitis or obstruction, though no distal obstructing stone has been identified; the appearance is unchanged on the current study. 2.  Cholelithiasis noted. 3.  Question of mild fatty infiltration within the liver. 4.  Trace ascites again noted in the abdomen. 5.  Small bilateral pleural effusions seen.  Original Report Authenticated By: Tonia Ghent, M.D.   Dg Abd 2 Views  12/08/2011   IMPRESSION:  1.  Unremarkable bowel gas pattern; no free intra-abdominal air seen. 2.  Small bilateral pleural effusions, with associated atelectasis. 3.  Chronic lung changes noted. 4.  Mild cardiomegaly. 5.  Left convex lumbar scoliosis. 6.  Diffuse vascular calcifications  seen.  Original Report Authenticated By: Tonia Ghent, M.D.     Previous Endoscopies: EGD 11/02/2011, Dr. Marina Goodell. Severe ulcerative esophagitis with stricture, hiatal hernia     LOS: 2 days      @[place] [place]  Sena Slate, MD, [facility] [phone] (pager) 12/10/2011 2:17 PM@

## 2011-12-10 NOTE — Progress Notes (Signed)
PCP: [name], MD, MD  Brief HPI:  This is a 78 year old female resident of an independent living center. She came in with complaints of abdominal pain. She states her pain is generalized. She states has been ongoing for approximately 4 months however is gone worse recently. She denies any nausea vomiting or diarrhea, no fever, chills. Patient is a poor historian, she has dementia but she was clear with the fact that her abdomen is hurting.   Past medical history:  Past Medical History   Diagnosis  Date   .  PMR (polymyalgia rheumatica)    .  Temporal arteritis    .  Osteoporosis    .  Hearing loss    .  Wears glasses    .  Rectal bleeding    .  Arthritis    .  Rectal prolapse    .  Hypertension    .  Mitral valve prolapse    .  Spinal stenosis    .  Alzheimer's dementia    .  Hyperlipidemia    .  GERD (gastroesophageal reflux disease)    .  Sciatica    .  Atrial fibrillation      Consultants: [facility] (Dr. Leone Payor)  Procedures: None  Subjective: Patient states pain persists and now going to back. Denies any nausea or vomiting. She points to the upper abdomen. Did have multiple BM's with no change to the pain.   Objective: Vital signs in last 24 hours: Temp:  [97.6 F (36.4 C)-98.5 F (36.9 C)] 97.6 F (36.4 C) (03/03 0534) Pulse Rate:  [79-88] 79  (03/03 0534) Resp:  [16-18] 18  (03/03 0534) BP: (140-152)/(56-79) 148/78 mmHg (03/03 0534) SpO2:  [90 %-93 %] 93 % (03/03 0534) Weight change:  Last BM Date: 12/09/11  Intake/Output from previous day: 03/02 0701 - 03/03 0700 In: 600 [P.O.:600] Out: 1200 [Urine:1200] Intake/Output this shift: Total I/O In: 120 [P.O.:120] Out: 250 [Urine:250]  General appearance: alert, cooperative, distracted and no distress Head: Normocephalic, without obvious abnormality, atraumatic Resp: clear to auscultation bilaterally Cardio: regular rate and rhythm, S1, S2 normal, no murmur, click, rub or gallop GI: Soft, Tender  diffusely. Difficult ro pinpoint due to dementia. Murphy's sign was negative. No masses or organomegaly.  Extremities: extremities normal, atraumatic, no cyanosis or edema Pulses: 2+ and symmetric Skin: Skin color, texture, turgor normal. No rashes or lesions Neurologic: Grossly normal. Confused but at baseline  Lab Results:  Basename 12/10/11 0350 12/09/11 0530  WBC 5.9 6.9  HGB 9.3* 9.1*  HCT 30.8* 30.0*  PLT 178 201   BMET  Basename 12/10/11 0350 12/09/11 0530  NA 136 139  K 4.1 2.9*  CL 105 105  CO2 25 28  GLUCOSE 89 100*  BUN 10 13  CREATININE 0.56 0.53  CALCIUM 8.7 8.5  ALT 38* 34   AP: 500+ Bili: 1.7 GGT: 1342   Studies/Results: US Abdomen Complete  12/09/2011  *RADIOLOGY REPORT*  Clinical Data:  Abdominal pain.  ABDOMINAL ULTRASOUND COMPLETE  Comparison:  CT of the abdomen and pelvis performed 11/27/2011, 02/11/2010 and 09/17/2009, and abdominal ultrasound performed 09/17/2009  Findings:  Gallbladder:  On correlation with multiple prior CTs and the prior abdominal ultrasound, the patient has chronic gallbladder wall thickening, which is again appreciated on the current study.  The gallbladder wall measures up to 0.5 cm in thickness.  A 1.6 cm stone is noted within the gallbladder, without evidence for associated obstruction.  Evaluation for ultrasonographic Murphy's  sign is limited due to patient sedation.  Trace pericholecystic fluid is nonspecific in the presence of mild abdominal ascites; a small amount of fluid is noted at the left upper quadrant, surrounding the spleen.  Common Bile Duct:  0.9 cm in diameter; dilatation of the common hepatic duct appears stable from prior studies dating back to 2010.  Liver:  Diffusely heterogeneous parenchymal echogenicity noted, possibly reflecting fatty infiltration; no focal lesions identified.  Limited Doppler evaluation demonstrates normal blood flow within the liver.  IVC:  Unremarkable in appearance.  Pancreas:  Dilatation of the  pancreatic duct to 0.4 cm is less well characterized on prior studies, though also suggested.  The visualized portions of the pancreas are otherwise unremarkable.  Spleen:  11.7 cm in length; within normal limits in size and echotexture.  Right kidney:  9.7 cm in length; normal in size, configuration and parenchymal echogenicity.  No evidence of mass or hydronephrosis.  Left kidney:  12.9 cm in length; normal in size, configuration and parenchymal echogenicity.  No evidence of mass or hydronephrosis.  Abdominal Aorta:  Normal in caliber; no aneurysm identified.  Small bilateral pleural effusions are noted.  IMPRESSION:  1.  On comparison with prior studies dating back to 2010, there is chronic gallbladder wall thickening, pericholecystic fluid and dilatation of the common hepatic duct; dilatation of the pancreatic duct may also be stable.  Findings may reflect chronic cholecystitis or obstruction, though no distal obstructing stone has been identified; the appearance is unchanged on the current study. 2.  Cholelithiasis noted. 3.  Question of mild fatty infiltration within the liver. 4.  Trace ascites again noted in the abdomen. 5.  Small bilateral pleural effusions seen.  Original Report Authenticated By: Tonia Ghent, M.D.   Dg Abd 2 Views  12/08/2011  *RADIOLOGY REPORT*  Clinical Data: Lower abdominal pain.  ABDOMEN - 2 VIEW  Comparison: Pelvic radiograph performed 03/20/2011, and CT of the abdomen and pelvis performed 11/27/2011  Findings: The visualized bowel gas pattern is unremarkable. Scattered air and stool filled loops of colon are seen; no abnormal dilatation of small bowel loops is seen to suggest small bowel obstruction.  No free intra-abdominal air is identified on the provided upright view.  Left convex lumbar scoliosis is noted, with associated degenerative change.  Diffuse vascular calcifications are seen.  A mesh is noted overlying the pelvis.  Small bilateral pleural effusions are seen, with  associated atelectasis.  Chronically increased interstitial markings are seen; peribronchial thickening and diffuse calcification of the tracheobronchial tree are seen.  The cardiomediastinal silhouette is mildly enlarged.  IMPRESSION:  1.  Unremarkable bowel gas pattern; no free intra-abdominal air seen. 2.  Small bilateral pleural effusions, with associated atelectasis. 3.  Chronic lung changes noted. 4.  Mild cardiomegaly. 5.  Left convex lumbar scoliosis. 6.  Diffuse vascular calcifications seen.  Original Report Authenticated By: Tonia Ghent, M.D.    Medications:  Scheduled:    . bisacodyl  10 mg Rectal Once  . docusate sodium  100 mg Oral BID  . donepezil  5 mg Oral QHS  . fentaNYL  25 mcg Transdermal Q72H  . ferrous sulfate  325 mg Oral QPC breakfast  . hydrocortisone cream   Topical BID  . lactulose  30 g Oral BID  . pantoprazole  40 mg Oral BID AC  . polyethylene glycol  17 g Oral BID  . potassium chloride  40 mEq Oral Q4H  . predniSONE  5 mg Oral Q breakfast  Assessment/Plan:  Active Problems:  HYPERTENSION  ATRIAL FIBRILLATION, PAROXYSMAL  MURMUR  Constipation, chronic  Hemorrhoids, internal, with prolapse & bleeding  Anemia  Esophagitis, erosive  Abdominal pain    1. Abdominal pain: Initially thought to be due to constipation. But now I am not that certain. Her LFT's are trending upwards. Will consult GI. Check Lipase. Gallbladder abnormality noted on [place]. HIDA on Monday remains on schedule for now. Dr. Leone Payor to see the patient later today. Difficult examination due to dementia.  2. Chronic constipation: Has had multiple BM's with bowel regimen.  3. Recent diagnosis of severe ulcerative esophagitis: Continue PPI BID.  4. Atrial fibrillation: Taken off of coumadin in January with plans to reinstitute in 2 weeks. Hasn't been done so far. Unclear why. Will let PCP address this. Should be on aspirin in the interim. Will hold for now due to possibility of  procedure.  5. Anemia: Stable. Monitor. Check anemia panel. Could have chronic GI bleeding.  6. Hypertension: Stable  7. Polymyalgia rheumatica and Temporal arteritis: Patient on chronic steroids which will be continued.   8. Elevated LFTs: Unclear etiology, this is been present since 2010. See discussion above.  9. Full Code  10. DVT prophylaxis: SCD's   Patient is stable. Await GI input.   LOS: 2 days   [facility] Pager [phone] 12/10/2011, 10:45 AM

## 2011-12-11 ENCOUNTER — Observation Stay (HOSPITAL_COMMUNITY): Payer: Medicare Other

## 2011-12-11 ENCOUNTER — Other Ambulatory Visit: Payer: Self-pay

## 2011-12-11 DIAGNOSIS — K8010 Calculus of gallbladder with chronic cholecystitis without obstruction: Secondary | ICD-10-CM

## 2011-12-11 LAB — COMPREHENSIVE METABOLIC PANEL
AST: 66 U/L — ABNORMAL HIGH (ref 0–37)
CO2: 23 mEq/L (ref 19–32)
Calcium: 8.7 mg/dL (ref 8.4–10.5)
Creatinine, Ser: 0.57 mg/dL (ref 0.50–1.10)
GFR calc Af Amer: >90 mL/min (ref >90)
GFR calc non Af Amer: 80 mL/min — ABNORMAL LOW (ref >90)
Glucose, Bld: 84 mg/dL (ref 70–99)
Total Protein: 5.4 g/dL — ABNORMAL LOW (ref 6.0–8.3)

## 2011-12-11 MED ORDER — TECHNETIUM TC 99M MEBROFENIN IV KIT
5.3000 | PACK | Freq: Once | INTRAVENOUS | Status: AC | PRN
  Administered 2011-12-11: 5 via INTRAVENOUS

## 2011-12-11 MED ORDER — CIPROFLOXACIN IN D5W 400 MG/200ML IV SOLN
400.0000 mg | Freq: Two times a day (BID) | INTRAVENOUS | Status: DC
  Administered 2011-12-11 – 2011-12-12 (×2): 400 mg via INTRAVENOUS
  Filled 2011-12-11 (×4): qty 200

## 2011-12-11 MED ORDER — MORPHINE SULFATE 2 MG/ML IJ SOLN
2.0000 mg | Freq: Once | INTRAMUSCULAR | Status: AC
  Administered 2011-12-11: 2 mg via INTRAVENOUS
  Filled 2011-12-11: qty 1

## 2011-12-11 MED ORDER — CEFAZOLIN SODIUM 1-5 GM-% IV SOLN: 1.0000 g | INTRAVENOUS | Status: DC

## 2011-12-11 MED ORDER — DEXTROSE-NACL 5-0.45 % IV SOLN
INTRAVENOUS | Status: DC
  Administered 2011-12-11 – 2011-12-12 (×2): via INTRAVENOUS
  Administered 2011-12-13: 1000 mL via INTRAVENOUS

## 2011-12-11 MED ORDER — CEFAZOLIN SODIUM 1-5 GM-% IV SOLN
1.0000 g | INTRAVENOUS | Status: AC
  Administered 2011-12-12: 1 g via INTRAVENOUS
  Filled 2011-12-11: qty 50

## 2011-12-11 NOTE — Progress Notes (Signed)
[facility] Progress Note  SUBJECTIVE: Complains of ongoing abdominal pain, mid to upper   OBJECTIVE:  Vital signs in last 24 hours: Temp:  [98.2 F (36.8 C)-98.4 F (36.9 C)] 98.4 F (36.9 C) (03/03 2002) Pulse Rate:  [78-86] 78  (03/03 2002) Resp:  [18] 18  (03/03 2002) BP: (113-144)/(47-49) 144/49 mmHg (03/03 2002) SpO2:  [93 %] 93 % (03/03 2002) Last BM Date: 12/09/11 General:    Pleasant white female in NAD Heart:  Regular rate and rhythm, murmur is present.  Abdomen:  Soft, mildly distended with tympany and a few bowel sounds. Mild mid abdominal tenderness. Extremities:  Without edema. Neurologic:  Alert,  grossly normal neurologically. Psych:  Cooperative. Normal mood and affect.  I Lab Results:  Basename 12/10/11 0350 12/09/11 0530 12/08/11 2102  WBC 5.9 6.9 6.2  HGB 9.3* 9.1* 8.7*  HCT 30.8* 30.0* 29.7*  PLT 178 201 224   BMET  Basename 12/11/11 0316 12/10/11 0350 12/09/11 0530  NA 134* 136 139  K 4.2 4.1 2.9*  CL 104 105 105  CO2 23 25 28   GLUCOSE 84 89 100*  BUN 10 10 13   CREATININE 0.57 0.56 0.53  CALCIUM 8.7 8.7 8.5   LFT  Basename 12/11/11 0316 12/09/11 0530  PROT 5.4* --  ALBUMIN 2.6* --  AST 66* --  ALT 39* --  ALKPHOS 574* --  BILITOT 1.3* --  BILIDIR -- 0.7*  IBILI -- 0.7   PT/INR  Basename 12/10/11 1500  LABPROT 14.8  INR 1.14    ASSESSMENT / PLAN:  1.  Abdominal pain, HIDA positive for cystic duct obstruction. Needs surgical evaluation, I have already call the consult.  2.  Chronically elevated LFTs, predominantly cholestatic and worse than in 2010. Alkaline phos was 200 in Dec 2010, now in 550-598 range which maybe secondary to #1 but  other causes not completely excluded. AMA is pending. If remain high. May need to check alk phos isoenzymes if alk phos remains this high after resolution of #1.       LOS: 3 days   Willette Cluster  12/11/2011, 8:25 AM   GI ATTENDING  PATIENT SEEN AND EXAMINED. STILL C/O RUQ PAIN  AND TENDER ON EXAM. NON-TOXIC APPEARRING. HIDA SCAN REVIEWED AND C/W ACALCULOUS CHOLECYSTITIS. DOUBT THIS EXPLAINS ELEVATED ALK PHOS... SURGERY CONSULTED AND IN ROOM SEEING PATIENT. DISCUSSED PLANS WITH SISTER AND PATIENT. CONTINUE PPI FOR H/O EROSIVE ESOPHAGITIS.  Wilhemina Bonito. Eda Keys., M.D. [facility] Division of Gastroenterology

## 2011-12-11 NOTE — Progress Notes (Signed)
UR completed 

## 2011-12-11 NOTE — Progress Notes (Signed)
PCP: [name], MD, MD  Brief HPI:  This is a 78 year old female resident of an independent living center. She came in with complaints of abdominal pain. She states her pain is generalized. She states has been ongoing for approximately 4 months however is gone worse recently. She denies any nausea vomiting or diarrhea, no fever, chills. Patient is a poor historian, she has dementia but she was clear with the fact that her abdomen is hurting.   Past medical history:  Past Medical History   Diagnosis  Date   .  PMR (polymyalgia rheumatica)    .  Temporal arteritis    .  Osteoporosis    .  Hearing loss    .  Wears glasses    .  Rectal bleeding    .  Arthritis    .  Rectal prolapse    .  Hypertension    .  Mitral valve prolapse    .  Spinal stenosis    .  Alzheimer's dementia    .  Hyperlipidemia    .  GERD (gastroesophageal reflux disease)    .  Sciatica    .  Atrial fibrillation      Consultants: [facility] (Dr. Leone Payor)  Procedures: None  Subjective: Patient continues to have pain. No nausea or vomiting.   Objective: Vital signs in last 24 hours: Temp:  [98.4 F (36.9 C)] 98.4 F (36.9 C) (03/03 2002) Pulse Rate:  [78] 78  (03/03 2002) Resp:  [18] 18  (03/03 2002) BP: (144)/(49) 144/49 mmHg (03/03 2002) SpO2:  [93 %] 93 % (03/03 2002) Weight change:  Last BM Date: 12/09/11  Intake/Output from previous day: 03/03 0701 - 03/04 0700 In: 1080 [P.O.:1080] Out: 1360 [Urine:1360] Intake/Output this shift:    General appearance: alert, cooperative, distracted and no distress Head: Normocephalic, without obvious abnormality, atraumatic Resp: clear to auscultation bilaterally Cardio: regular rate and rhythm, S1, S2 normal, no murmur, click, rub or gallop GI: Soft, Tender diffusely. Difficult ro pinpoint due to dementia. Murphy's sign was negative. No masses or organomegaly.  Neurologic: Grossly normal. Confused, but at baseline  Lab Results:  Basename 12/10/11  0350 12/09/11 0530  WBC 5.9 6.9  HGB 9.3* 9.1*  HCT 30.8* 30.0*  PLT 178 201   BMET  Basename 12/11/11 0316 12/10/11 0350  NA 134* 136  K 4.2 4.1  CL 104 105  CO2 23 25  GLUCOSE 84 89  BUN 10 10  CREATININE 0.57 0.56  CALCIUM 8.7 8.7  ALT 39* 38*   AP: 500+ Bili: 1.7 GGT: 1342   Studies/Results: Nm Hepatobiliary  12/11/2011  *RADIOLOGY REPORT*  Clinical Data:  Right upper quadrant abdominal pain.  NUCLEAR MEDICINE HEPATOBILIARY IMAGING  Technique:  Sequential images of the abdomen were obtained out to 60 minutes following intravenous administration of radiopharmaceutical.  Radiopharmaceutical:  5.11mCi Tc-39m Choletec  Comparison:  Ultrasound abdomen 12/08/2011.  Findings: There is symmetric uptake in the liver and prompt excretion into the biliary tree which is visualized at 15 minutes. Activity is seen in the small bowel at 30 minutes.  The gallbladder was never identified.  The patient was given morphine at 1 hour with additional imaging performed but no filling of the gallbladder.  Findings consistent with cystic duct obstruction.  IMPRESSION:  1.  Non-visualization of the gallbladder consistent with cystic duct obstruction. 2.  Patent common bile duct.  Original Report Authenticated By: P. Loralie Champagne, M.D.    Medications:  Scheduled:    .  docusate sodium  100 mg Oral BID  . donepezil  5 mg Oral QHS  . fentaNYL  25 mcg Transdermal Q72H  . ferrous sulfate  325 mg Oral QPC breakfast  . hydrocortisone cream   Topical BID  .  morphine injection  2 mg Intravenous Once  . pantoprazole  40 mg Oral BID AC  . polyethylene glycol  17 g Oral BID  . predniSONE  5 mg Oral Q breakfast    Assessment/Plan:  Active Problems:  HYPERTENSION  ATRIAL FIBRILLATION, PAROXYSMAL  MURMUR  Constipation, chronic  Hemorrhoids, internal, with prolapse & bleeding  Anemia  Esophagitis, erosive  Abdominal pain  elevated alkaline phosphatase  Nonspecific elevation of levels of  transaminase or lactic acid dehydrogenase (LDH)  Thickened gallbladder wall and chronically dilated bile duct  RUQ abdominal tenderness  Ascites    1. Abdominal pain due ?Cholecystitis: Initially thought to be due to constipation. But her LFT's were trending upwards. GI was consulted and HIDA was done today. Results as above. Surgery called and they are evaluating for possible cholecystectomy. Start Cipro.   2. Chronic constipation: Has had multiple BM's with bowel regimen.  3. Recent diagnosis of severe ulcerative esophagitis: Continue PPI BID.  4. Atrial fibrillation: Taken off of coumadin in January with plans to reinstitute in 2 weeks. Hasn't been done so far. Unclear why. Will let PCP address this. Should be on aspirin in the interim. Will hold for now due to possibility of procedure. Check EKG.  5. Anemia: Stable. Monitor. Anemia panel pending.   6. Hypertension: Stable  7. Polymyalgia rheumatica and Temporal arteritis: Patient on chronic steroids which will be continued.   8. Full Code  9. DVT prophylaxis: SCD's   Patient is stable.    LOS: 3 days   [facility] Pager [phone] 12/11/2011, 2:56 PM

## 2011-12-11 NOTE — Consult Note (Signed)
Reason for Consult:Cholecystitis, cholelithiasis, elevated LFT's Referring Physician: [name] is an 78 y.o. female.  HPI: Patient's 78 year old female lives in an independent living center. She's had abdominal pain ongoing for 4 months. In January 2013 she underwent EGD that showed esophagitis. She has a history of related liver function studies going back to 2010. It was initially thought that this abdominal pain was related to constipation. She has a history of LFT elevation, chronically thickened gallbladder chronic dilated bile duct pericholecystic fluid and abdominal ascites. There was a concern for cholelithiasis choledocholithiasis on Abdominal ultrasound was obtained 12/09/11.   A HIDA scan has been obtained by GI. This showed prompt uptake and excretion into the biliary tree and 15 minutes. There is activity seen in the small bowel at 30 minutes. Morphine was given but there was never any visualization of the gallbladder. Consistent with cystic duct obstruction. Abdominal ultrasound she shows chronic wall thickening up to 0.5 cm in thickness. A 1.6 cm stone as noted within gallbladder without evidence for obstruction. There is a trace of pericholecystic fluid abdominal ascites is present. The common bile duct is 0.9 cm and dilatation going back to 2010. The liver was diffusely heterogeneous  with a question of fatty infiltration. Limited Doppler shows normal flow within the liver. Pancreatic duct was 0.4 cm. Surgery was asked to see in consultation for cholecystitis,  & cholelithiasis.  Past Medical History  Diagnosis Date  . PMR (polymyalgia rheumatica)   . Temporal arteritis   . Osteoporosis   . Hearing loss   . Wears glasses   . Arthritis   . Rectal prolapse/ Rectal bleeding none recently   . Hypertension   . Mitral valve prolapse   . Spinal stenosis   . Alzheimer's dementia   . Hyperlipidemia   . GERD (gastroesophageal reflux disease)/ Esophagitis  11/02/11 ion EGD.  [name]  Sciatica   . Atrial fibrillation on Chronic Coumadin till EGD and bleed.   11/02/11    Past Surgical History  Procedure Date  . Abdominal hysterectomy   . Appendectomy   . Colonoscopy   . Colon surgery 11/16/2010    Lap sigmoid colectomy & biologic mesh rectopexy  . Breast surgery   . Hernia repair     LIH  . Esophagogastroduodenoscopy 11/02/2011    Procedure: ESOPHAGOGASTRODUODENOSCOPY (EGD);  Surgeon: Yancey Flemings, MD;  Location: [facility] ENDOSCOPY;  Service: Endoscopy;  Laterality: N/A;    Family History  Problem Relation Age of Onset  . Cancer Brother     bladder    Social History:  reports that she has never smoked. She does not have any smokeless tobacco history on file. She reports that she does not drink alcohol or use illicit drugs.  Allergies:  Allergies  Allergen Reactions  . Fosamax     Medications:  I have reviewed the patient's current medications. Prior to Admission:  Prescriptions prior to admission  Medication Sig Dispense Refill  . aspirin EC 81 MG tablet Take 1 tablet (81 mg total) by mouth daily.      . Calcium Carbonate-Vitamin D (CALCIUM + D PO) Take 750 mg by mouth 2 (two) times daily.        [name] docusate sodium (COLACE) 100 MG capsule Take 100 mg by mouth 2 (two) times daily.        [name] donepezil (ARICEPT) 5 MG tablet Take 5 mg by mouth at bedtime as needed. For anxiety      . ESTRACE VAGINAL 0.1 MG/GM vaginal  cream Once a week.      . fentaNYL (DURAGESIC - DOSED MCG/HR) 25 MCG/HR Place 1 patch onto the skin every 3 (three) days.      . ferrous sulfate 325 (65 FE) MG tablet Take 325 mg by mouth daily with breakfast.      . HYDROcodone-acetaminophen (VICODIN) 5-500 MG per tablet Take 1 tablet by mouth every 6 (six) hours as needed. For pain      . Multiple Vitamin (MULITIVITAMIN WITH MINERALS) TABS Take 1 tablet by mouth daily.      . pantoprazole (PROTONIX) 40 MG tablet Take 1 tablet (40 mg total) by mouth 2 (two) times daily.  60 tablet  3  . predniSONE  (DELTASONE) 5 MG tablet Take 5 mg by mouth daily.         Scheduled:   . ciprofloxacin  400 mg Intravenous Q12H  . docusate sodium  100 mg Oral BID  . donepezil  5 mg Oral QHS  . fentaNYL  25 mcg Transdermal Q72H  . ferrous sulfate  325 mg Oral QPC breakfast  . hydrocortisone cream   Topical BID  .  morphine injection  2 mg Intravenous Once  . pantoprazole  40 mg Oral BID AC  . polyethylene glycol  17 g Oral BID  . predniSONE  5 mg Oral Q breakfast   Continuous:   . dextrose 5 % and 0.45% NaCl     AVW:[id number], HYDROcodone-acetaminophen, morphine injection, technetium TC 7M mebrofenin  Results for orders placed during the hospital encounter of 12/08/11 (from the past 48 hour(s))  CBC     Status: Abnormal   Collection Time   12/10/11  3:50 AM      Component Value Range Comment   WBC 5.9  4.0 - 10.5 (K/uL)    RBC 3.56 (*) 3.87 - 5.11 (MIL/uL)    Hemoglobin 9.3 (*) 12.0 - 15.0 (g/dL)    HCT 82.9 (*) 56.2 - 46.0 (%)    MCV 86.5  78.0 - 100.0 (fL)    MCH 26.1  26.0 - 34.0 (pg)    MCHC 30.2  30.0 - 36.0 (g/dL)    RDW 13.0 (*) 86.5 - 15.5 (%)    Platelets 178  150 - 400 (K/uL)   COMPREHENSIVE METABOLIC PANEL     Status: Abnormal   Collection Time   12/10/11  3:50 AM      Component Value Range Comment   Sodium 136  135 - 145 (mEq/L)    Potassium 4.1  3.5 - 5.1 (mEq/L)    Chloride 105  96 - 112 (mEq/L)    CO2 25  19 - 32 (mEq/L)    Glucose, Bld 89  70 - 99 (mg/dL)    BUN 10  6 - 23 (mg/dL)    Creatinine, Ser 7.84  0.50 - 1.10 (mg/dL)    Calcium 8.7  8.4 - 10.5 (mg/dL)    Total Protein 5.8 (*) 6.0 - 8.3 (g/dL)    Albumin 2.8 (*) 3.5 - 5.2 (g/dL)    AST 62 (*) 0 - 37 (U/L)    ALT 38 (*) 0 - 35 (U/L)    Alkaline Phosphatase 588 (*) 39 - 117 (U/L)    Total Bilirubin 1.7 (*) 0.3 - 1.2 (mg/dL)    GFR calc non Af Amer 81 (*) >90 (mL/min)    GFR calc Af Amer >90  >90 (mL/min)   LIPASE, BLOOD     Status: Normal   Collection Time  12/10/11  3:50 AM      Component Value Range  Comment   Lipase 26  11 - 59 (U/L)   PROTIME-INR     Status: Normal   Collection Time   12/10/11  3:00 PM      Component Value Range Comment   Prothrombin Time 14.8  11.6 - 15.2 (seconds)    INR 1.14  0.00 - 1.49    COMPREHENSIVE METABOLIC PANEL     Status: Abnormal   Collection Time   12/11/11  3:16 AM      Component Value Range Comment   Sodium 134 (*) 135 - 145 (mEq/L)    Potassium 4.2  3.5 - 5.1 (mEq/L)    Chloride 104  96 - 112 (mEq/L)    CO2 23  19 - 32 (mEq/L)    Glucose, Bld 84  70 - 99 (mg/dL)    BUN 10  6 - 23 (mg/dL)    Creatinine, Ser 1.61  0.50 - 1.10 (mg/dL)    Calcium 8.7  8.4 - 10.5 (mg/dL)    Total Protein 5.4 (*) 6.0 - 8.3 (g/dL)    Albumin 2.6 (*) 3.5 - 5.2 (g/dL)    AST 66 (*) 0 - 37 (U/L)    ALT 39 (*) 0 - 35 (U/L)    Alkaline Phosphatase 574 (*) 39 - 117 (U/L)    Total Bilirubin 1.3 (*) 0.3 - 1.2 (mg/dL)    GFR calc non Af Amer 80 (*) >90 (mL/min)    GFR calc Af Amer >90  >90 (mL/min)   LIPASE, BLOOD     Status: Normal   Collection Time   12/11/11  3:16 AM      Component Value Range Comment   Lipase 29  11 - 59 (U/L)     Lab 12/12/11 0350 12/11/11 0316 12/10/11 0350 12/09/11 0530 12/08/11 2102  AST 72* 66* 62* 54* 58*  ALT 43* 39* 38* 34 37*  ALKPHOS 583* 574* 588* 553* 598*  BILITOT 1.3* 1.3* 1.7* 1.4* 1.2  PROT 5.4* 5.4* 5.8* 5.8* 6.2  ALBUMIN 2.6* 2.6* 2.8* 2.8* 3.1*    Nm Hepatobiliary  12/11/2011  *RADIOLOGY REPORT*  Clinical Data:  Right upper quadrant abdominal pain.  NUCLEAR MEDICINE HEPATOBILIARY IMAGING  Technique:  Sequential images of the abdomen were obtained out to 60 minutes following intravenous administration of radiopharmaceutical.  Radiopharmaceutical:  5.16mCi Tc-3m Choletec  Comparison:  Ultrasound abdomen 12/08/2011.  Findings: There is symmetric uptake in the liver and prompt excretion into the biliary tree which is visualized at 15 minutes. Activity is seen in the small bowel at 30 minutes.  The gallbladder was never identified.  The  patient was given morphine at 1 hour with additional imaging performed but no filling of the gallbladder.  Findings consistent with cystic duct obstruction.  IMPRESSION:  1.  Non-visualization of the gallbladder consistent with cystic duct obstruction. 2.  Patent common bile duct.  Original Report Authenticated By: P. Loralie Champagne, M.D.    Review of Systems  Constitutional: Negative.   Eyes: Negative.   Respiratory: Negative.   Cardiovascular: Negative.   Gastrointestinal: Positive for heartburn, abdominal pain and constipation (sounds like she's been on hydrocodone for  over a month). Negative for nausea and vomiting.  Genitourinary: Negative.   Musculoskeletal: Negative.   Skin: Negative.   Neurological: Negative.        Memory loss from Alzheimer's dz.  Endo/Heme/Allergies: Negative.   Psychiatric/Behavioral: Negative.    Blood pressure 144/49, pulse  78, temperature 98.4 F (36.9 C), temperature source Oral, resp. rate 18, height 5\' 6"  (1.676 m), weight 46.72 kg (103 lb), SpO2 93.00%. Physical Exam  Constitutional:       Thin frail cachetic 78 y/o in NAD.  HENT:  Head: Normocephalic.  Nose: Nose normal.  Eyes: Conjunctivae and EOM are normal. Pupils are equal, round, and reactive to light. Left eye exhibits no discharge. No scleral icterus.  Neck: Normal range of motion. Neck supple. No JVD present. No tracheal deviation present. No thyromegaly present.       Soft bruit  Cardiovascular: Normal rate and regular rhythm.  Exam reveals no gallop and no friction rub.   Murmur (III/VI loudest at [place], herard throughout chest.) heard. Respiratory: Effort normal. No respiratory distress. She has no wheezes. She has rales. She exhibits no tenderness.  GI: Soft. Bowel sounds are normal. She exhibits distension (minimal). She exhibits no mass. There is tenderness ( mid abdomen, more than RUQ). There is no rebound and no guarding.  Musculoskeletal: She exhibits no edema and no tenderness.    Lymphadenopathy:    She has no cervical adenopathy.  Neurological: She is alert. No cranial nerve deficit.  Skin: Skin is warm and dry. No rash noted. No erythema. No pallor.  Psychiatric: She has a normal mood and affect. Her behavior is normal. Judgment and thought content normal.       Some memory issues, her sister is helping.    Assessment/Plan: 1.Cholelithiasis, elevated LFT's, ongoing abdominal pain/constipation;  s/p appendectomy, Ventral hernia repair RUQ,/LAP, sigmoid colectomy and biologic mesh rectopexy 11/26/10. 2.Esophagitis by EGD 11/02/11 3.GERD 4.Polymyalgia Rheumatica 5.  Atrial Fib, with prior Coumadin use; stopped Jan. 2013. 6.  Polymyalgia Rheumatica/hx temporal arteritis 7.Alzheimers Dementia 8.Mitral Valve prolapse 9.Spinal Stenosis/Scitica 10. Dyslipidemia  Plan:  Will discuss Cholecystectomy.  Will  need meical clearance. She's had 3 prior abdominal surgeries, is uncertain whether we'll be able to do this laparoscopically with multiple prior surgeries.  Dr. Michaell Cowing will see and discuss. Tammy Myers,Tammy Myers 12/11/2011, 3:29 PM   With change in U/S and +biliary Sx, I think the pt will need chole.  Prob lap candidate See if medically OK to do this  Ardeth Sportsman, M.D., F.A.C.S. Gastrointestinal and Minimally Invasive Surgery [facility] [address], [address] [place], [place] [address] [phone] [address] / Paging [phone] Voice Mail

## 2011-12-12 ENCOUNTER — Encounter (HOSPITAL_COMMUNITY): Payer: Self-pay | Admitting: Surgery

## 2011-12-12 ENCOUNTER — Encounter (HOSPITAL_COMMUNITY)
Admission: EM | Disposition: A | Discharge status: Skilled Nursing Facility | Payer: Self-pay | Source: Home / Self Care | Attending: Internal Medicine

## 2011-12-12 ENCOUNTER — Telehealth (INDEPENDENT_AMBULATORY_CARE_PROVIDER_SITE_OTHER): Payer: Self-pay

## 2011-12-12 ENCOUNTER — Inpatient Hospital Stay (HOSPITAL_COMMUNITY): Payer: Medicare Other | Admitting: Anesthesiology

## 2011-12-12 ENCOUNTER — Inpatient Hospital Stay (HOSPITAL_COMMUNITY): Payer: Medicare Other

## 2011-12-12 ENCOUNTER — Encounter (HOSPITAL_COMMUNITY): Payer: Self-pay | Admitting: Anesthesiology

## 2011-12-12 DIAGNOSIS — K8010 Calculus of gallbladder with chronic cholecystitis without obstruction: Secondary | ICD-10-CM | POA: Diagnosis present

## 2011-12-12 HISTORY — PX: CHOLECYSTECTOMY: SHX55

## 2011-12-12 LAB — ANTI-NUCLEAR AB-TITER (ANA TITER)

## 2011-12-12 LAB — COMPREHENSIVE METABOLIC PANEL
ALT: 43 U/L — ABNORMAL HIGH (ref 0–35)
AST: 72 U/L — ABNORMAL HIGH (ref 0–37)
Albumin: 2.6 g/dL — ABNORMAL LOW (ref 3.5–5.2)
Alkaline Phosphatase: 583 U/L — ABNORMAL HIGH (ref 39–117)
Potassium: 3.9 mEq/L (ref 3.5–5.1)
Sodium: 134 mEq/L — ABNORMAL LOW (ref 135–145)
Total Protein: 5.4 g/dL — ABNORMAL LOW (ref 6.0–8.3)

## 2011-12-12 LAB — CBC
HCT: 33 % — ABNORMAL LOW (ref 36.0–46.0)
MCH: 26.4 pg (ref 26.0–34.0)
MCHC: 30.7 g/dL (ref 30.0–36.0)
MCHC: 30.6 g/dL (ref 30.0–36.0)
MCV: 86.4 fL (ref 78.0–100.0)
RDW: 20 % — ABNORMAL HIGH (ref 11.5–15.5)
RDW: 20 % — ABNORMAL HIGH (ref 11.5–15.5)
WBC: 4.8 10*3/uL (ref 4.0–10.5)
WBC: 5.5 10*3/uL (ref 4.0–10.5)

## 2011-12-12 LAB — ANA: Anti Nuclear Antibody(ANA): POSITIVE — AB

## 2011-12-12 SURGERY — LAPAROSCOPIC CHOLECYSTECTOMY SINGLE SITE
Anesthesia: General | Site: Abdomen | Wound class: Clean Contaminated

## 2011-12-12 MED ORDER — FENTANYL CITRATE 0.05 MG/ML IJ SOLN
INTRAMUSCULAR | Status: AC
  Filled 2011-12-12: qty 2

## 2011-12-12 MED ORDER — ONDANSETRON HCL 4 MG/2ML IJ SOLN
INTRAMUSCULAR | Status: DC | PRN
  Administered 2011-12-12: 4 mg via INTRAVENOUS

## 2011-12-12 MED ORDER — SUCCINYLCHOLINE CHLORIDE 20 MG/ML IJ SOLN
INTRAMUSCULAR | Status: DC | PRN
  Administered 2011-12-12: 60 mg via INTRAVENOUS

## 2011-12-12 MED ORDER — FENTANYL CITRATE 0.05 MG/ML IJ SOLN
INTRAMUSCULAR | Status: DC | PRN
  Administered 2011-12-12: 50 ug via INTRAVENOUS
  Administered 2011-12-12 (×2): 25 ug via INTRAVENOUS

## 2011-12-12 MED ORDER — LIDOCAINE HCL (CARDIAC) 20 MG/ML IV SOLN
INTRAVENOUS | Status: DC | PRN
  Administered 2011-12-12: 50 mg via INTRAVENOUS

## 2011-12-12 MED ORDER — BUPIVACAINE-EPINEPHRINE 0.25% -1:200000 IJ SOLN
INTRAMUSCULAR | Status: AC
  Filled 2011-12-12: qty 1

## 2011-12-12 MED ORDER — PROPOFOL 10 MG/ML IV BOLUS
INTRAVENOUS | Status: DC | PRN
  Administered 2011-12-12: 50 mg via INTRAVENOUS

## 2011-12-12 MED ORDER — FENTANYL CITRATE 0.05 MG/ML IJ SOLN
25.0000 ug | INTRAMUSCULAR | Status: DC | PRN
  Administered 2011-12-12 – 2011-12-13 (×3): 25 ug via INTRAVENOUS

## 2011-12-12 MED ORDER — ETOMIDATE 2 MG/ML IV SOLN
INTRAVENOUS | Status: DC | PRN
  Administered 2011-12-12: 10 mg via INTRAVENOUS

## 2011-12-12 MED ORDER — LACTATED RINGERS IV SOLN
INTRAVENOUS | Status: DC | PRN
  Administered 2011-12-12 (×2): via INTRAVENOUS

## 2011-12-12 MED ORDER — BUPIVACAINE-EPINEPHRINE 0.25% -1:200000 IJ SOLN
INTRAMUSCULAR | Status: DC | PRN
  Administered 2011-12-12: 50 mL

## 2011-12-12 MED ORDER — ROCURONIUM BROMIDE 100 MG/10ML IV SOLN
INTRAVENOUS | Status: DC | PRN
  Administered 2011-12-12: 20 mg via INTRAVENOUS
  Administered 2011-12-12: 5 mg via INTRAVENOUS

## 2011-12-12 MED ORDER — LACTATED RINGERS IV SOLN: INTRAVENOUS | Status: DC

## 2011-12-12 MED ORDER — ACETAMINOPHEN 325 MG PO TABS
650.0000 mg | ORAL_TABLET | Freq: Four times a day (QID) | ORAL | Status: DC
  Administered 2011-12-13 – 2011-12-15 (×9): 650 mg via ORAL
  Filled 2011-12-12 (×15): qty 2

## 2011-12-12 MED ORDER — LIP MEDEX EX OINT
1.0000 "application " | TOPICAL_OINTMENT | Freq: Two times a day (BID) | CUTANEOUS | Status: DC
  Administered 2011-12-12 – 2011-12-15 (×6): 1 via TOPICAL
  Filled 2011-12-12: qty 7

## 2011-12-12 MED ORDER — LACTATED RINGERS IR SOLN
Status: DC | PRN
  Administered 2011-12-12: 3000 mL

## 2011-12-12 MED ORDER — MAGIC MOUTHWASH
15.0000 mL | Freq: Four times a day (QID) | ORAL | Status: DC | PRN
  Filled 2011-12-12: qty 15

## 2011-12-12 MED ORDER — HYDROCORTISONE SOD SUCCINATE 100 MG IJ SOLR
INTRAMUSCULAR | Status: AC
  Filled 2011-12-12: qty 2

## 2011-12-12 MED ORDER — CIPROFLOXACIN IN D5W 400 MG/200ML IV SOLN
400.0000 mg | Freq: Two times a day (BID) | INTRAVENOUS | Status: AC
  Administered 2011-12-13 (×2): 400 mg via INTRAVENOUS
  Filled 2011-12-12 (×2): qty 200

## 2011-12-12 MED ORDER — SODIUM CHLORIDE 0.9 % IJ SOLN
INTRAMUSCULAR | Status: DC | PRN
  Administered 2011-12-12: 14:00:00

## 2011-12-12 MED ORDER — IOHEXOL 300 MG/ML  SOLN
INTRAMUSCULAR | Status: AC
  Filled 2011-12-12: qty 1

## 2011-12-12 MED ORDER — HYDROCORTISONE SOD SUCCINATE 100 MG IJ SOLR
50.0000 mg | Freq: Three times a day (TID) | INTRAMUSCULAR | Status: DC
  Administered 2011-12-12: 50 mg via INTRAVENOUS
  Administered 2011-12-12: 100 mg via INTRAVENOUS
  Administered 2011-12-13 (×3): 50 mg via INTRAVENOUS
  Filled 2011-12-12 (×8): qty 1

## 2011-12-12 SURGICAL SUPPLY — 40 items
APPLIER CLIP ROT 10 11.4 M/L (STAPLE) ×2
CABLE HIGH FREQUENCY MONO STRZ (ELECTRODE) ×2 IMPLANT
CANISTER SUCTION 2500CC (MISCELLANEOUS) ×2 IMPLANT
CLIP APPLIE ROT 10 11.4 M/L (STAPLE) ×1 IMPLANT
CLOTH BEACON ORANGE TIMEOUT ST (SAFETY) ×2 IMPLANT
COVER MAYO STAND STRL (DRAPES) ×2 IMPLANT
DECANTER SPIKE VIAL GLASS SM (MISCELLANEOUS) ×2 IMPLANT
DRAPE C-ARM 42X72 X-RAY (DRAPES) ×2 IMPLANT
DRAPE LAPAROSCOPIC ABDOMINAL (DRAPES) ×2 IMPLANT
DRSG TEGADERM 2-3/8X2-3/4 SM (GAUZE/BANDAGES/DRESSINGS) IMPLANT
DRSG TEGADERM 4X4.75 (GAUZE/BANDAGES/DRESSINGS) ×2 IMPLANT
ELECT REM PT RETURN 9FT ADLT (ELECTROSURGICAL) ×2
ELECTRODE REM PT RTRN 9FT ADLT (ELECTROSURGICAL) ×1 IMPLANT
GAUZE SPONGE 2X2 8PLY STRL LF (GAUZE/BANDAGES/DRESSINGS) ×1 IMPLANT
GLOVE BIOGEL PI IND STRL 7.0 (GLOVE) IMPLANT
GLOVE BIOGEL PI INDICATOR 7.0 (GLOVE)
GLOVE ECLIPSE 8.0 STRL XLNG CF (GLOVE) ×2 IMPLANT
GLOVE INDICATOR 8.0 STRL GRN (GLOVE) ×2 IMPLANT
GOWN STRL NON-REIN LRG LVL3 (GOWN DISPOSABLE) ×2 IMPLANT
GOWN STRL REIN XL XLG (GOWN DISPOSABLE) ×4 IMPLANT
IV LACTATED RINGER IRRG 3000ML (IV SOLUTION) ×1
IV LR IRRIG 3000ML ARTHROMATIC (IV SOLUTION) ×1 IMPLANT
KIT BASIN OR (CUSTOM PROCEDURE TRAY) ×2 IMPLANT
NS IRRIG 1000ML POUR BTL (IV SOLUTION) ×2 IMPLANT
POUCH SPECIMEN RETRIEVAL 10MM (ENDOMECHANICALS) IMPLANT
SCISSORS LAP 5X35 DISP (ENDOMECHANICALS) ×2 IMPLANT
SET CHOLANGIOGRAPH MIX (MISCELLANEOUS) IMPLANT
SET IRRIG TUBING LAPAROSCOPIC (IRRIGATION / IRRIGATOR) ×2 IMPLANT
SLEEVE Z-THREAD 5X100MM (TROCAR) ×2 IMPLANT
SOLUTION ANTI FOG 6CC (MISCELLANEOUS) ×2 IMPLANT
SPONGE GAUZE 2X2 STER 10/PKG (GAUZE/BANDAGES/DRESSINGS) ×1
STRIP CLOSURE SKIN 1/2X4 (GAUZE/BANDAGES/DRESSINGS) IMPLANT
SUT MNCRL AB 4-0 PS2 18 (SUTURE) ×2 IMPLANT
SUT VICRYL 0 TIES 12 18 (SUTURE) ×2 IMPLANT
TOWEL OR 17X26 10 PK STRL BLUE (TOWEL DISPOSABLE) ×4 IMPLANT
TRAY LAP CHOLE (CUSTOM PROCEDURE TRAY) ×2 IMPLANT
TROCAR 5M 150ML BLDLS (TROCAR) ×2 IMPLANT
TROCAR Z-THREAD FIOS 11X100 BL (TROCAR) IMPLANT
TROCAR Z-THREAD FIOS 5X100MM (TROCAR) IMPLANT
TUBING INSUFFLATION 10FT LAP (TUBING) ×2 IMPLANT

## 2011-12-12 NOTE — Evaluation (Signed)
Physical Therapy Evaluation Patient Details Name: Tammy Myers MRN: [id number] DOB: Mar 28, 1921 Today's Date: 12/12/2011  Problem List:  Patient Active Problem List  Diagnoses  . HYPERTENSION  . ATRIAL FIBRILLATION, PAROXYSMAL  . OSTEOARTHRITIS  . MURMUR  . Constipation, chronic  . Hemorrhoids, internal, with prolapse & bleeding  . Anemia  . Esophagitis, erosive  . Abdominal pain  . elevated alkaline phosphatase  . Nonspecific elevation of levels of transaminase or lactic acid dehydrogenase (LDH)  . Thickened gallbladder wall and chronically dilated bile duct  . RUQ abdominal tenderness  . Ascites    Past Medical History:  Past Medical History  Diagnosis Date  . PMR (polymyalgia rheumatica)   . Temporal arteritis   . Osteoporosis   . Hearing loss   . Wears glasses   . Rectal bleeding   . Arthritis   . Rectal prolapse   . Hypertension   . Mitral valve prolapse   . Spinal stenosis   . Alzheimer's dementia   . Hyperlipidemia   . GERD (gastroesophageal reflux disease)   . Sciatica   . Atrial fibrillation    Past Surgical History:  Past Surgical History  Procedure Date  . Abdominal hysterectomy   . Appendectomy   . Colonoscopy   . Colon surgery 11/16/2010    Lap sigmoid colectomy & biologic mesh rectopexy  . Breast surgery   . Hernia repair     LIH  . Esophagogastroduodenoscopy 11/02/2011    Procedure: ESOPHAGOGASTRODUODENOSCOPY (EGD);  Surgeon: Yancey Flemings, MD;  Location[facility] ENDOSCOPY;  Service: Endoscopy;  Laterality: N/A;    PT Assessment/Plan/Recommendation PT Assessment Clinical Impression Statement: Elderly female with diffuse muscle atrophy and kyphosis and forward head in standing who is deconditioned from illness.  Hopefully abdominal pain will improve after surgery and patient will return to independent living situation.  She likely will benefit from HHPT at D/C PT Recommendation/Assessment: Patient will need skilled PT in the acute care venue PT  Problem List: Decreased strength;Decreased activity tolerance PT Therapy Diagnosis : Generalized weakness PT Plan PT Frequency: Min 3X/week PT Treatment/Interventions: DME instruction;Gait training;Functional mobility training;Therapeutic exercise PT Recommendation Follow Up Recommendations: Home health PT Equipment Recommended: Other (comment);None recommended by PT (toilet DME TBA further) PT Goals  Acute Rehab PT Goals PT Goal Formulation: With patient Time For Goal Achievement: 2 weeks Pt will go Supine/Side to Sit: Independently PT Goal: Supine/Side to Sit - Progress: Goal set today Pt will go Sit to Supine/Side: Independently PT Goal: Sit to Supine/Side - Progress: Goal set today Pt will go Sit to Stand: Independently PT Goal: Sit to Stand - Progress: Goal set today Pt will go Stand to Sit: Independently PT Goal: Stand to Sit - Progress: Goal set today Pt will Ambulate: >150 feet;with modified independence;with least restrictive assistive device PT Goal: Ambulate - Progress: Goal set today  PT Evaluation Precautions/Restrictions  Restrictions Weight Bearing Restrictions: No Prior Functioning  Home Living Lives With: Alone Receives Help From: Other (Comment) (sister lives on the same hall) Type of Home: Independent living facility Bathroom Shower/Tub: Tub/shower unit;Other (comment) (grab bars inside) Bathroom Toilet: Standard (grab bars around toilet) Home Adaptive Equipment: Walker - four wheeled;Grab bars around toilet;Grab bars in shower;Shower chair with back Prior Function Level of Independence: Requires assistive device for independence Cognition Cognition Arousal/Alertness: Awake/alert Overall Cognitive Status: Appears within functional limits for tasks assessed Orientation Level: Oriented X4 Sensation/Coordination Sensation Light Touch: Appears Intact Extremity Assessment RUE Assessment RUE Assessment: Within Functional Limits LUE Assessment LUE  Assessment:  Within Functional Limits RLE Assessment RLE Assessment: Exceptions to [facility] RLE AROM (degrees) RLE Overall AROM Comments: muscle atrophy with  strength < 3+/5 thoughout LLE Assessment LLE Assessment: Exceptions to WFL LLE AROM (degrees) LLE Overall AROM Comments: muscle atrophy with strength <3+/5 Mobility (including Balance) Bed Mobility Bed Mobility: Yes Supine to Sit: 5: Supervision Sit to Supine: 5: Supervision Transfers Transfers: Yes Sit to Stand: 4: Min assist;With upper extremity assist Sit to Stand Details (indicate cue type and reason): pt needs some assist comig up from low bed.  Pt has not eaten much the past few days and is NPO today awaiting gall bladder surgery so she feels weakened Stand to Sit: 4: Min assist;With upper extremity assist Stand to Sit Details: to control descent Ambulation/Gait Ambulation/Gait: Yes Ambulation/Gait Assistance: 5: Supervision Ambulation/Gait Assistance Details (indicate cue type and reason): pt with thoracokyphoscoliosis and forward head in upright with generalized muscle atrophy, but she is able to ambulate with RW Ambulation Distance (Feet): 200 Feet Assistive device: Rolling walker Gait Pattern: Step-through pattern Stairs: No Wheelchair Mobility Wheelchair Mobility: No  Posture/Postural Control Posture/Postural Control: Postural limitations Postural Limitations: diffuse muscle atrophy and deconditioning with forward head and thoracic kyphoscoliosis Balance Balance Assessed: No Exercise  General Exercises - Lower Extremity Ankle Circles/Pumps: AROM;Both;5 reps;Supine Quad Sets: AROM;Both;5 reps;Supine Gluteal Sets: AROM;5 reps;Both;Supine Hip Flexion/Marching: AROM;5 reps;Both;Supine End of Session PT - End of Session Equipment Utilized During Treatment: Gait belt Activity Tolerance: Patient tolerated treatment well Patient left: in bed;with family/visitor present;with call bell in reach Nurse Communication:  Mobility status for ambulation General Behavior During Session: [facility] for tasks performed Cognition: [facility] for tasks performed  Donnetta Hail 12/12/2011, 12:25 PM

## 2011-12-12 NOTE — Evaluation (Signed)
Occupational Therapy Evaluation Patient Details Name: Tammy Myers MRN: [id number] DOB: [birth year] Today's Date: 12/12/2011  Problem List:  Patient Active Problem List  Diagnoses  . HYPERTENSION  . ATRIAL FIBRILLATION, PAROXYSMAL  . OSTEOARTHRITIS  . MURMUR  . Constipation, chronic  . Hemorrhoids, internal, with prolapse & bleeding  . Anemia  . Esophagitis, erosive  . Abdominal pain  . elevated alkaline phosphatase  . Nonspecific elevation of levels of transaminase or lactic acid dehydrogenase (LDH)  . Thickened gallbladder wall and chronically dilated bile duct  . RUQ abdominal tenderness  . Ascites    Past Medical History:  Past Medical History  Diagnosis Date  . PMR (polymyalgia rheumatica)   . Temporal arteritis   . Osteoporosis   . Hearing loss   . Wears glasses   . Rectal bleeding   . Arthritis   . Rectal prolapse   . Hypertension   . Mitral valve prolapse   . Spinal stenosis   . Alzheimer's dementia   . Hyperlipidemia   . GERD (gastroesophageal reflux disease)   . Sciatica   . Atrial fibrillation    Past Surgical History:  Past Surgical History  Procedure Date  . Abdominal hysterectomy   . Appendectomy   . Colonoscopy   . Colon surgery 11/16/2010    Lap sigmoid colectomy & biologic mesh rectopexy  . Breast surgery   . Hernia repair     LIH  . Esophagogastroduodenoscopy 11/02/2011    Procedure: ESOPHAGOGASTRODUODENOSCOPY (EGD);  Surgeon: Yancey Flemings, MD;  Location: [facility] ENDOSCOPY;  Service: Endoscopy;  Laterality: N/A;    OT Assessment/Plan/Recommendation OT Assessment Clinical Impression Statement: Pt will benefit from skilled OT services to increase her independence before discharge to next venue of care.  OT Recommendation/Assessment: Patient will need skilled OT in the acute care venue OT Problem List: Decreased strength;Decreased knowledge of use of DME or AE OT Therapy Diagnosis : Generalized weakness OT Plan OT Frequency: Min 2X/week OT  Treatment/Interventions: Self-care/ADL training;Therapeutic activities;DME and/or AE instruction;Patient/family education OT Recommendation Follow Up Recommendations: Home health OT versus Skilled nursing facility;Other (comment) (HHOT versus rehab at [facility] depending on progress) Equipment Recommended: Other (comment) (toilet DME TBA further) Individuals Consulted Consulted and Agree with Results and Recommendations: Patient OT Goals Acute Rehab OT Goals OT Goal Formulation: With patient Time For Goal Achievement: 2 weeks ADL Goals Pt Will Perform Grooming: with supervision;Standing at sink ADL Goal: Grooming - Progress: Goal set today Pt Will Perform Upper Body Bathing: with supervision;Other (comment);Sitting, chair (own setup of all items) ADL Goal: Upper Body Bathing - Progress: Goal set today Pt Will Perform Lower Body Bathing: with supervision;Sit to stand from chair;Sit to stand from bed;Other (comment) (own setup) ADL Goal: Lower Body Bathing - Progress: Goal set today Pt Will Perform Upper Body Dressing: with supervision;Other (comment) (own setup) ADL Goal: Upper Body Dressing - Progress: Goal set today Pt Will Perform Lower Body Dressing: with supervision;Sit to stand from chair;Sit to stand from bed ADL Goal: Lower Body Dressing - Progress: Goal set today Pt Will Transfer to Toilet: with supervision;Regular height toilet;Grab bars;Other (comment);Ambulation (versus 3in1) ADL Goal: Toilet Transfer - Progress: Goal set today Pt Will Perform Toileting - Clothing Manipulation: with supervision;Standing ADL Goal: Toileting - Clothing Manipulation - Progress: Goal set today Pt Will Perform Toileting - Hygiene: with supervision;Sit to stand from 3-in-1/toilet ADL Goal: Toileting - Hygiene - Progress: Goal set today Pt Will Perform Tub/Shower Transfer: with supervision;with DME;Shower seat with back ADL Goal: Web designer -  Progress: Goal set today  OT  Evaluation Precautions/Restrictions  Restrictions Weight Bearing Restrictions: No Prior Functioning Home Living Lives With: Alone Receives Help From: Other (Comment) (sister lives on the same hall) Type of Home: Independent living facility Bathroom Shower/Tub: Tub/shower unit;Other (comment) (grab bars inside) Bathroom Toilet: Standard (grab bars around toilet) Home Adaptive Equipment: Walker - four wheeled;Grab bars around toilet;Grab bars in shower;Shower chair with back Prior Function Level of Independence: Independent with basic ADLs;Requires assistive device for independence;Independent with homemaking with wheelchair;Other (comment) (pt does own laundry. Goes to dining room for 1 meal) ADL ADL Eating/Feeding: NPO Grooming: Performed;Wash/dry face;Set up Where Assessed - Grooming: Sitting, bed;Unsupported Upper Body Bathing: Performed;Chest;Right arm;Left arm;Abdomen;Set up Where Assessed - Upper Body Bathing: Sitting, bed;Unsupported Lower Body Bathing: Performed;Minimal assistance Where Assessed - Lower Body Bathing: Sit to stand from bed Upper Body Dressing: Simulated;Set up Where Assessed - Upper Body Dressing: Sitting, bed;Unsupported Lower Body Dressing: Simulated;Minimal assistance Where Assessed - Lower Body Dressing: Sit to stand from bed Toilet Transfer: Simulated;Minimal assistance Toilet Transfer Details (indicate cue type and reason): side step up toward [facility] to reposition before lying down without device Toileting - Clothing Manipulation: Simulated;Minimal assistance Where Assessed - Toileting Clothing Manipulation: Standing Toileting - Hygiene: Simulated;Minimal assistance Where Assessed - Toileting Hygiene: Standing Tub/Shower Transfer: Not assessed Tub/Shower Transfer Method: Not assessed Vision/Perception    Cognition Cognition Arousal/Alertness: Awake/alert Overall Cognitive Status: Appears within functional limits for tasks assessed Orientation Level:  Oriented X4 Sensation/Coordination Sensation Light Touch: Appears Intact Extremity Assessment RUE Assessment RUE Assessment: Within Functional Limits LUE Assessment LUE Assessment: Within Functional Limits Mobility  Bed Mobility Bed Mobility: Yes Supine to Sit: 5: Supervision;With rails;HOB elevated (Comment degrees) Transfers Sit to Stand: 4: Min assist;From bed;With upper extremity assist Sit to Stand Details (indicate cue type and reason): min guard assist Stand to Sit: 4: Min assist;To bed;With upper extremity assist Stand to Sit Details: min guard assist Exercises   End of Session OT - End of Session Activity Tolerance: Patient tolerated treatment well Patient left: in bed;with call bell in reach;with family/visitor present General Behavior During Session: [facility] for tasks performed Cognition: [facility] for tasks performed   Lennox Laity [phone] 12/12/2011, 11:33 AM

## 2011-12-12 NOTE — Transfer of Care (Signed)
Immediate Anesthesia Transfer of Care Note  Patient: Tammy Myers  Procedure(s) Performed: Procedure(s) (LRB): LAPAROSCOPIC CHOLECYSTECTOMY SINGLE PORT (N/A)  Patient Location: PACU  Anesthesia Type: General  Level of Consciousness: sedated  Airway & Oxygen Therapy: Patient Spontanous Breathing and Patient connected to face mask oxygen  Post-op Assessment: Report given to PACU RN and Post -op Vital signs reviewed and stable  Post vital signs: Reviewed and stable  Complications: No apparent anesthesia complications

## 2011-12-12 NOTE — Progress Notes (Signed)
Subjective: Afebrile, VSS, WBC 4.8, LFT's below, Platlets are low today 82K/ 178K on 12/10/11. Says her stomach still hurts. Objective: Vital signs in last 24 hours: Temp:  [97.4 F (36.3 C)-99 F (37.2 C)] 97.4 F (36.3 C) (03/05 0650) Pulse Rate:  [85-90] 85  (03/05 0650) Resp:  [16-18] 18  (03/05 0650) BP: (144-147)/(54-74) 144/55 mmHg (03/05 0650) SpO2:  [91 %-96 %] 96 % (03/05 0650) Last BM Date: 12/09/11  Intake/Output from previous day: 03/04 0701 - 03/05 0700 In: 941.3 [I.V.:941.3] Out: 1025 [Urine:1025] Intake/Output this shift:   PE:  Alert, c/'o pain in her stomach.  Abd. Tender to palpation Right side.  +BS, not distended.  Lab Results:   Basename 12/12/11 0350 12/10/11 0350  WBC 4.8 5.9  HGB 9.5* 9.3*  HCT 30.9* 30.8*  PLT 82* 178     BMET  Basename 12/12/11 0350 12/11/11 0316  NA 134* 134*  K 3.9 4.2  CL 102 104  CO2 22 23  GLUCOSE 104* 84  BUN 11 10  CREATININE 0.55 0.57  CALCIUM 8.4 8.7   PT/INR  Basename 12/10/11 1500  LABPROT 14.8  INR 1.14    Lab 12/12/11 0350 12/11/11 0316 12/10/11 0350 12/09/11 0530 12/08/11 2102  AST 72* 66* 62* 54* 58*  ALT 43* 39* 38* 34 37*  ALKPHOS 583* 574* 588* 553* 598*  BILITOT 1.3* 1.3* 1.7* 1.4* 1.2  PROT 5.4* 5.4* 5.8* 5.8* 6.2  ALBUMIN 2.6* 2.6* 2.8* 2.8* 3.1*     Studies/Results: Nm Hepatobiliary  12/11/2011  *RADIOLOGY REPORT*  Clinical Data:  Right upper quadrant abdominal pain.  NUCLEAR MEDICINE HEPATOBILIARY IMAGING  Technique:  Sequential images of the abdomen were obtained out to 60 minutes following intravenous administration of radiopharmaceutical.  Radiopharmaceutical:  5.35mCi Tc-53m Choletec  Comparison:  Ultrasound abdomen 12/08/2011.  Findings: There is symmetric uptake in the liver and prompt excretion into the biliary tree which is visualized at 15 minutes. Activity is seen in the small bowel at 30 minutes.  The gallbladder was never identified.  The patient was given morphine at 1 hour  with additional imaging performed but no filling of the gallbladder.  Findings consistent with cystic duct obstruction.  IMPRESSION:  1.  Non-visualization of the gallbladder consistent with cystic duct obstruction. 2.  Patent common bile duct.  Original Report Authenticated By: P. Loralie Champagne, M.D.    Anti-infectives: Anti-infectives     Start     Dose/Rate Route Frequency Ordered Stop   12/12/11 0800   ceFAZolin (ANCEF) IVPB 1 g/50 mL premix        1 g 100 mL/hr over 30 Minutes Intravenous 60 min pre-op 12/11/11 1715     12/11/11 1636   ceFAZolin (ANCEF) IVPB 1 g/50 mL premix  Status:  Discontinued        1 g 100 mL/hr over 30 Minutes Intravenous 60 min pre-op 12/11/11 1621 12/11/11 1715   12/11/11 1600   ciprofloxacin (CIPRO) IVPB 400 mg        400 mg 200 mL/hr over 60 Minutes Intravenous Every 12 hours 12/11/11 1501           Current Facility-Administered Medications  Medication Dose Route Frequency Provider Last Rate Last Dose  . bisacodyl (DULCOLAX) suppository 10 mg  10 mg Rectal Daily PRN Osvaldo Shipper, MD      . ceFAZolin (ANCEF) IVPB 1 g/50 mL premix  1 g Intravenous 60 min Pre-Op Sherrie George, PA      . ciprofloxacin (CIPRO) IVPB  400 mg  400 mg Intravenous Q12H Osvaldo Shipper, MD   400 mg at 12/12/11 0417  . dextrose 5 %-0.45 % sodium chloride infusion   Intravenous Continuous Osvaldo Shipper, MD 75 mL/hr at 12/11/11 1727    . docusate sodium (COLACE) capsule 100 mg  100 mg Oral BID Gery Pray, MD   100 mg at 12/11/11 2144  . donepezil (ARICEPT) tablet 5 mg  5 mg Oral QHS Debby Crosley, MD   5 mg at 12/11/11 2144  . fentaNYL (DURAGESIC - dosed mcg/hr) patch 25 mcg  25 mcg Transdermal Q72H Debby Crosley, MD   25 mcg at 12/11/11 1044  . ferrous sulfate tablet 325 mg  325 mg Oral QPC breakfast Debby Crosley, MD   325 mg at 12/11/11 1044  . HYDROcodone-acetaminophen (NORCO) 5-325 MG per tablet 1 tablet  1 tablet Oral Q4H PRN Gery Pray, MD   1 tablet at 12/11/11  2144  . hydrocortisone cream 1 %   Topical BID Osvaldo Shipper, MD      . morphine 2 MG/ML injection 1 mg  1 mg Intravenous Q4H PRN Debby Crosley, MD   1 mg at 12/09/11 0355  . morphine 2 MG/ML injection 2 mg  2 mg Intravenous Once Trudie Reed, MD   2 mg at 12/11/11 1048  . pantoprazole (PROTONIX) EC tablet 40 mg  40 mg Oral BID AC Debby Crosley, MD   40 mg at 12/11/11 1724  . polyethylene glycol (MIRALAX / GLYCOLAX) packet 17 g  17 g Oral BID Osvaldo Shipper, MD   17 g at 12/10/11 2158  . predniSONE (DELTASONE) tablet 5 mg  5 mg Oral Q breakfast Debby Crosley, MD   5 mg at 12/11/11 1044  . technetium TC 73M mebrofenin (CHOLETEC) injection 5 milli Curie  5 milli Curie Intravenous Once PRN Medication Radiologist, MD   5 milli Curie at 12/11/11 0800  . DISCONTD: ceFAZolin (ANCEF) IVPB 1 g/50 mL premix  1 g Intravenous 60 min Pre-Op Sherrie George, PA        Assessment/Plan 1.Cholelithiasis, elevated LFT's, ongoing abdominal pain/constipation; s/p appendectomy, Ventral hernia repair RUQ,/LAP, sigmoid colectomy and biologic mesh rectopexy 11/26/10.  2.Esophagitis by EGD 11/02/11  3.GERD  4.Polymyalgia Rheumatica  5. Atrial Fib, with prior Coumadin use; stopped Jan. 2013.  6. Polymyalgia Rheumatica/hx temporal arteritis  7.Alzheimers Dementia  8.Mitral Valve prolapse  9.Spinal Stenosis/Scitica  10. Dyslipidemia 11.  Thrombocytopenia.   Plan:  Dr. Rito Ehrlich is going to repeat CBC.  DR. Michaell Cowing will see her and discuss surgery for later today.   LOS: 4 days    JENNINGS,WILLARD 12/12/2011  Seen w pt & her sister.  Pain mainly RUQ.  Still with nausea.  I think she needs lap chole.  Platelet count on repeat OK  The anatomy & physiology of hepatobiliary & pancreatic function was discussed.  The pathophysiology of gallbladder dysfunction was discussed.  Natural history risks without surgery was discussed.   I feel the risks of no intervention will lead to serious problems that outweigh the  operative risks; therefore, I recommended cholecystectomy to remove the pathology.  I explained laparoscopic techniques with possible need for an open approach.  Probable cholangiogram to evaluate the bilary tract was explained as well.    Risks such as bleeding, infection, abscess, leak, injury to other organs, need for further treatment, heart attack, death, and other risks were discussed.  I noted a good likelihood this will help address the problem.  Possibility that this will not  correct all abdominal symptoms was explained.  Goals of post-operative recovery were discussed as well.  We will work to minimize complications.  An educational handout further explaining the pathology and treatment options was given as well.  Questions were answered.  The patient expresses understanding & wishes to proceed with surgery.

## 2011-12-12 NOTE — Brief Op Note (Signed)
12/08/2011 - 12/12/2011  2:30 PM  PATIENT:  Tammy Myers  78 y.o. female  Patient Care Team: [name], MD as PCP - General (Internal Medicine) Theda Belfast, MD as Consulting Physician (Gastroenterology) Shawnie Pons, MD as Consulting Physician (Cardiology) Robley Fries, MD as Attending Physician (Obstetrics and Gynecology)  PRE-OPERATIVE DIAGNOSIS:  gallstones  POST-OPERATIVE DIAGNOSIS:  Chronic cholecystitis with gallstones  PROCEDURE:  Procedure(s) (LRB): LAPAROSCOPIC CHOLECYSTECTOMY with IOC, SINGLE SITE (N/A)  SURGEON:  Surgeon(s) and Role:    * Ardeth Sportsman, MD - Primary  PHYSICIAN ASSISTANT:   ASSISTANTS: none   ANESTHESIA:   local and general  EBL:  Total I/O In: 1000 [I.V.:1000] Out: 200 [Urine:200]  BLOOD ADMINISTERED:none  DRAINS: none   LOCAL MEDICATIONS USED:  BUPIVICAINE  and Amount: 45 ml  SPECIMEN:  Source of Specimen:  Gallbladder  DISPOSITION OF SPECIMEN:  PATHOLOGY  COUNTS:  YES  TOURNIQUET:  * No tourniquets in log *  DICTATION: .Other Dictation: Dictation Number   The patient was identified & brought in the operating room. The patient was positioned supine with arms tucked. SCDs were active during the entire case. The patient underwent general anesthesia without any difficulty.  The abdomen was prepped and draped in a sterile fashion. A Surgical Timeout confirmed our plan.  I made a transverse curvilinear incision through the superior umbilical fold.  I placed a 5mm long port through the supraumbilical fascia using a modified Hassan cutdown technique. I began carbon dioxide insufflation. Camera inspection revealed no injury. There were no adhesions to the anterior abdominal wall supraumbilically.  I proceeded to continue with single site technique. I placed a #5 port in left upper aspect of the wound. I placed a 5 mm atraumatic grasper in the right inferior aspect of the wound.  I turned attention to the right upper quadrant.   She had a very large, dilated, boggy gallbladder. The gallbladder fundus was elevated cephalad. I freed the peritoneal coverings between the gallbladder and the liver on the posteriolateral and anteriomedial walls. I alternated between Harmonic & blunt Maryland dissection to help get a good critical view of the cystic artery and cystic duct. I did further dissection to free a few centimeters of the  gallbladder off the liver bed to get a good critical view of the infundibulum and cystic duct. I mobilized the cystic artery; and, after getting a good 360 view, ligated the cystic artery using the Harmonic ultrasonic dissection. I skeletonized the cystic duct.  I placed a clip on the infundibulum. I did a partial cystic duct-otomy and ensured patency. I placed a 5 [place] cholangiocatheter through a puncture site at the right subcostal ridge of the abdominal wall and directed it into the cystic duct.  We ran a cholangiogram with dilute radio-opaque contrast and continuous fluoroscopy. Contrast flowed from a side branch consistent with cystic duct cannulization. Contrast flowed flowed down a very dilated common bile duct easily across the normal ampulla into the duodenum.  I could only get a few wisps of contrast up to the RHD & LHD.  I again tried top get some contrast to reflux up the CHD but contrast preferred to dilate up the CBD & flow into the duodenum.  I removed the cholangiocatheter. I placed clips on the cystic duct x4.  I completed cystic duct transection.  I placed an endoloop 0-PDS around the cystic duct stump.  I freed the gallbladder from its remaining attachments to the liver. I ensured hemostasis on  the gallbladder fossa of the liver and elsewhere. I inspected the rest of the abdomen & detected no injury nor bleeding elsewhere.  I removed the gallbladder out the supraumbilical fascia. I closed the fascia transversely using 0 Vicryl interrupted stitches. A closed the skin using 4-0 monocryl  stitch.  Sterile dressing was applied. The patient was extubated & arrived in the PACU in stable condition..  I had discussed postoperative care with the patient in the holding area. I am about to locate the patient's family and discuss operative findings and postoperative goals / instructions.  Instructions are written in the chart as well.   PLAN OF CARE: Admit to inpatient   PATIENT DISPOSITION:  PACU - hemodynamically stable.   Delay start of Pharmacological VTE agent (>24hrs) due to surgical blood loss or risk of bleeding: no

## 2011-12-12 NOTE — Anesthesia Postprocedure Evaluation (Signed)
  Anesthesia Post-op Note  Patient: Tammy Myers  Procedure(s) Performed: Procedure(s) (LRB): LAPAROSCOPIC CHOLECYSTECTOMY SINGLE PORT (N/A)  Patient Location: PACU  Anesthesia Type: General  Level of Consciousness: awake and alert   Airway and Oxygen Therapy: Patient Spontanous Breathing  Post-op Pain: mild  Post-op Assessment: Post-op Vital signs reviewed, Patient's Cardiovascular Status Stable, Respiratory Function Stable, Patent Airway and No signs of Nausea or vomiting  Post-op Vital Signs: stable  Complications: No apparent anesthesia complications

## 2011-12-12 NOTE — Telephone Encounter (Signed)
Maura from [facility] called to let Dr. Michaell Cowing know that the platelet results were available and the patient has been cleared for surgery by the Hospitalist.

## 2011-12-12 NOTE — Progress Notes (Signed)
Patient ID: Tammy Myers, female   DOB: Feb 17, 1921, 78 y.o.   MRN: [id number]   PATIENT SEEN PRE-OP. STILL TENDER ON EXAM. POST-OP FINDINGS REVIEWED. THIS WAS THE CAUSE OF HER PAIN AND SURGERY SHOULD BE CURATIVE. APPRECIATE DR GROSS' HELP. WE ARE AVAILABLE PRN. THANKS.  Wilhemina Bonito. Eda Keys., M.D. [facility] Division of Gastroenterology

## 2011-12-12 NOTE — Discharge Instructions (Signed)

## 2011-12-12 NOTE — Anesthesia Preprocedure Evaluation (Signed)
Anesthesia Evaluation    Airway       Dental   Pulmonary          Cardiovascular hypertension, Pt. on medications + dysrhythmias Atrial Fibrillation + Valvular Problems/Murmurs MVP     Neuro/Psych Spinal stenosis.  alzheimers dementia    GI/Hepatic PUD, GERD-  Medicated and Controlled,  Endo/Other    Renal/GU      Musculoskeletal   Abdominal   Peds  Hematology   Anesthesia Other Findings   Reproductive/Obstetrics                           Anesthesia Physical Anesthesia Plan  ASA: III  Anesthesia Plan: General   Post-op Pain Management:    Induction: Intravenous  Airway Management Planned: Oral ETT  Additional Equipment:   Intra-op Plan:   Post-operative Plan: Extubation in OR  Informed Consent:   Plan Discussed with: Surgeon  Anesthesia Plan Comments:         Anesthesia Quick Evaluation

## 2011-12-12 NOTE — Progress Notes (Signed)
UR completed 

## 2011-12-12 NOTE — Anesthesia Procedure Notes (Signed)
Procedure Name: Intubation Date/Time: 12/12/2011 1:10 PM Performed by: Joycie Peek Pre-anesthesia Checklist: Patient identified, Timeout performed, Emergency Drugs available, Suction available and Patient being monitored Patient Re-evaluated:Patient Re-evaluated prior to inductionOxygen Delivery Method: Circle system utilized Preoxygenation: Pre-oxygenation with 100% oxygen Intubation Type: IV induction Laryngoscope Size: Mac and 4 Grade View: Grade I Tube type: Oral Tube size: 7.5 mm Number of attempts: 1 Airway Equipment and Method: Stylet Placement Confirmation: ETT inserted through vocal cords under direct vision,  breath sounds checked- equal and bilateral and positive ETCO2 Secured at: 21 cm Tube secured with: Tape Dental Injury: Teeth and Oropharynx as per pre-operative assessment

## 2011-12-12 NOTE — Progress Notes (Addendum)
PCP: [name], MD, MD  Brief HPI:  This is a 78 year old female resident of an independent living center. She came in with complaints of abdominal pain. She states her pain is generalized. She states has been ongoing for approximately 4 months however is gone worse recently. She denies any nausea vomiting or diarrhea, no fever, chills. Patient is a poor historian, she has dementia but she was clear with the fact that her abdomen is hurting.   Past medical history:  Past Medical History   Diagnosis  Date   .  PMR (polymyalgia rheumatica)    .  Temporal arteritis    .  Osteoporosis    .  Hearing loss    .  Wears glasses    .  Rectal bleeding    .  Arthritis    .  Rectal prolapse    .  Hypertension    .  Mitral valve prolapse    .  Spinal stenosis    .  Alzheimer's dementia    .  Hyperlipidemia    .  GERD (gastroesophageal reflux disease)    .  Sciatica    .  Atrial fibrillation      Consultants: [facility] (Dr. Leone Payor)  Procedures: None  Subjective: Patient rested but she continues to have pain. No nausea or vomiting.   Objective: Vital signs in last 24 hours: Temp:  [97.4 F (36.3 C)-99 F (37.2 C)] 97.4 F (36.3 C) (03/05 0650) Pulse Rate:  [85-90] 85  (03/05 0650) Resp:  [16-18] 18  (03/05 0650) BP: (144-147)/(54-74) 144/55 mmHg (03/05 0650) SpO2:  [91 %-96 %] 96 % (03/05 0650) Weight change:  Last BM Date: 12/09/11  Intake/Output from previous day: 03/04 0701 - 03/05 0700 In: 941.3 [I.V.:941.3] Out: 1025 [Urine:1025] Intake/Output this shift:    General appearance: alert, cooperative, distracted and no distress Head: Normocephalic, without obvious abnormality, atraumatic Resp: clear to auscultation bilaterally Cardio: regular rate and rhythm, S1, S2 normal, no murmur, click, rub or gallop GI: Soft, Less Tender today. Able to localize to epigastrium and RUQ. Murphy's sign was negative. No masses or organomegaly.  Neurologic: Grossly normal. Confused,  but at baseline  Lab Results:  Basename 12/12/11 0350 12/10/11 0350  WBC 4.8 5.9  HGB 9.5* 9.3*  HCT 30.9* 30.8*  PLT 82* 178   BMET  Basename 12/12/11 0350 12/11/11 0316  NA 134* 134*  K 3.9 4.2  CL 102 104  CO2 22 23  GLUCOSE 104* 84  BUN 11 10  CREATININE 0.55 0.57  CALCIUM 8.4 8.7  ALT 43* 39*   Alk P: 583 T Bil: 1.3   Studies/Results: Nm Hepatobiliary  12/11/2011  *RADIOLOGY REPORT*  Clinical Data:  Right upper quadrant abdominal pain.  NUCLEAR MEDICINE HEPATOBILIARY IMAGING  Technique:  Sequential images of the abdomen were obtained out to 60 minutes following intravenous administration of radiopharmaceutical.  Radiopharmaceutical:  5.30mCi Tc-79m Choletec  Comparison:  Ultrasound abdomen 12/08/2011.  Findings: There is symmetric uptake in the liver and prompt excretion into the biliary tree which is visualized at 15 minutes. Activity is seen in the small bowel at 30 minutes.  The gallbladder was never identified.  The patient was given morphine at 1 hour with additional imaging performed but no filling of the gallbladder.  Findings consistent with cystic duct obstruction.  IMPRESSION:  1.  Non-visualization of the gallbladder consistent with cystic duct obstruction. 2.  Patent common bile duct.  Original Report Authenticated By: P. MARK  Pecolia Ades, M.D.    Medications:  Scheduled:    .  ceFAZolin (ANCEF) IV  1 g Intravenous 60 min Pre-Op  . ciprofloxacin  400 mg Intravenous Q12H  . docusate sodium  100 mg Oral BID  . donepezil  5 mg Oral QHS  . fentaNYL  25 mcg Transdermal Q72H  . ferrous sulfate  325 mg Oral QPC breakfast  . hydrocortisone cream   Topical BID  .  morphine injection  2 mg Intravenous Once  . pantoprazole  40 mg Oral BID AC  . polyethylene glycol  17 g Oral BID  . predniSONE  5 mg Oral Q breakfast  . DISCONTD:  ceFAZolin (ANCEF) IV  1 g Intravenous 60 min Pre-Op    Assessment/Plan:  Active Problems:  HYPERTENSION  ATRIAL FIBRILLATION,  PAROXYSMAL  MURMUR  Constipation, chronic  Hemorrhoids, internal, with prolapse & bleeding  Anemia  Esophagitis, erosive  Abdominal pain  elevated alkaline phosphatase  Nonspecific elevation of levels of transaminase or lactic acid dehydrogenase (LDH)  Thickened gallbladder wall and chronically dilated bile duct  RUQ abdominal tenderness  Ascites    1. Abdominal pain due ?Cholecystitis: Initially thought to be due to constipation. But her LFT's were trending upwards. GI was consulted and HIDA was done 3/4. Results as above. Surgery called and they are evaluating for possible cholecystectomy. Cipro initiated.   2. Thrombocytopenia: Sudden drop in platelet count. Not on heparin products. Repeat count to rule out error. May need platelet transfusion prior to surgery. Surgery PA made aware.  3. Chronic constipation: Has had multiple BM's with bowel regimen.  4. Recent diagnosis of severe ulcerative esophagitis: Continue PPI BID.  5. Atrial fibrillation: Taken off of coumadin in January with plans to reinstitute in 2 weeks. Hasn't been done so far. Unclear why. Will let PCP address this. Should be on aspirin in the interim. Will hold for now due to possibility of procedure. Check EKG.  6. Anemia: Stable. Monitor. Anemia panel pending.   7. Hypertension: Stable  8. Polymyalgia rheumatica and Temporal arteritis: Patient on chronic steroids which will be continued. Stress dose steroids peri-operatively, with quick taper if stable.   9. Full Code  9. DVT prophylaxis: SCD's   Patient is stable.    LOS: 4 days   [facility] Pager [phone] 12/12/2011, 7:59 AM   Repeat CBC showed Platelet count to be normal. Am lab was likely erroneous. Ok for surgery.  Hazleigh Mccleave 10:48 AM

## 2011-12-13 LAB — COMPREHENSIVE METABOLIC PANEL
ALT: 42 U/L — ABNORMAL HIGH (ref 0–35)
Alkaline Phosphatase: 589 U/L — ABNORMAL HIGH (ref 39–117)
Chloride: 101 mEq/L (ref 96–112)
GFR calc Af Amer: >90 mL/min (ref >90)
Glucose, Bld: 159 mg/dL — ABNORMAL HIGH (ref 70–99)
Potassium: 4 mEq/L (ref 3.5–5.1)
Sodium: 131 mEq/L — ABNORMAL LOW (ref 135–145)
Total Protein: 5.5 g/dL — ABNORMAL LOW (ref 6.0–8.3)

## 2011-12-13 LAB — CBC
Hemoglobin: 10 g/dL — ABNORMAL LOW (ref 12.0–15.0)
MCHC: 30.9 g/dL (ref 30.0–36.0)
RBC: 3.76 MIL/uL — ABNORMAL LOW (ref 3.87–5.11)
WBC: 10.5 10*3/uL (ref 4.0–10.5)

## 2011-12-13 MED ORDER — FENTANYL BOLUS VIA INFUSION: 25.0000 ug | Freq: Once | INTRAVENOUS | Status: DC

## 2011-12-13 MED ORDER — PREDNISONE 5 MG PO TABS
5.0000 mg | ORAL_TABLET | Freq: Every day | ORAL | Status: DC
  Administered 2011-12-14 – 2011-12-15 (×2): 5 mg via ORAL
  Filled 2011-12-13 (×2): qty 1

## 2011-12-13 MED ORDER — FENTANYL CITRATE 0.05 MG/ML IJ SOLN
INTRAMUSCULAR | Status: AC
  Administered 2011-12-13: 25 ug via INTRAVENOUS
  Filled 2011-12-13: qty 2

## 2011-12-13 MED ORDER — SODIUM CHLORIDE 0.9 % IV SOLN
INTRAVENOUS | Status: DC
Start: 2011-12-13 — End: 2011-12-14
  Administered 2011-12-13: 18:00:00 via INTRAVENOUS

## 2011-12-13 MED ORDER — FENTANYL CITRATE 0.05 MG/ML IJ SOLN: 25.0000 ug | Freq: Once | INTRAMUSCULAR | Status: DC

## 2011-12-13 MED ORDER — FENTANYL BOLUS VIA INFUSION: 20.0000 ug | INTRAVENOUS | Status: DC | PRN

## 2011-12-13 MED ORDER — FENTANYL CITRATE 0.05 MG/ML IJ SOLN: 20.0000 ug | INTRAMUSCULAR | Status: DC | PRN

## 2011-12-13 MED ORDER — TRAMADOL HCL 50 MG PO TABS
50.0000 mg | ORAL_TABLET | Freq: Four times a day (QID) | ORAL | Status: DC | PRN
  Administered 2011-12-14: 100 mg via ORAL
  Filled 2011-12-13: qty 2

## 2011-12-13 NOTE — Progress Notes (Signed)
Subjective: Pt currently feels better but is sore due to recent operation.  Denies any nausea or emesis. Objective: Filed Vitals:   12/12/11 1600 12/12/11 2119 12/13/11 0601 12/13/11 1330  BP: 188/75 153/66 146/67 111/65  Pulse: 89 81 86 88  Temp: 98.2 F (36.8 C) 98.7 F (37.1 C) 98.3 F (36.8 C) 98.2 F (36.8 C)  TempSrc:  Oral Oral Oral  Resp:  22 20 18   Height:      Weight:      SpO2: 94% 95% 95% 93%   Weight change:   Intake/Output Summary (Last 24 hours) at 12/13/11 1800 Last data filed at 12/13/11 1700  Gross per 24 hour  Intake    340 ml  Output   1100 ml  Net   -760 ml   General appearance: alert, cooperative, distracted and no distress  Head: Normocephalic, without obvious abnormality, atraumatic  Resp: clear to auscultation bilaterally  Cardio: regular rate and rhythm, S1, S2 normal, no murmur, click, rub or gallop  GI: gauze in place over umbilicus.  No active bleeding or cellulitis observed. Neurologic: Grossly normal. Confused, but at baseline  Lab Results:  [facility] 12/13/11 0404 12/12/11 0350  NA 131* 134*  K 4.0 3.9  CL 101 102  CO2 24 22  GLUCOSE 159* 104*  BUN 11 11  CREATININE 0.50 0.55  CALCIUM 8.3* 8.4  MG -- --  PHOS -- --    Basename 12/13/11 0404 12/12/11 0350  AST 69* 72*  ALT 42* 43*  ALKPHOS 589* 583*  BILITOT 1.3* 1.3*  PROT 5.5* 5.4*  ALBUMIN 2.6* 2.6*    Basename 12/11/11 0316  LIPASE 29  AMYLASE --    Basename 12/13/11 0404 12/12/11 0925  WBC 10.5 5.5  NEUTROABS -- --  HGB 10.0* 10.1*  HCT 32.4* 33.0*  MCV 86.2 86.4  PLT 120* 153   No results found for this basename: CKTOTAL:3,CKMB:3,CKMBINDEX:3,TROPONINI:3 in the last 72 hours No components found with this basename: POCBNP:3 No results found for this basename: DDIMER:2 in the last 72 hours No results found for this basename: HGBA1C:2 in the last 72 hours No results found for this basename: CHOL:2,HDL:2,LDLCALC:2,TRIG:2,CHOLHDL:2,LDLDIRECT:2 in the last 72  hours No results found for this basename: TSH,T4TOTAL,FREET3,T3FREE,THYROIDAB in the last 72 hours No results found for this basename: VITAMINB12:2,FOLATE:2,FERRITIN:2,TIBC:2,IRON:2,RETICCTPCT:2 in the last 72 hours  Micro Results: No results found for this or any previous visit (from the past 240 hour(s)).  Studies/Results: Dg Cholangiogram Operative  12/12/2011  *RADIOLOGY REPORT*  Clinical Data:   78 year old female with right upper quadrant pain.  INTRAOPERATIVE CHOLANGIOGRAM  Technique:  Cholangiographic images from the C-arm fluoroscopic device were submitted for interpretation post-operatively.  Please see the procedural report for the amount of contrast and the fluoroscopy time utilized.  Comparison:  Abdominal ultrasound 12/08/2011.  CT abdomen 11/27/2011.  Fluoroscopy time of 0.7 minutes was utilized.  Findings:  Intraoperative fluoroscopic images of the right upper quadrant.  Surgical clips at the level of the cystic duct which is cannulated.  Contrast is injected demonstrating a dilated, patulous CBD.  Distal circumferential narrowing of the CBD occurs but there is relatively prompt transit of contrast to the duodenum.  No CBD filling defect is identified.  No extravasation.  IMPRESSION: Patulous, dilated CBD with the distal beaked appearance which does allow contrast emptying into the duodenum.  Query chronic distal stricture.  No CBD filling defect identified.  Original Report Authenticated By: Harley Hallmark, M.D.    Medications: I have reviewed the  patient's current medications.  1. Cholecystitis: Pt is s/p cholecystectomy pod # 1.  Surgery is on board and following.   2. Thrombocytopenia: Sudden drop in platelet count. Not on heparin products. Repeat count to rule out error. May need platelet transfusion prior to surgery. Surgery PA made aware.  3. Chronic constipation: Has had multiple BM's with bowel regimen.  4. Recent diagnosis of severe ulcerative esophagitis: Continue PPI BID.    5. Atrial fibrillation: Taken off of coumadin in January with plans to reinstitute in 2 weeks. Hasn't been done so far. Unclear why. Will let PCP address this. Should be on aspirin in the interim. Will hold for now due to possibility of procedure. Check EKG.  6. Anemia: Stable. Monitor. Anemia panel pending.   7. Hypertension: Stable  8. Polymyalgia rheumatica and Temporal arteritis: Patient on chronic steroids which will be continued. Stress dose steroids peri-operatively, will plan on placing on home regimen tomorrow. 9. Full Code  9. DVT prophylaxis: SCD's  10. Hyponatremia: Likely due to poor oral intake.  Will plan on gently hydrating today.      LOS: 5 days   Penny Pia M.D.  [facility] 12/13/2011, 6:00 PM

## 2011-12-13 NOTE — Progress Notes (Signed)
Consider checking orthostatics Adv diet as tolerated Fragile patient but should rally  If LFTs worsen, consider outpt GI eval for dilated CBD - ?ERCP vs MRCP.  No obstruction noted though (prob c/w 78 y/o CBD & not great risk w ERCP anyway)

## 2011-12-13 NOTE — Progress Notes (Signed)
1 Day Post-Op  Subjective: Afebrile, BP up last night , better this AM  Objective: Vital signs in last 24 hours: Temp:  [97.6 F (36.4 C)-98.7 F (37.1 C)] 98.3 F (36.8 C) (03/06 0601) Pulse Rate:  [81-105] 86  (03/06 0601) Resp:  [20-27] 20  (03/06 0601) BP: (146-209)/(66-111) 146/67 mmHg (03/06 0601) SpO2:  [94 %-97 %] 95 % (03/06 0601) Last BM Date: 12/09/11  Intake/Output from previous day: 03/05 0701 - 03/06 0700 In: 1500 [P.O.:100; I.V.:1400] Out: 610 [Urine:600; Blood:10] Intake/Output this shift:    PE:  Alert, c/o being sore.  Abd:  Sl distended, soft, +BS, no flatus.  Incision sites clean and dry.  Lab Results:   Basename 12/13/11 0404 12/12/11 0925  WBC 10.5 5.5  HGB 10.0* 10.1*  HCT 32.4* 33.0*  PLT 120* 153    Lab 12/13/11 0404 12/12/11 0350 12/11/11 0316 12/10/11 0350 12/09/11 0530  AST 69* 72* 66* 62* 54*  ALT 42* 43* 39* 38* 34  ALKPHOS 589* 583* 574* 588* 553*  BILITOT 1.3* 1.3* 1.3* 1.7* 1.4*  PROT 5.5* 5.4* 5.4* 5.8* 5.8*  ALBUMIN 2.6* 2.6* 2.6* 2.8* 2.8*    BMET  Basename 12/13/11 0404 12/12/11 0350  NA 131* 134*  K 4.0 3.9  CL 101 102  CO2 24 22  GLUCOSE 159* 104*  BUN 11 11  CREATININE 0.50 0.55  CALCIUM 8.3* 8.4   PT/INR  Basename 12/10/11 1500  LABPROT 14.8  INR 1.14     Studies/Results: Dg Cholangiogram Operative  12/12/2011  *RADIOLOGY REPORT*  Clinical Data:   78 year old female with right upper quadrant pain.  INTRAOPERATIVE CHOLANGIOGRAM  Technique:  Cholangiographic images from the C-arm fluoroscopic device were submitted for interpretation post-operatively.  Please see the procedural report for the amount of contrast and the fluoroscopy time utilized.  Comparison:  Abdominal ultrasound 12/08/2011.  CT abdomen 11/27/2011.  Fluoroscopy time of 0.7 minutes was utilized.  Findings:  Intraoperative fluoroscopic images of the right upper quadrant.  Surgical clips at the level of the cystic duct which is cannulated.  Contrast is  injected demonstrating a dilated, patulous CBD.  Distal circumferential narrowing of the CBD occurs but there is relatively prompt transit of contrast to the duodenum.  No CBD filling defect is identified.  No extravasation.  IMPRESSION: Patulous, dilated CBD with the distal beaked appearance which does allow contrast emptying into the duodenum.  Query chronic distal stricture.  No CBD filling defect identified.  Original Report Authenticated By: Harley Hallmark, M.D.   Nm Hepatobiliary  12/11/2011  *RADIOLOGY REPORT*  Clinical Data:  Right upper quadrant abdominal pain.  NUCLEAR MEDICINE HEPATOBILIARY IMAGING  Technique:  Sequential images of the abdomen were obtained out to 60 minutes following intravenous administration of radiopharmaceutical.  Radiopharmaceutical:  5.60mCi Tc-30m Choletec  Comparison:  Ultrasound abdomen 12/08/2011.  Findings: There is symmetric uptake in the liver and prompt excretion into the biliary tree which is visualized at 15 minutes. Activity is seen in the small bowel at 30 minutes.  The gallbladder was never identified.  The patient was given morphine at 1 hour with additional imaging performed but no filling of the gallbladder.  Findings consistent with cystic duct obstruction.  IMPRESSION:  1.  Non-visualization of the gallbladder consistent with cystic duct obstruction. 2.  Patent common bile duct.  Original Report Authenticated By: P. Loralie Champagne, M.D.    Anti-infectives: Anti-infectives     Start     Dose/Rate Route Frequency Ordered Stop   12/13/11  0100   ciprofloxacin (CIPRO) IVPB 400 mg        400 mg 200 mL/hr over 60 Minutes Intravenous Every 12 hours 12/12/11 2002 12/14/11 0059   12/12/11 0800   ceFAZolin (ANCEF) IVPB 1 g/50 mL premix        1 g 100 mL/hr over 30 Minutes Intravenous 60 min pre-op 12/11/11 1715 12/12/11 1311   12/11/11 1636   ceFAZolin (ANCEF) IVPB 1 g/50 mL premix  Status:  Discontinued        1 g 100 mL/hr over 30 Minutes Intravenous 60  min pre-op 12/11/11 1621 12/11/11 1715   12/11/11 1600   ciprofloxacin (CIPRO) IVPB 400 mg  Status:  Discontinued        400 mg 200 mL/hr over 60 Minutes Intravenous Every 12 hours 12/11/11 1501 12/12/11 2002         Current Facility-Administered Medications  Medication Dose Route Frequency Provider Last Rate Last Dose  . acetaminophen (TYLENOL) tablet 650 mg  650 mg Oral QID Ardeth Sportsman, MD      . bisacodyl (DULCOLAX) suppository 10 mg  10 mg Rectal Daily PRN Osvaldo Shipper, MD      . ceFAZolin (ANCEF) IVPB 1 g/50 mL premix  1 g Intravenous 60 min Pre-Op Sherrie George, PA   1 g at 12/12/11 1311  . ciprofloxacin (CIPRO) IVPB 400 mg  400 mg Intravenous Q12H Ardeth Sportsman, MD   400 mg at 12/13/11 0119  . dextrose 5 %-0.45 % sodium chloride infusion   Intravenous Continuous Osvaldo Shipper, MD 75 mL/hr at 12/12/11 0924    . docusate sodium (COLACE) capsule 100 mg  100 mg Oral BID Debby Crosley, MD   100 mg at 12/12/11 2322  . donepezil (ARICEPT) tablet 5 mg  5 mg Oral QHS Debby Crosley, MD   5 mg at 12/12/11 2323  . fentaNYL (DURAGESIC - dosed mcg/hr) patch 25 mcg  25 mcg Transdermal Q72H Debby Crosley, MD   25 mcg at 12/11/11 1044  . fentaNYL (SUBLIMAZE) 0.05 MG/ML injection           . ferrous sulfate tablet 325 mg  325 mg Oral QPC breakfast Debby Crosley, MD   325 mg at 12/11/11 1044  . HYDROcodone-acetaminophen (NORCO) 5-325 MG per tablet 1 tablet  1 tablet Oral Q4H PRN Gery Pray, MD   1 tablet at 12/12/11 2032  . hydrocortisone cream 1 %   Topical BID Osvaldo Shipper, MD      . hydrocortisone sodium succinate (SOLU-CORTEF) injection 50 mg  50 mg Intravenous Q8H Osvaldo Shipper, MD   50 mg at 12/13/11 0012  . lip balm (CARMEX) ointment 1 application  1 application Topical BID Ardeth Sportsman, MD   1 application at 12/12/11 2100  . magic mouthwash  15 mL Oral QID PRN Ardeth Sportsman, MD      . morphine 2 MG/ML injection 1 mg  1 mg Intravenous Q4H PRN Gery Pray, MD   1 mg at  12/12/11 1852  . pantoprazole (PROTONIX) EC tablet 40 mg  40 mg Oral BID AC Debby Crosley, MD   40 mg at 12/11/11 1724  . polyethylene glycol (MIRALAX / GLYCOLAX) packet 17 g  17 g Oral BID Osvaldo Shipper, MD   17 g at 12/12/11 2322  . predniSONE (DELTASONE) tablet 5 mg  5 mg Oral Q breakfast Debby Crosley, MD   5 mg at 12/11/11 1044  . DISCONTD: bupivacaine-EPINEPHrine (MARCAINE W/ EPI) 0.25 % (with  pres) injection    PRN Ardeth Sportsman, MD   50 mL at 12/12/11 1406  . DISCONTD: ciprofloxacin (CIPRO) IVPB 400 mg  400 mg Intravenous Q12H Osvaldo Shipper, MD   400 mg at 12/12/11 0417  . DISCONTD: fentaNYL (SUBLIMAZE) injection 25-50 mcg  25-50 mcg Intravenous Q5 min PRN Gaetano Hawthorne, MD   25 mcg at 12/12/11 1505  . DISCONTD: lactated ringers infusion   Intravenous Continuous Gaetano Hawthorne, MD      . DISCONTD: lactated ringers irrigation solution    PRN Ardeth Sportsman, MD   3,000 mL at 12/12/11 1250  . DISCONTD: Omnipaque 300 mg/mL (10 ml)in 0.9 % normal saline (10 mL)    PRN Ardeth Sportsman, MD       Facility-Administered Medications Ordered in Other Encounters  Medication Dose Route Frequency Provider Last Rate Last Dose  . DISCONTD: etomidate (AMIDATE) injection    PRN Joycie Peek, CRNA   10 mg at 12/12/11 1310  . DISCONTD: fentaNYL (SUBLIMAZE) injection    PRN Joycie Peek, CRNA   25 mcg at 12/12/11 1414  . DISCONTD: lactated ringers infusion    Continuous PRN Joycie Peek, CRNA      . DISCONTD: lidocaine (cardiac) 100 mg/18ml (XYLOCAINE) 20 MG/ML injection 2%    PRN Diana L. Reardon, CRNA   50 mg at 12/12/11 1310  . DISCONTD: ondansetron (ZOFRAN) injection    PRN Joycie Peek, CRNA   4 mg at 12/12/11 1310  . DISCONTD: propofol (DIPRIVAN) 10 mg/mL bolus    PRN Joycie Peek, CRNA   50 mg at 12/12/11 1310  . DISCONTD: rocuronium (ZEMURON) injection    PRN Joycie Peek, CRNA   5 mg at 12/12/11 1358  . DISCONTD: succinylcholine (ANECTINE) injection    PRN Diana L. Reardon, CRNA   60 mg at 12/12/11  1310    Assessment/Plan Chronic cholecystitis with gallstones/ s/p Lap cholecystectomy 12/12/11 1.Cholelithiasis, elevated LFT's, ongoing abdominal pain/constipation; s/p appendectomy, Ventral hernia repair RUQ,/LAP, sigmoid colectomy and biologic mesh rectopexy 11/26/10.  2.Esophagitis by EGD 11/02/11  3.GERD  4.Polymyalgia Rheumatica  5. Atrial Fib, with prior Coumadin use; stopped Jan. 2013.  6. Polymyalgia Rheumatica/hx temporal arteritis  7.Alzheimers Dementia  8.Mitral Valve prolapse  9.Spinal Stenosis/Scitica  10. Dyslipidemia  11. Thrombocytopenia.  Plan:  LFT's show nor major improvement.  She has only had hydrocodone last pm for pain.  I ask them to give her some fentanyl, get her more comfortable, go slow with PO's.  They said she was very unstable getting OOB last PM.  Get PT to see.  I doubt she will be ready to go home today. Na still low, but will defer IV's to Medicine.      LOS: 5 days    Zyion Doxtater 12/13/2011

## 2011-12-14 ENCOUNTER — Inpatient Hospital Stay (HOSPITAL_COMMUNITY): Payer: Medicare Other

## 2011-12-14 LAB — CBC
Hemoglobin: 9 g/dL — ABNORMAL LOW (ref 12.0–15.0)
MCH: 26.8 pg (ref 26.0–34.0)
MCHC: 30.8 g/dL (ref 30.0–36.0)
Platelets: 143 10*3/uL — ABNORMAL LOW (ref 150–400)

## 2011-12-14 LAB — COMPREHENSIVE METABOLIC PANEL
ALT: 40 U/L — ABNORMAL HIGH (ref 0–35)
Calcium: 8.4 mg/dL (ref 8.4–10.5)
Creatinine, Ser: 0.49 mg/dL — ABNORMAL LOW (ref 0.50–1.10)
GFR calc Af Amer: >90 mL/min (ref >90)
Glucose, Bld: 122 mg/dL — ABNORMAL HIGH (ref 70–99)
Sodium: 136 mEq/L (ref 135–145)
Total Protein: 5.2 g/dL — ABNORMAL LOW (ref 6.0–8.3)

## 2011-12-14 LAB — MITOCHONDRIAL ANTIBODIES: Mitochondrial M2 Ab, IgG: 0.25 (ref <0.91)

## 2011-12-14 MED ORDER — ONDANSETRON HCL 4 MG PO TABS: 4.0000 mg | ORAL_TABLET | Freq: Three times a day (TID) | ORAL | Status: DC | PRN

## 2011-12-14 NOTE — Progress Notes (Signed)
2 Days Post-Op  Subjective: Stomach hurts more, and is distended.  She said it hurt yesterday, but worse this AM Afebrile,  VSS, WBC is normal, LFT's starting to trend down. Objective: Vital signs in last 24 hours: Temp:  [97.9 F (36.6 C)-98.2 F (36.8 C)] 97.9 F (36.6 C) (03/07 0539) Pulse Rate:  [82-101] 101  (03/07 0542) Resp:  [16-18] 18  (03/07 0539) BP: (111-150)/(52-72) 115/58 mmHg (03/07 0542) SpO2:  [90 %-96 %] 96 % (03/07 0539) Last BM Date: 12/11/11  Intake/Output from previous day: 03/06 0701 - 03/07 0700 In: 240 [P.O.:240] Out: 1250 [Urine:1250] Intake/Output this shift:    PE: Alert, trying to take full liquids.  Abdomen distended, denies flatus or BM. Still tender, no rebound.  Lab Results:   Basename 12/14/11 0415 12/13/11 0404  WBC 7.9 10.5  HGB 9.0* 10.0*  HCT 29.2* 32.4*  PLT 143* 120*    Lab 12/14/11 0415 12/13/11 0404 12/12/11 0350 12/11/11 0316 12/10/11 0350  AST 55* 69* 72* 66* 62*  ALT 40* 42* 43* 39* 38*  ALKPHOS 556* 589* 583* 574* 588*  BILITOT 1.1 1.3* 1.3* 1.3* 1.7*  PROT 5.2* 5.5* 5.4* 5.4* 5.8*  ALBUMIN 2.5* 2.6* 2.6* 2.6* 2.8*    BMET  Basename 12/14/11 0415 12/13/11 0404  NA 136 131*  K 3.7 4.0  CL 105 101  CO2 23 24  GLUCOSE 122* 159*  BUN 11 11  CREATININE 0.49* 0.50  CALCIUM 8.4 8.3*   PT/INR No results found for this basename: LABPROT:2,INR:2 in the last 72 hours   Studies/Results: Dg Cholangiogram Operative  12/12/2011  *RADIOLOGY REPORT*  Clinical Data:   78 year old female with right upper quadrant pain.  INTRAOPERATIVE CHOLANGIOGRAM  Technique:  Cholangiographic images from the C-arm fluoroscopic device were submitted for interpretation post-operatively.  Please see the procedural report for the amount of contrast and the fluoroscopy time utilized.  Comparison:  Abdominal ultrasound 12/08/2011.  CT abdomen 11/27/2011.  Fluoroscopy time of 0.7 minutes was utilized.  Findings:  Intraoperative fluoroscopic images of  the right upper quadrant.  Surgical clips at the level of the cystic duct which is cannulated.  Contrast is injected demonstrating a dilated, patulous CBD.  Distal circumferential narrowing of the CBD occurs but there is relatively prompt transit of contrast to the duodenum.  No CBD filling defect is identified.  No extravasation.  IMPRESSION: Patulous, dilated CBD with the distal beaked appearance which does allow contrast emptying into the duodenum.  Query chronic distal stricture.  No CBD filling defect identified.  Original Report Authenticated By: Harley Hallmark, M.D.    Anti-infectives: Anti-infectives     Start     Dose/Rate Route Frequency Ordered Stop   12/13/11 0100   ciprofloxacin (CIPRO) IVPB 400 mg        400 mg 200 mL/hr over 60 Minutes Intravenous Every 12 hours 12/12/11 2002 12/13/11 1507   12/12/11 0800   ceFAZolin (ANCEF) IVPB 1 g/50 mL premix        1 g 100 mL/hr over 30 Minutes Intravenous 60 min pre-op 12/11/11 1715 12/12/11 1311   12/11/11 1636   ceFAZolin (ANCEF) IVPB 1 g/50 mL premix  Status:  Discontinued        1 g 100 mL/hr over 30 Minutes Intravenous 60 min pre-op 12/11/11 1621 12/11/11 1715   12/11/11 1600   ciprofloxacin (CIPRO) IVPB 400 mg  Status:  Discontinued        400 mg 200 mL/hr over 60 Minutes Intravenous Every  12 hours 12/11/11 1501 12/12/11 2002         Current Facility-Administered Medications  Medication Dose Route Frequency Provider Last Rate Last Dose  . 0.9 %  sodium chloride infusion   Intravenous Continuous Penny Pia, MD 50 mL/hr at 12/13/11 1822    . acetaminophen (TYLENOL) tablet 650 mg  650 mg Oral QID Ardeth Sportsman, MD   650 mg at 12/13/11 2226  . bisacodyl (DULCOLAX) suppository 10 mg  10 mg Rectal Daily PRN Osvaldo Shipper, MD      . ciprofloxacin (CIPRO) IVPB 400 mg  400 mg Intravenous Q12H Ardeth Sportsman, MD   400 mg at 12/13/11 1407  . docusate sodium (COLACE) capsule 100 mg  100 mg Oral BID Gery Pray, MD   100 mg at  12/13/11 2226  . donepezil (ARICEPT) tablet 5 mg  5 mg Oral QHS Debby Crosley, MD   5 mg at 12/13/11 2226  . fentaNYL (DURAGESIC - dosed mcg/hr) patch 25 mcg  25 mcg Transdermal Q72H Gery Pray, MD   25 mcg at 12/14/11 0817  . fentaNYL (SUBLIMAZE) injection 20-50 mcg  20-50 mcg Intravenous Q1H PRN Penny Pia, MD      . fentaNYL (SUBLIMAZE) injection 25 mcg  25 mcg Intravenous Once Sherrie George, [place]      . ferrous sulfate tablet 325 mg  325 mg Oral QPC breakfast Debby Crosley, MD   325 mg at 12/13/11 0830  . hydrocortisone cream 1 %   Topical BID Osvaldo Shipper, MD      . lip balm (CARMEX) ointment 1 application  1 application Topical BID Ardeth Sportsman, MD   1 application at 12/13/11 2227  . magic mouthwash  15 mL Oral QID PRN Ardeth Sportsman, MD      . pantoprazole (PROTONIX) EC tablet 40 mg  40 mg Oral BID AC Debby Crosley, MD   40 mg at 12/14/11 0817  . polyethylene glycol (MIRALAX / GLYCOLAX) packet 17 g  17 g Oral BID Osvaldo Shipper, MD   17 g at 12/13/11 2226  . predniSONE (DELTASONE) tablet 5 mg  5 mg Oral Q breakfast Penny Pia, MD   5 mg at 12/14/11 0817  . traMADol (ULTRAM) tablet 50-100 mg  50-100 mg Oral Q6H PRN Ardeth Sportsman, MD   100 mg at 12/14/11 0824  . DISCONTD: dextrose 5 %-0.45 % sodium chloride infusion   Intravenous Continuous Ardeth Sportsman, MD 75 mL/hr at 12/13/11 1407 1,000 mL at 12/13/11 1407  . DISCONTD: fentaNYL (SUBLIMAZE) bolus via infusion 20-50 mcg  20-50 mcg Intravenous Q1H PRN Ardeth Sportsman, MD      . DISCONTD: HYDROcodone-acetaminophen (NORCO) 5-325 MG per tablet 1 tablet  1 tablet Oral Q4H PRN Gery Pray, MD   1 tablet at 12/12/11 2032  . DISCONTD: hydrocortisone sodium succinate (SOLU-CORTEF) injection 50 mg  50 mg Intravenous Q8H Osvaldo Shipper, MD   50 mg at 12/13/11 1638  . DISCONTD: morphine 2 MG/ML injection 1 mg  1 mg Intravenous Q4H PRN Gery Pray, MD   1 mg at 12/12/11 1852  . DISCONTD: predniSONE (DELTASONE) tablet 5 mg  5 mg Oral  Q breakfast Debby Crosley, MD   5 mg at 12/13/11 0830    Assessment/Plan Chronic cholecystitis with gallstones/ s/p Lap cholecystectomy 12/12/11  1.Cholelithiasis, elevated LFT's, ongoing abdominal pain/constipation; s/p appendectomy, Ventral hernia repair RUQ,/LAP, sigmoid colectomy and biologic mesh rectopexy 11/26/10.  I am concerned she has an ileus.  I am  getting film now.   2.Esophagitis by EGD 11/02/11  3.GERD  4.Polymyalgia Rheumatica  5. Atrial Fib, with prior Coumadin use; stopped Jan. 2013.  6. Polymyalgia Rheumatica/hx temporal arteritis  7.Alzheimers Dementia  8.Mitral Valve prolapse  9.Spinal Stenosis/Scitica  10. Dyslipidemia  11. Thrombocytopenia.  Plan:  NPO, abdominal film now. She will most likely need her IV back in if she has an ileus. Film shows air related to surgery and some non specific bowel gas.  I will put her back on clear liquids, her sister is in room now and says she had a good BM yesterday. She has ambulate, PT, and OT orders.  Will watch for now.   LOS: 6 days    Kashmere Staffa 12/14/2011

## 2011-12-14 NOTE — Progress Notes (Signed)
At shift start, patient was noted to have leaking around IV site.  IVF stopped.  NP paged; received verbal okay to leave IV out since there are no IV meds currently on MAR.

## 2011-12-14 NOTE — Progress Notes (Signed)
Subjective: Pt feels better currently.  Denies any abdominal discomfort.  Objective: Filed Vitals:   Jan 09, 2012 0539 01/09/12 0540 01-09-2012 0542 January 09, 2012 1400  BP: 150/65 142/72 115/58 157/72  Pulse: 82 92 101 84  Temp: 97.9 F (36.6 C)   98.1 F (36.7 C)  TempSrc: Oral   Oral  Resp: 18   18  Height:      Weight:      SpO2: 96%   92%   Weight change:   Intake/Output Summary (Last 24 hours) at 2012/01/09 1925 Last data filed at 2012-01-09 1000  Gross per 24 hour  Intake      0 ml  Output    650 ml  Net   -650 ml    General appearance: alert, cooperative, distracted and no distress  Head: Normocephalic, without obvious abnormality, atraumatic  Resp: clear to auscultation bilaterally  Cardio: regular rate and rhythm, S1, S2 normal, no murmur, click, rub or gallop  GI: gauze in place over umbilicus. No active bleeding or cellulitis observed.  Neurologic: Grossly normal. Confused, but at baseline   Lab Results:  Basename 01-09-2012 0415 12/13/11 0404  NA 136 131*  K 3.7 4.0  CL 105 101  CO2 23 24  GLUCOSE 122* 159*  BUN 11 11  CREATININE 0.49* 0.50  CALCIUM 8.4 8.3*  MG -- --  PHOS -- --    Basename 01/09/2012 0415 12/13/11 0404  AST 55* 69*  ALT 40* 42*  ALKPHOS 556* 589*  BILITOT 1.1 1.3*  PROT 5.2* 5.5*  ALBUMIN 2.5* 2.6*   No results found for this basename: LIPASE:2,AMYLASE:2 in the last 72 hours  Basename Jan 09, 2012 0415 12/13/11 0404  WBC 7.9 10.5  NEUTROABS -- --  HGB 9.0* 10.0*  HCT 29.2* 32.4*  MCV 86.9 86.2  PLT 143* 120*   No results found for this basename: CKTOTAL:3,CKMB:3,CKMBINDEX:3,TROPONINI:3 in the last 72 hours No components found with this basename: POCBNP:3 No results found for this basename: DDIMER:2 in the last 72 hours No results found for this basename: HGBA1C:2 in the last 72 hours No results found for this basename: CHOL:2,HDL:2,LDLCALC:2,TRIG:2,CHOLHDL:2,LDLDIRECT:2 in the last 72 hours No results found for this basename:  TSH,T4TOTAL,FREET3,T3FREE,THYROIDAB in the last 72 hours No results found for this basename: VITAMINB12:2,FOLATE:2,FERRITIN:2,TIBC:2,IRON:2,RETICCTPCT:2 in the last 72 hours  Micro Results: No results found for this or any previous visit (from the past 240 hour(s)).  Studies/Results: Dg Abd 2 Views  2012-01-09  *RADIOLOGY REPORT*  Clinical Data: 78 year old female with abdominal pain and distention.  Postop day #2 from cholecystectomy  ABDOMEN - 2 VIEW  Comparison: 12/08/2011.  Findings: Right upper quadrant surgical clips are new.  Lower abdominal hernia repair sequelae.  Severe scoliosis and spinal degenerative changes.  Bilateral pleural effusions.  Small volume pneumoperitoneum in both upper quadrant.  Air fluid levels in bowel.  No abnormally dilated small bowel loops are identified.  IMPRESSION: 1.  Pneumoperitoneum, presumably related to recent gallbladder surgery. 2.  Nonspecific bowel gas pattern without strong evidence of mechanical obstruction. 3.  Bilateral pleural effusions.  Original Report Authenticated By: Harley Hallmark, M.D.    Medications: I have reviewed the patient's current medications.   1. Cholecystitis: Pt is s/p cholecystectomy pod # 2. Surgery is on board managing.  We will follow up with their recommendations. 2. Thrombocytopenia: Sudden drop in platelet count. Not on heparin products. Improved today.  Will continue to monitor 3. Chronic constipation: Has had multiple BM's with bowel regimen.  4. Recent diagnosis of severe  ulcerative esophagitis: Continue PPI BID.  5. Atrial fibrillation: Taken off of coumadin in January with plans to reinstitute in 2 weeks. Hasn't been done so far. Unclear why. Will let PCP address this. Should be on aspirin in the interim. Will hold for now due to possibility of procedure. Check EKG.  6. Anemia: Stable. Monitor. Anemia panel pending.   7. Hypertension: Stable  8. Polymyalgia rheumatica and Temporal arteritis: Patient on chronic  steroids which will be continued.  9. Full Code  9. DVT prophylaxis: SCD's  10. Hyponatremia: Likely due to poor oral intake. Resolved after hydration.  Disposition:  Pending improved po intake.  Will follow up with surgery's recommendations.    LOS: 6 days   Penny Pia M.D.  [facility] 12/14/2011, 7:25 PM

## 2011-12-14 NOTE — Progress Notes (Signed)
CSW spoke with Katie @ [facility] (ph#: [phone]) re: discharge planning and need for ST-SNF. [name] stated that a bed would not become available until tomorrow. Sister, [name] at bedside and made aware. Sister & patient were very happy to hear that they would be able to be at [facility] SNF. MD made aware. FL2 completed.   Unice Bailey, LCSWA [phone]

## 2011-12-14 NOTE — Progress Notes (Signed)
Physical Therapy Treatment Patient Details Name: Tammy Myers MRN: [id number] DOB: 09-Sep-1921 Today's Date: 12/14/2011  PT Assessment/Plan  PT - Assessment/Plan Comments on Treatment Session: Pt has had cholecystectomy since last visit and now she states she is worried she may not be strong enough to return back to her apt.Today, she ambulated with RW and min assist and then wanted to go go bathroom but was not able to have more than a very small bowel movement.  Pt fatigued after that and returned to bed.  Pt and sister are interested in a short stay in the skilled unit at [facility]. Nursing informed PT Plan: Discharge plan needs to be updated;Frequency remains appropriate PT Frequency: Min 3X/week Follow Up Recommendations: Skilled nursing facility Equipment Recommended: Other (comment);None recommended by PT PT Goals  Acute Rehab PT Goals PT Goal: Supine/Side to Sit - Progress: Not progressing PT Goal: Sit to Supine/Side - Progress: Not progressing PT Goal: Sit to Stand - Progress: Not progressing PT Goal: Stand to Sit - Progress: Not progressing PT Goal: Ambulate - Progress: Not progressing  PT Treatment Precautions/Restrictions  Precautions Required Braces or Orthoses: No Restrictions Weight Bearing Restrictions: No Mobility (including Balance) Bed Mobility Bed Mobility: Yes Supine to Sit: 4: Min assist Supine to Sit Details (indicate cue type and reason): pt needs assist to get LEs to edge of bed Sit to Supine: 4: Min assist Sit to Supine - Details (indicate cue type and reason): assit for legs and to get postitioned in the bed...kyphosis continues Transfers Transfers: Yes Sit to Stand: 4: Min assist;With upper extremity assist Stand to Sit: 4: Min assist;With upper extremity assist Ambulation/Gait Ambulation/Gait: Yes Ambulation/Gait Assistance: 4: Min assist Ambulation/Gait Assistance Details (indicate cue type and reason): kyphosis and forward head in  gait Ambulation Distance (Feet): 200 Feet Assistive device: Rolling walker Gait Pattern: Step-through pattern Stairs: No Wheelchair Mobility Wheelchair Mobility: No  Posture/Postural Control Posture/Postural Control: Postural limitations Postural Limitations: diffuse muscle atrophy and deconditioning with forward head and thoracic kyphoscoliosis Balance Balance Assessed: No Exercise    End of Session PT - End of Session Equipment Utilized During Treatment: Gait belt Activity Tolerance: Patient limited by fatigue Patient left: in bed;with family/visitor present;with call bell in reach Nurse Communication: Mobility status for ambulation General Behavior During Session: [facility] for tasks performed Cognition: [facility] for tasks performed  Donnetta Hail 12/14/2011, 11:51 AM

## 2011-12-15 LAB — CBC
HCT: 33.1 % — ABNORMAL LOW (ref 36.0–46.0)
Hemoglobin: 10.3 g/dL — ABNORMAL LOW (ref 12.0–15.0)
MCHC: 31.1 g/dL (ref 30.0–36.0)
RBC: 3.77 MIL/uL — ABNORMAL LOW (ref 3.87–5.11)

## 2011-12-15 NOTE — Progress Notes (Signed)
Speech Language/Pathology Order for speech language evaluation received, MD please clarify order.  Do you desire speech/language/cognitive evaluation or bedside/clinical swallow evaluation?  Thanks.  Donavan Burnet, MS [facility] SLP [phone]

## 2011-12-15 NOTE — Progress Notes (Signed)
Patient set to discharge to [facility] today. Sister at bedside and ready to transport her to SNF. RN, Lanora Manis made aware. Chart copy packet given to sister to give to SNF.  Unice Bailey, LCSWA [phone]

## 2011-12-15 NOTE — Discharge Summary (Signed)
Admit date: 12/08/2011 Discharge date: 12/15/2011  Primary Care Physician:  [name], MD, MD   Discharge Diagnoses:   Active Hospital Problems  Diagnoses Date Noted   . Chronic cholecystitis with calculus 12/12/2011   . elevated alkaline phosphatase 12/10/2011   . Nonspecific elevation of levels of transaminase or lactic acid dehydrogenase (LDH) 12/10/2011   . Chronically dilated bile duct 12/10/2011   . Ascites 12/10/2011   . Abdominal pain 12/09/2011   . Esophagitis, erosive 11/02/2011   . Anemia 11/01/2011   . Constipation, chronic 08/28/2011   . Hemorrhoids, internal, with prolapse & bleeding 08/28/2011   . MURMUR 10/25/2010   . HYPERTENSION 10/24/2010   . ATRIAL FIBRILLATION, PAROXYSMAL 10/24/2010     Resolved Hospital Problems  Diagnoses Date Noted Date Resolved  . RUQ abdominal tenderness 12/10/2011 12/12/2011     DISCHARGE MEDICATION: Medication List  As of 12/15/2011 11:41 AM   TAKE these medications         ARICEPT 5 MG tablet   Generic drug: donepezil   Take 5 mg by mouth at bedtime as needed. For anxiety      aspirin EC 81 MG tablet   Take 1 tablet (81 mg total) by mouth daily.      CALCIUM + D PO   Take 750 mg by mouth 2 (two) times daily.      docusate sodium 100 MG capsule   Commonly known as: COLACE   Take 100 mg by mouth 2 (two) times daily.      ESTRACE VAGINAL 0.1 MG/GM vaginal cream   Generic drug: estradiol   Once a week.      fentaNYL 25 MCG/HR   Commonly known as: DURAGESIC - dosed mcg/hr   Place 1 patch onto the skin every 3 (three) days.      ferrous sulfate 325 (65 FE) MG tablet   Take 325 mg by mouth daily with breakfast.      HYDROcodone-acetaminophen 5-500 MG per tablet   Commonly known as: VICODIN   Take 1 tablet by mouth every 6 (six) hours as needed. For pain      mulitivitamin with minerals Tabs   Take 1 tablet by mouth daily.      pantoprazole 40 MG tablet   Commonly known as: PROTONIX   Take 1 tablet (40 mg total)  by mouth 2 (two) times daily.      predniSONE 5 MG tablet   Commonly known as: DELTASONE   Take 5 mg by mouth daily.              Consults: Treatment Team:  M[facility], MD   SIGNIFICANT DIAGNOSTIC STUDIES:  Dg Cholangiogram Operative  12/12/2011  *RADIOLOGY REPORT*  Clinical Data:   78 year old female with right upper quadrant pain.  INTRAOPERATIVE CHOLANGIOGRAM  Technique:  Cholangiographic images from the C-arm fluoroscopic device were submitted for interpretation post-operatively.  Please see the procedural report for the amount of contrast and the fluoroscopy time utilized.  Comparison:  Abdominal ultrasound 12/08/2011.  CT abdomen 11/27/2011.  Fluoroscopy time of 0.7 minutes was utilized.  Findings:  Intraoperative fluoroscopic images of the right upper quadrant.  Surgical clips at the level of the cystic duct which is cannulated.  Contrast is injected demonstrating a dilated, patulous CBD.  Distal circumferential narrowing of the CBD occurs but there is relatively prompt transit of contrast to the duodenum.  No CBD filling defect is identified.  No extravasation.  IMPRESSION: Patulous, dilated CBD with the distal  beaked appearance which does allow contrast emptying into the duodenum.  Query chronic distal stricture.  No CBD filling defect identified.  Original Report Authenticated By: Harley Hallmark, M.D.   Nm Hepatobiliary  12/11/2011  *RADIOLOGY REPORT*  Clinical Data:  Right upper quadrant abdominal pain.  NUCLEAR MEDICINE HEPATOBILIARY IMAGING  Technique:  Sequential images of the abdomen were obtained out to 60 minutes following intravenous administration of radiopharmaceutical.  Radiopharmaceutical:  5.42mCi Tc-54m Choletec  Comparison:  Ultrasound abdomen 12/08/2011.  Findings: There is symmetric uptake in the liver and prompt excretion into the biliary tree which is visualized at 15 minutes. Activity is seen in the small bowel at 30 minutes.  The gallbladder was never identified.  The  patient was given morphine at 1 hour with additional imaging performed but no filling of the gallbladder.  Findings consistent with cystic duct obstruction.  IMPRESSION:  1.  Non-visualization of the gallbladder consistent with cystic duct obstruction. 2.  Patent common bile duct.  Original Report Authenticated By: P. Loralie Champagne, M.D.   US Abdomen Complete  12/09/2011  *RADIOLOGY REPORT*  Clinical Data:  Abdominal pain.  ABDOMINAL ULTRASOUND COMPLETE  Comparison:  CT of the abdomen and pelvis performed 11/27/2011, 02/11/2010 and 09/17/2009, and abdominal ultrasound performed 09/17/2009  Findings:  Gallbladder:  On correlation with multiple prior CTs and the prior abdominal ultrasound, the patient has chronic gallbladder wall thickening, which is again appreciated on the current study.  The gallbladder wall measures up to 0.5 cm in thickness.  A 1.6 cm stone is noted within the gallbladder, without evidence for associated obstruction.  Evaluation for ultrasonographic Murphy's sign is limited due to patient sedation.  Trace pericholecystic fluid is nonspecific in the presence of mild abdominal ascites; a small amount of fluid is noted at the left upper quadrant, surrounding the spleen.  Common Bile Duct:  0.9 cm in diameter; dilatation of the common hepatic duct appears stable from prior studies dating back to 2010.  Liver:  Diffusely heterogeneous parenchymal echogenicity noted, possibly reflecting fatty infiltration; no focal lesions identified.  Limited Doppler evaluation demonstrates normal blood flow within the liver.  IVC:  Unremarkable in appearance.  Pancreas:  Dilatation of the pancreatic duct to 0.4 cm is less well characterized on prior studies, though also suggested.  The visualized portions of the pancreas are otherwise unremarkable.  Spleen:  11.7 cm in length; within normal limits in size and echotexture.  Right kidney:  9.7 cm in length; normal in size, configuration and parenchymal echogenicity.   No evidence of mass or hydronephrosis.  Left kidney:  12.9 cm in length; normal in size, configuration and parenchymal echogenicity.  No evidence of mass or hydronephrosis.  Abdominal Aorta:  Normal in caliber; no aneurysm identified.  Small bilateral pleural effusions are noted.  IMPRESSION:  1.  On comparison with prior studies dating back to 2010, there is chronic gallbladder wall thickening, pericholecystic fluid and dilatation of the common hepatic duct; dilatation of the pancreatic duct may also be stable.  Findings may reflect chronic cholecystitis or obstruction, though no distal obstructing stone has been identified; the appearance is unchanged on the current study. 2.  Cholelithiasis noted. 3.  Question of mild fatty infiltration within the liver. 4.  Trace ascites again noted in the abdomen. 5.  Small bilateral pleural effusions seen.  Original Report Authenticated By: Tonia Ghent, M.D.   Ct Abdomen Pelvis W Contrast  11/27/2011  *RADIOLOGY REPORT*  Clinical Data: Chronic abdominal pain, pelvic pain.  CT  ABDOMEN AND PELVIS WITH CONTRAST  Technique:  Multidetector CT imaging of the abdomen and pelvis was performed following the standard protocol during bolus administration of intravenous contrast.  Contrast: 80mL OMNIPAQUE IOHEXOL 300 MG/ML IV SOLN .  Small infiltration of contrast occurred into the left forearm, approximately 10-15 ml.  After removal of the IV, there was also some bleeding into the soft tissues. Following the procedure, the patient was without complaint and was instructed to keep arm elevated with occasional ice pack.  Comparison: 02/11/2010  Findings: Moderate sized hiatal hernia, similar to prior study. There are small bilateral pleural effusions and compressive atelectasis in the lower lobes bilaterally.  Large stool burden within the colon.  Small bowel is decompressed. Surgical suture line in the sigmoid colon.  There is a small amount of free fluid adjacent to the liver and  spleen.  No focal lesion in the liver or spleen.  No focal lesion in the pancreas, or adrenals.  Small cysts in the kidneys bilaterally.  No hydronephrosis.  Gallbladder difficult to visualize due to respiratory motion. There is a small amount of fluid around the gallbladder, possibly related to the perihepatic ascites rather than true gallbladder disease.  Dense coronary artery calcifications are present.  Slight aneurysmal dilatation of the ascending thoracic aorta at the aortic root, 4.0 cm.  Mild cardiomegaly.  There is levoscoliosis in the lumbar spine with associated degenerative changes.  Aorta and iliac vessels are heavily calcified.  IMPRESSION: Small bilateral pleural effusions with compressive atelectasis in the lower lobes.  Small amount of perihepatic and perisplenic ascites.  Fluid is also noted around the gallbladder which may be related to the ascites rather than gallbladder disease.  Recommend clinical correlation for any symptoms referable to the gallbladder and if further evaluation is felt warranted, ultrasound may be of benefit.  Large stool burden throughout the colon suggesting constipation.  Slight dilatation of the aortic root, 4.0 cm.  Cardiomegaly, coronary artery disease.  Original Report Authenticated By: Cyndie Chime, M.D.   Dg Abd 2 Views  12/14/2011  *RADIOLOGY REPORT*  Clinical Data: 78 year old female with abdominal pain and distention.  Postop day #2 from cholecystectomy  ABDOMEN - 2 VIEW  Comparison: 12/08/2011.  Findings: Right upper quadrant surgical clips are new.  Lower abdominal hernia repair sequelae.  Severe scoliosis and spinal degenerative changes.  Bilateral pleural effusions.  Small volume pneumoperitoneum in both upper quadrant.  Air fluid levels in bowel.  No abnormally dilated small bowel loops are identified.  IMPRESSION: 1.  Pneumoperitoneum, presumably related to recent gallbladder surgery. 2.  Nonspecific bowel gas pattern without strong evidence of  mechanical obstruction. 3.  Bilateral pleural effusions.  Original Report Authenticated By: Harley Hallmark, M.D.   Dg Abd 2 Views  12/08/2011  *RADIOLOGY REPORT*  Clinical Data: Lower abdominal pain.  ABDOMEN - 2 VIEW  Comparison: Pelvic radiograph performed 03/20/2011, and CT of the abdomen and pelvis performed 11/27/2011  Findings: The visualized bowel gas pattern is unremarkable. Scattered air and stool filled loops of colon are seen; no abnormal dilatation of small bowel loops is seen to suggest small bowel obstruction.  No free intra-abdominal air is identified on the provided upright view.  Left convex lumbar scoliosis is noted, with associated degenerative change.  Diffuse vascular calcifications are seen.  A mesh is noted overlying the pelvis.  Small bilateral pleural effusions are seen, with associated atelectasis.  Chronically increased interstitial markings are seen; peribronchial thickening and diffuse calcification of the tracheobronchial tree  are seen.  The cardiomediastinal silhouette is mildly enlarged.  IMPRESSION:  1.  Unremarkable bowel gas pattern; no free intra-abdominal air seen. 2.  Small bilateral pleural effusions, with associated atelectasis. 3.  Chronic lung changes noted. 4.  Mild cardiomegaly. 5.  Left convex lumbar scoliosis. 6.  Diffuse vascular calcifications seen.  Original Report Authenticated By: Tonia Ghent, M.D.      CARDIAC CATH & OTHER PROCEDURES: Lap cholecystectomy 12/12/11   No results found for this or any previous visit (from the past 240 hour(s)).  BRIEF ADMITTING H & P: This is a 78 year old female resident of an independent living center. She came in with complaints of abdominal pain. She stated initially that her pain is generalized. This discomfort reportedly had been ongoing for approximately 4 months and prior to presentation to the ED was getting worse.  Was found to have cholecystitis with gallstones and after a General Surgery evaluation it was  decided to perform a Lap cholecystectomy on 12/12/11.  Patient was able to get her diet advanced eventually post op.  Below find the problem list which was addressed while patient was hospitalized.  1. Cholecystitis: Pt is s/p cholecystectomy pod # 3. Patient is to follow up with surgeon as indicated below. 2. Thrombocytopenia: Sudden drop in platelet count. Not on heparin products. Etiology uncertain.  Patient stable with no active bleeding. 3. Chronic constipation: Has had multiple BM's with bowel regimen (colace, 4. Recent diagnosis of severe ulcerative esophagitis: Continue PPI BID patient should follow up with her GI doctor for further recommendations. 5. Atrial fibrillation: Taken off of coumadin in January with plans to reinstitute in 2 weeks. Hasn't been done so far. Unclear why. Will let PCP address this. Should be on aspirin in the interim. Will hold for now due to possibility of procedure. Check EKG.  6. Anemia: Stable. Monitor. Patient will be discharged on Iron supplement.  Likely due to Iron deficiency anemia and recent operation. 7. Hypertension: Stable while inpatient and has fluctuated.  Will plan on continuing home regimen and have it further addressed as outpatient. 8. Polymyalgia rheumatica and Temporal arteritis: Patient on chronic steroids which will be continued.  9. Full Code  9. DVT prophylaxis: SCD's  10. Hyponatremia: Likely due to poor oral intake. Resolved after hydration.  Disposition to [facility].  Active Hospital Problems  Diagnoses Date Noted   . Chronic cholecystitis with calculus 12/12/2011   . elevated alkaline phosphatase 12/10/2011   . Nonspecific elevation of levels of transaminase or lactic acid dehydrogenase (LDH) 12/10/2011   . Chronically dilated bile duct 12/10/2011   . Ascites 12/10/2011   . Abdominal pain 12/09/2011   . Esophagitis, erosive 11/02/2011   . Anemia 11/01/2011   . Constipation, chronic 08/28/2011   . Hemorrhoids, internal, with  prolapse & bleeding 08/28/2011   . MURMUR 10/25/2010   . HYPERTENSION 10/24/2010   . ATRIAL FIBRILLATION, PAROXYSMAL 10/24/2010     Resolved Hospital Problems  Diagnoses Date Noted Date Resolved  . RUQ abdominal tenderness 12/10/2011 12/12/2011     Disposition and Follow-up: As indicated below Discharge Orders    Future Appointments: Provider: Department: Dept Phone: Center:   12/25/2011 11:30 AM Ardeth Sportsman, MD [facility] [phone] None   12/29/2011 2:00 PM [facility] [phone] [facility]   01/17/2012 3:00 PM Hilarie Fredrickson, MD [facility] [phone] [facility]     Future Orders Please Complete By Expires   Diet - low sodium heart healthy  Increase activity slowly      Discharge instructions      Comments:   Dr. Michaell Cowing in 2 weeks at [facility] [address] [phone]   Call MD for:  temperature >100.4      Call MD for:  persistant nausea and vomiting      Call MD for:  severe uncontrolled pain        Follow-up Information    Follow up with GROSS,STEVEN C., MD in 2 weeks. (Call for appointment)    Contact information:   [facility] [address] [place] [address] [phone]           DISCHARGE EXAM:   General appearance: alert, cooperative, distracted and no distress  Head: Normocephalic, EOMI, PERRLA without obvious abnormality, atraumatic  Resp: clear to auscultation bilaterally  Cardio: regular rate and rhythm, S1, S2 normal, no murmur, click, rub or gallop  GI: Incision intact. No active bleeding or cellulitis observed.  Neurologic: Grossly normal. Confused, but at baseline   Blood pressure 155/72, pulse 74, temperature 98.2 F (36.8 C), temperature source Oral, resp. rate 20, height 5\' 6"  (1.676 m), weight 46.72 kg (103 lb), SpO2 91.00%.   Basename 12/14/11 0415 12/13/11 0404  NA 136 131*  K 3.7 4.0  CL 105 101  CO2 23 24  GLUCOSE 122* 159*    BUN 11 11  CREATININE 0.49* 0.50  CALCIUM 8.4 8.3*  MG -- --  PHOS -- --    Basename 12/14/11 0415 12/13/11 0404  AST 55* 69*  ALT 40* 42*  ALKPHOS 556* 589*  BILITOT 1.1 1.3*  PROT 5.2* 5.5*  ALBUMIN 2.5* 2.6*   No results found for this basename: LIPASE:2,AMYLASE:2 in the last 72 hours  Basename 12/15/11 0353 12/14/11 0415  WBC 8.0 7.9  NEUTROABS -- --  HGB 10.3* 9.0*  HCT 33.1* 29.2*  MCV 87.8 86.9  PLT 127* 143*    Signed: Penny Pia M.D. 12/15/2011, 11:41 AM

## 2011-12-15 NOTE — Progress Notes (Addendum)
Patient with small amount of liquid stool earlier, now with 100 cc of emesis. (had taken hs med about 40 minutes ago with apple juice).  Given ice chips with "improvement" in nausea. Will continue to monitor.

## 2011-12-15 NOTE — Progress Notes (Signed)
3 Days Post-Op  Subjective: C/o abdominal pain,  Had large BM per her and sister yesterday.  Long hx of constipation, which medicine is treating her for  Objective: Vital signs in last 24 hours: Temp:  [97.9 F (36.6 C)-98.2 F (36.8 C)] 98.2 F (36.8 C) (03/08 0518) Pulse Rate:  [68-84] 74  (03/08 0518) Resp:  [18-20] 20  (03/08 0518) BP: (134-157)/(71-72) 155/72 mmHg (03/08 0518) SpO2:  [91 %-96 %] 91 % (03/08 0518) Last BM Date: 12/13/11  Intake/Output from previous day: 24-Dec-2022 0701 - 03/08 0700 In: 520 [P.O.:520] Out: 425 [Urine:325; Emesis/NG output:100] Intake/Output this shift:    PE:  Alert, c/o abdominal pain.  Abd:  She is still distended lower abdomen.  Like yesterday. Incision looks good, +BS she reports Flatus  Lab Results:   Basename 12/15/11 0353 December 24, 2011 0415  WBC 8.0 7.9  HGB 10.3* 9.0*  HCT 33.1* 29.2*  PLT 127* 143*    Lab 2011/12/24 0415 12/13/11 0404 12/12/11 0350 12/11/11 0316 12/10/11 0350  AST 55* 69* 72* 66* 62*  ALT 40* 42* 43* 39* 38*  ALKPHOS 556* 589* 583* 574* 588*  BILITOT 1.1 1.3* 1.3* 1.3* 1.7*  PROT 5.2* 5.5* 5.4* 5.4* 5.8*  ALBUMIN 2.5* 2.6* 2.6* 2.6* 2.8*    BMET  Basename 2011/12/24 0415 12/13/11 0404  NA 136 131*  K 3.7 4.0  CL 105 101  CO2 23 24  GLUCOSE 122* 159*  BUN 11 11  CREATININE 0.49* 0.50  CALCIUM 8.4 8.3*   PT/INR No results found for this basename: LABPROT:2,INR:2 in the last 72 hours   Studies/Results: Dg Abd 2 Views  2011/12/24  *RADIOLOGY REPORT*  Clinical Data: 78 year old female with abdominal pain and distention.  Postop day #2 from cholecystectomy  ABDOMEN - 2 VIEW  Comparison: 12/08/2011.  Findings: Right upper quadrant surgical clips are new.  Lower abdominal hernia repair sequelae.  Severe scoliosis and spinal degenerative changes.  Bilateral pleural effusions.  Small volume pneumoperitoneum in both upper quadrant.  Air fluid levels in bowel.  No abnormally dilated small bowel loops are identified.   IMPRESSION: 1.  Pneumoperitoneum, presumably related to recent gallbladder surgery. 2.  Nonspecific bowel gas pattern without strong evidence of mechanical obstruction. 3.  Bilateral pleural effusions.  Original Report Authenticated By: Harley Hallmark, M.D.    Anti-infectives: Anti-infectives     Start     Dose/Rate Route Frequency Ordered Stop   12/13/11 0100   ciprofloxacin (CIPRO) IVPB 400 mg        400 mg 200 mL/hr over 60 Minutes Intravenous Every 12 hours 12/12/11 2002 12/13/11 1507   12/12/11 0800   ceFAZolin (ANCEF) IVPB 1 g/50 mL premix        1 g 100 mL/hr over 30 Minutes Intravenous 60 min pre-op 12/11/11 1715 12/12/11 1311   12/11/11 1636   ceFAZolin (ANCEF) IVPB 1 g/50 mL premix  Status:  Discontinued        1 g 100 mL/hr over 30 Minutes Intravenous 60 min pre-op 12/11/11 1621 12/11/11 1715   12/11/11 1600   ciprofloxacin (CIPRO) IVPB 400 mg  Status:  Discontinued        400 mg 200 mL/hr over 60 Minutes Intravenous Every 12 hours 12/11/11 1501 12/12/11 2002         Current Facility-Administered Medications  Medication Dose Route Frequency Provider Last Rate Last Dose  . acetaminophen (TYLENOL) tablet 650 mg  650 mg Oral QID Ardeth Sportsman, MD   650 mg  at 12/14/11 2212  . bisacodyl (DULCOLAX) suppository 10 mg  10 mg Rectal Daily PRN Osvaldo Shipper, MD      . docusate sodium (COLACE) capsule 100 mg  100 mg Oral BID Gery Pray, MD   100 mg at 12/14/11 2212  . donepezil (ARICEPT) tablet 5 mg  5 mg Oral QHS Debby Crosley, MD   5 mg at 12/14/11 2212  . fentaNYL (DURAGESIC - dosed mcg/hr) patch 25 mcg  25 mcg Transdermal Q72H Gery Pray, MD   25 mcg at 12/14/11 0817  . fentaNYL (SUBLIMAZE) injection 20-50 mcg  20-50 mcg Intravenous Q1H PRN Penny Pia, MD      . fentaNYL (SUBLIMAZE) injection 25 mcg  25 mcg Intravenous Once Sherrie George, [place]      . ferrous sulfate tablet 325 mg  325 mg Oral QPC breakfast Debby Crosley, MD   325 mg at 12/15/11 0815  .  hydrocortisone cream 1 %   Topical BID Osvaldo Shipper, MD      . lip balm (CARMEX) ointment 1 application  1 application Topical BID Ardeth Sportsman, MD   1 application at 12/14/11 2219  . magic mouthwash  15 mL Oral QID PRN Ardeth Sportsman, MD      . ondansetron [facility]) tablet 4 mg  4 mg Oral Q8H PRN Penny Pia, MD      . pantoprazole (PROTONIX) EC tablet 40 mg  40 mg Oral BID AC Debby Crosley, MD   40 mg at 12/15/11 0815  . polyethylene glycol (MIRALAX / GLYCOLAX) packet 17 g  17 g Oral BID Osvaldo Shipper, MD   17 g at 12/14/11 2212  . predniSONE (DELTASONE) tablet 5 mg  5 mg Oral Q breakfast Penny Pia, MD   5 mg at 12/15/11 0815  . traMADol (ULTRAM) tablet 50-100 mg  50-100 mg Oral Q6H PRN Ardeth Sportsman, MD   100 mg at 12/14/11 0824  . DISCONTD: 0.9 %  sodium chloride infusion   Intravenous Continuous Penny Pia, MD 50 mL/hr at 12/13/11 4401      Assessment/Plan Chronic cholecystitis with gallstones/ s/p Lap cholecystectomy 12/12/11  1.Cholelithiasis, elevated LFT's, ongoing abdominal pain/constipation; s/p appendectomy, Ventral hernia repair RUQ,/LAP, sigmoid colectomy and biologic mesh rectopexy 11/26/10.  2.Esophagitis by EGD 11/02/11  3.GERD  4.Polymyalgia Rheumatica  5. Atrial Fib, with prior Coumadin use; stopped Jan. 2013.  6. Polymyalgia Rheumatica/hx temporal arteritis  7.Alzheimers Dementia  8.Mitral Valve prolapse  9.Spinal Stenosis/Scitica  10. Dyslipidemia  11. Thrombocytopenia.   I think from her surgery she is doing well, The lower bowel gas is a problem, along with her constipation.  I would advance her diet as tolerated, ambulate her, and see if doesn't resolve her issues.  She can follow up in the clinic with Dr. Michaell Cowing in 2 weeks.  Instructions are in D/c AVS.  LOS: 7 days    Tammy Myers 12/15/2011

## 2011-12-16 NOTE — Progress Notes (Signed)
Recovering OK from urgent surgery Max non-narcotic pain control Mobilize Bowel regimen

## 2011-12-16 NOTE — Progress Notes (Signed)
Try to move more Get on bowel regimen to help If worse - recheck  Labs but trend has been good

## 2011-12-18 ENCOUNTER — Telehealth (INDEPENDENT_AMBULATORY_CARE_PROVIDER_SITE_OTHER): Payer: Self-pay | Admitting: Surgery

## 2011-12-18 NOTE — Telephone Encounter (Signed)
Pt needs 2wk po, discharged on 12/15/11, please call

## 2011-12-18 NOTE — Telephone Encounter (Signed)
LMOM at pt's home and sister's home to give them the pt's f/u appt with Dr Michaell Cowing.

## 2011-12-25 ENCOUNTER — Encounter (INDEPENDENT_AMBULATORY_CARE_PROVIDER_SITE_OTHER): Payer: Self-pay | Admitting: Surgery

## 2011-12-25 ENCOUNTER — Ambulatory Visit (INDEPENDENT_AMBULATORY_CARE_PROVIDER_SITE_OTHER): Payer: Medicare Other | Admitting: Surgery

## 2011-12-25 VITALS — BP 128/70 | HR 88 | Temp 99.0°F | Resp 16 | Ht 63.0 in | Wt 94.8 lb

## 2011-12-25 DIAGNOSIS — R932 Abnormal findings on diagnostic imaging of liver and biliary tract: Secondary | ICD-10-CM

## 2011-12-25 DIAGNOSIS — K8010 Calculus of gallbladder with chronic cholecystitis without obstruction: Secondary | ICD-10-CM

## 2011-12-25 DIAGNOSIS — K5900 Constipation, unspecified: Secondary | ICD-10-CM

## 2011-12-25 DIAGNOSIS — R748 Abnormal levels of other serum enzymes: Secondary | ICD-10-CM

## 2011-12-25 DIAGNOSIS — K5909 Other constipation: Secondary | ICD-10-CM

## 2011-12-25 NOTE — Progress Notes (Signed)
Subjective:     Patient ID: Tammy Myers, female   DOB: July 08, 1921, 78 y.o.   MRN: [id number]  HPI  [facility]  1920/12/05 [id number]  Patient Care Team: [name], MD as PCP - General (Internal Medicine) Theda Belfast, MD as Consulting Physician (Gastroenterology) Herby Abraham, MD as Consulting Physician (Cardiology) Robley Fries, MD as Attending Physician (Obstetrics and Gynecology) Man Nedra Hai, OTR as Nurse Practitioner (Nurse Practitioner)  This patient is a 78 y.o.female who presents today for surgical evaluation.   Procedure: Laparoscopic cholecystectomy with cholangiogram.  Pathology: Chronic cholecystitis and cholecystolithiasis  The patient comes today with her sister. She is back at her nursing home after being hospital for a few weeks and with numerous issues including cholecystitis and abdominal pain and esophagitis.. She is eating relatively well. Appetite is fair but has always been that way. Claims to have bowel movements. Some flatus.  However she feels full. Some abdominal discomfort. No nausea or vomiting. Sometimes crampy. No bleeding. Feels a little white count. In a wheelchair now. It was in hospital for some time  Patient Active Problem List  Diagnoses  . HYPERTENSION  . ATRIAL FIBRILLATION, PAROXYSMAL  . OSTEOARTHRITIS  . MURMUR  . Constipation, chronic  . Hemorrhoids, internal, with prolapse & bleeding  . Anemia  . Esophagitis, erosive  . Abdominal pain  . elevated alkaline phosphatase  . Nonspecific elevation of levels of transaminase or lactic acid dehydrogenase (LDH)  . Chronically dilated bile duct  . Ascites  . Chronic cholecystitis with calculus    Past Medical History  Diagnosis Date  . PMR (polymyalgia rheumatica)   . Temporal arteritis   . Osteoporosis   . Hearing loss   . Wears glasses   . Rectal bleeding   . Arthritis   . Rectal prolapse   . Hypertension   . Mitral valve prolapse   . Spinal stenosis   .  Alzheimer's dementia   . Hyperlipidemia   . GERD (gastroesophageal reflux disease)   . Sciatica   . Atrial fibrillation     Past Surgical History  Procedure Date  . Abdominal hysterectomy   . Appendectomy   . Colonoscopy   . Breast surgery   . Hernia repair     LIH  . Esophagogastroduodenoscopy 11/02/2011    Procedure: ESOPHAGOGASTRODUODENOSCOPY (EGD);  Surgeon: Yancey Flemings, MD;  Location: [facility] ENDOSCOPY;  Service: Endoscopy;  Laterality: N/A;  . Rectal prolapse repair, rectopexy 11/16/2010    with sigmoid colectomy.  Repair w biologic mesh in pelvis  . Cholecystectomy 12/12/11    History   Social History  . Marital Status: Widowed    Spouse Name: N/A    Number of Children: N/A  . Years of Education: N/A   Occupational History  . Not on file.   Social History Main Topics  . Smoking status: Never Smoker   . Smokeless tobacco: Not on file  . Alcohol Use: No  . Drug Use: No  . Sexually Active:    Other Topics Concern  . Not on file   Social History Narrative  . No narrative on file    Family History  Problem Relation Age of Onset  . Cancer Brother     bladder    Current outpatient prescriptions:aspirin EC 81 MG tablet, Take 1 tablet (81 mg total) by mouth daily., Disp: , Rfl: ;  Calcium Carbonate-Vitamin D (CALCIUM + D PO), Take 750 mg by mouth 2 (two) times daily.  ,  Disp: , Rfl: ;  docusate sodium (COLACE) 100 MG capsule, Take 100 mg by mouth 2 (two) times daily.  , Disp: , Rfl: ;  donepezil (ARICEPT) 5 MG tablet, Take 5 mg by mouth at bedtime as needed. For anxiety, Disp: , Rfl:  ESTRACE VAGINAL 0.1 MG/GM vaginal cream, Once a week., Disp: , Rfl: ;  fentaNYL (DURAGESIC - DOSED MCG/HR) 25 MCG/HR, Place 1 patch onto the skin every 3 (three) days., Disp: , Rfl: ;  ferrous sulfate 325 (65 FE) MG tablet, Take 325 mg by mouth daily with breakfast., Disp: , Rfl: ;  HYDROcodone-acetaminophen (VICODIN) 5-500 MG per tablet, Take 1 tablet by mouth every 6 (six) hours as needed. For  pain, Disp: , Rfl:  Multiple Vitamin (MULITIVITAMIN WITH MINERALS) TABS, Take 1 tablet by mouth daily., Disp: , Rfl: ;  pantoprazole (PROTONIX) 40 MG tablet, Take 1 tablet (40 mg total) by mouth 2 (two) times daily., Disp: 60 tablet, Rfl: 3;  predniSONE (DELTASONE) 5 MG tablet, Take 5 mg by mouth daily.  , Disp: , Rfl:   Allergies  Allergen Reactions  . Fosamax     BP 128/70  Pulse 88  Temp(Src) 99 F (37.2 C) (Temporal)  Resp 16  Ht 5\' 3"  (1.6 m)  Wt 94 lb 12.8 oz (43.001 kg)  BMI 16.79 kg/m2     Review of Systems  Constitutional: Positive for fatigue. Negative for fever, chills, diaphoresis and unexpected weight change.  HENT: Negative for ear pain, sore throat and trouble swallowing.   Eyes: Negative for photophobia and visual disturbance.  Respiratory: Negative for cough and choking.   Cardiovascular: Negative for chest pain and palpitations.  Gastrointestinal: Positive for abdominal pain. Negative for nausea, vomiting, diarrhea, blood in stool, anal bleeding and rectal pain.  Genitourinary: Negative for dysuria, frequency and difficulty urinating.  Musculoskeletal: Negative for myalgias and gait problem.  Skin: Negative for color change, pallor and rash.  Neurological: Negative for dizziness, syncope, speech difficulty, weakness, numbness and headaches.  Hematological: Negative for adenopathy.  Psychiatric/Behavioral: Negative for confusion and agitation. The patient is not nervous/anxious.        Objective:   Physical Exam  Constitutional: She is oriented to person, place, and time. She appears well-developed and well-nourished. No distress.  HENT:  Head: Normocephalic.  Mouth/Throat: Oropharynx is clear and moist. No oropharyngeal exudate.  Eyes: Conjunctivae and EOM are normal. Pupils are equal, round, and reactive to light. No scleral icterus.  Neck: Normal range of motion. No tracheal deviation present.  Cardiovascular: Normal rate and intact distal pulses.     Pulmonary/Chest: Effort normal. No respiratory distress. She exhibits no tenderness.  Abdominal: Soft. Hernia confirmed negative in the right inguinal area and confirmed negative in the left inguinal area.       Incisions clean with normal healing ridges.  No hernias.  Mild/mod distended.  Mild discomfort periumbilical.  No TTP RUQ.  No Murphy's.  No peritonitis.  Genitourinary: No vaginal discharge found.       Perianal skin clean with good hygiene.  No pruritis.  No pilonidal disease.  No fissure.  No abscess/fistula.    Tolerates digital and anoscopic rectal exam.  Normal sphincter tone.  No rectal masses.  Hemorrhoidal piles WNL except R posterior mild prolapse, no bleeding/pain - refuses banding / injection   Musculoskeletal: Normal range of motion. She exhibits no tenderness.       Severe kyphosis unchanged  Lymphadenopathy:       Right: No inguinal  adenopathy present.       Left: No inguinal adenopathy present.  Neurological: She is alert and oriented to person, place, and time. No cranial nerve deficit. She exhibits normal muscle tone. Coordination normal.  Skin: Skin is warm and dry. No rash noted. She is not diaphoretic.  Psychiatric: She has a normal mood and affect. Her behavior is normal.       Assessment:     Abd pain s/p lap chole  Mildly prolapsing hemorrhoid - declines Tx    Plan:     CBC  CMET  Abd U/S r/o biloma  Bowel regimen - Miralax BID (has a h/o high stool burden)  CT scan if worse or nor improving  RTC 2-3 weeks to see if better

## 2011-12-25 NOTE — Patient Instructions (Signed)
1.  Get bloodwork (CBC, CMET)  2.  Get Ultrasound to evaluate area of gallbladder surgery  3.  Call if not better/worse  4.  Get your bowels moving better : Try Miralax 2x / day  GETTING TO GOOD BOWEL HEALTH. Irregular bowel habits such as constipation and diarrhea can lead to many problems over time.  Having one soft bowel movement a day is the most important way to prevent further problems.  The anorectal canal is designed to handle stretching and feces to safely manage our ability to get rid of solid waste (feces, poop, stool) out of our body.  BUT, hard constipated stools can act like ripping concrete bricks and diarrhea can be a burning fire to this very sensitive area of our body, causing inflamed hemorrhoids, anal fissures, increasing risk is perirectal abscesses, abdominal pain/bloating, an making irritable bowel worse.     The goal: ONE SOFT BOWEL MOVEMENT A DAY!  To have soft, regular bowel movements:    Drink at least 8 tall glasses of water a day.     Take plenty of fiber.  Fiber is the undigested part of plant food that passes into the colon, acting s "natures broom" to encourage bowel motility and movement.  Fiber can absorb and hold large amounts of water. This results in a larger, bulkier stool, which is soft and easier to pass. Work gradually over several weeks up to 6 servings a day of fiber (25g a day even more if needed) in the form of: o Vegetables -- Root (potatoes, carrots, turnips), leafy green (lettuce, salad greens, celery, spinach), or cooked high residue (cabbage, broccoli, etc) o Fruit -- Fresh (unpeeled skin & pulp), Dried (prunes, apricots, cherries, etc ),  or stewed ( applesauce)  o Whole grain breads, pasta, etc (whole wheat)  o Bran cereals    Bulking Agents -- This type of water-retaining fiber generally is easily obtained each day by one of the following:  o Psyllium bran -- The psyllium plant is remarkable because its ground seeds can retain so much water. This  product is available as Metamucil, Konsyl, Effersyllium, Per Diem Fiber, or the less expensive generic preparation in drug and health food stores. Although labeled a laxative, it really is not a laxative.  o Methylcellulose -- This is another fiber derived from wood which also retains water. It is available as Citrucel. o Polyethylene Glycol - and "artificial" fiber commonly called Miralax or Glycolax.  It is helpful for people with gassy or bloated feelings with regular fiber o Flax Seed - a less gassy fiber than psyllium   No reading or other relaxing activity while on the toilet. If bowel movements take longer than 5 minutes, you are too constipated   AVOID CONSTIPATION.  High fiber and water intake usually takes care of this.  Sometimes a laxative is needed to stimulate more frequent bowel movements, but    Laxatives are not a good long-term solution as it can wear the colon out. o Osmotics (Milk of Magnesia, Fleets phosphosoda, Magnesium citrate, MiraLax, GoLytely) are safer than  o Stimulants (Senokot, Castor Oil, Dulcolax, Ex Lax)    o Do not take laxatives for more than 7days in a row.    IF SEVERELY CONSTIPATED, try a Bowel Retraining Program: o Do not use laxatives.  o Eat a diet high in roughage, such as bran cereals and leafy vegetables.  o Drink six (6) ounces of prune or apricot juice each morning.  o Eat two (2)  large servings of stewed fruit each day.  o Take one (1) heaping tablespoon of a psyllium-based bulking agent twice a day. Use sugar-free sweetener when possible to avoid excessive calories.  o Eat a normal breakfast.  o Set aside 15 minutes after breakfast to sit on the toilet, but do not strain to have a bowel movement.  o If you do not have a bowel movement by the third day, use an enema and repeat the above steps.    Controlling diarrhea o Switch to liquids and simpler foods for a few days to avoid stressing your intestines further. o Avoid dairy products (especially  milk & ice cream) for a short time.  The intestines often can lose the ability to digest lactose when stressed. o Avoid foods that cause gassiness or bloating.  Typical foods include beans and other legumes, cabbage, broccoli, and dairy foods.  Every person has some sensitivity to other foods, so listen to our body and avoid those foods that trigger problems for you. o Adding fiber (Citrucel, Metamucil, psyllium, Miralax) gradually can help thicken stools by absorbing excess fluid and retrain the intestines to act more normally.  Slowly increase the dose over a few weeks.  Too much fiber too soon can backfire and cause cramping & bloating. o Probiotics (such as active yogurt, Align, etc) may help repopulate the intestines and colon with normal bacteria and calm down a sensitive digestive tract.  Most studies show it to be of mild help, though, and such products can be costly. o Medicines:   Bismuth subsalicylate (ex. Kayopectate, Pepto Bismol) every 30 minutes for up to 6 doses can help control diarrhea.  Avoid if pregnant.   Loperamide (Immodium) can slow down diarrhea.  Start with two tablets (4mg  total) first and then try one tablet every 6 hours.  Avoid if you are having fevers or severe pain.  If you are not better or start feeling worse, stop all medicines and call your doctor for advice o Call your doctor if you are getting worse or not better.  Sometimes further testing (cultures, endoscopy, X-ray studies, bloodwork, etc) may be needed to help diagnose and treat the cause of the diarrhea. o

## 2011-12-28 ENCOUNTER — Ambulatory Visit
Admission: RE | Admit: 2011-12-28 | Discharge: 2011-12-28 | Disposition: A | Discharge status: Home / Self Care | Payer: No Typology Code available for payment source | Source: Ambulatory Visit | Attending: Surgery | Admitting: Surgery

## 2011-12-28 ENCOUNTER — Other Ambulatory Visit: Payer: Medicare Other

## 2011-12-28 ENCOUNTER — Telehealth (INDEPENDENT_AMBULATORY_CARE_PROVIDER_SITE_OTHER): Payer: Self-pay | Admitting: General Surgery

## 2011-12-28 DIAGNOSIS — K8010 Calculus of gallbladder with chronic cholecystitis without obstruction: Secondary | ICD-10-CM

## 2011-12-28 DIAGNOSIS — R932 Abnormal findings on diagnostic imaging of liver and biliary tract: Secondary | ICD-10-CM

## 2011-12-28 DIAGNOSIS — K5909 Other constipation: Secondary | ICD-10-CM

## 2011-12-28 NOTE — Telephone Encounter (Signed)
Message copied by Liliana Cline on Thu Dec 28, 2011  2:32 PM ------      Message from: Ardeth Sportsman      Created: Thu Dec 28, 2011  2:00 PM       Tell pt the good news!  No biloma or obvious leak.  Dilated Common Bile Duct unchanged from before/during surgery

## 2011-12-28 NOTE — Telephone Encounter (Signed)
Left message for patient to call back and ask for me 

## 2011-12-28 NOTE — Telephone Encounter (Signed)
[facility]ammy Myers with [facility] called for lab order for lab work. Patient did not have drawn when she was out getting her scan today. Order for CBC and CMET faxed to [facility] at [phone]. Called back at [phone] to make them aware we were faxing lab work and to give test results. No answer on nursing line.

## 2012-01-03 ENCOUNTER — Telehealth (INDEPENDENT_AMBULATORY_CARE_PROVIDER_SITE_OTHER): Payer: Self-pay

## 2012-01-03 NOTE — Telephone Encounter (Signed)
LMOM for pt or her sister Tammy Myers to call me so I can see how the pt is feeling after lab studies being done.

## 2012-01-04 ENCOUNTER — Telehealth (INDEPENDENT_AMBULATORY_CARE_PROVIDER_SITE_OTHER): Payer: Self-pay

## 2012-01-04 NOTE — Telephone Encounter (Signed)
The pt's brother n law returned my call about the pt. He states the pt is feeling a little better each day with her stomach issues. He states the pt has not been c/o her stomach hurting anymore. I did advise that the labs were still elevated but if the pt is feeling better we would not do anything at this time. I did advise if the pt gets worse to call our office per Dr Michaell Cowing.

## 2012-01-17 ENCOUNTER — Encounter: Payer: Medicare Other | Admitting: Internal Medicine

## 2012-02-12 ENCOUNTER — Encounter (INDEPENDENT_AMBULATORY_CARE_PROVIDER_SITE_OTHER): Payer: Self-pay

## 2012-02-28 ENCOUNTER — Encounter (INDEPENDENT_AMBULATORY_CARE_PROVIDER_SITE_OTHER): Payer: Self-pay

## 2012-03-12 ENCOUNTER — Telehealth (INDEPENDENT_AMBULATORY_CARE_PROVIDER_SITE_OTHER): Payer: Self-pay | Admitting: General Surgery

## 2012-03-12 NOTE — Telephone Encounter (Signed)
Spoke with patient, aware 04/01/12 is the soonest available with Dr Michaell Cowing. She wanted to move up because she was having pain in her hip. I advised this is probably not related. She will contact her medical MD re: hip pain and keep appt on 04/01/12 with Dr Michaell Cowing.

## 2012-03-12 NOTE — Telephone Encounter (Signed)
Message copied by Liliana Cline on Tue Mar 12, 2012 12:55 PM ------      Message from: Rise Paganini      Created: Tue Mar 12, 2012 11:22 AM      Regarding: Dr. Michaell Cowing      Contact: [phone]       Patient would like to be seen sooner by Dr. Michaell Cowing or another physician for her hems. Please call her sister Corrie Dandy at [phone]. Thank you.

## 2012-03-18 ENCOUNTER — Telehealth (INDEPENDENT_AMBULATORY_CARE_PROVIDER_SITE_OTHER): Payer: Self-pay

## 2012-03-18 NOTE — Telephone Encounter (Signed)
Patient's POA called and states the pt is having some discomfort from her hemorrhoids and would like to be seen sooner.  There is no bleeding.  Please call if you can work in sooner with Dr Michaell Cowing.

## 2012-04-01 ENCOUNTER — Encounter (INDEPENDENT_AMBULATORY_CARE_PROVIDER_SITE_OTHER): Payer: Self-pay | Admitting: Surgery

## 2012-04-01 ENCOUNTER — Ambulatory Visit (INDEPENDENT_AMBULATORY_CARE_PROVIDER_SITE_OTHER): Payer: Medicare Other | Admitting: Surgery

## 2012-04-01 VITALS — BP 162/71 | HR 73 | Temp 98.2°F | Ht 63.0 in | Wt 107.6 lb

## 2012-04-01 DIAGNOSIS — K5900 Constipation, unspecified: Secondary | ICD-10-CM

## 2012-04-01 DIAGNOSIS — K648 Other hemorrhoids: Secondary | ICD-10-CM

## 2012-04-01 DIAGNOSIS — K5909 Other constipation: Secondary | ICD-10-CM

## 2012-04-01 MED ORDER — HYDROCORTISONE ACE-PRAMOXINE 2.5-1 % RE CREA: TOPICAL_CREAM | Freq: Four times a day (QID) | RECTAL | Status: AC

## 2012-04-01 NOTE — Progress Notes (Signed)
Subjective:     Patient ID: Tammy Myers, female   DOB: 07-17-1921, 78 y.o.   MRN: [id number]  HPI   [facility]  [birth year] [id number]  Patient Care Team: [name], MD as PCP - General (Internal Medicine) Theda Belfast, MD as Consulting Physician (Gastroenterology) Herby Abraham, MD as Consulting Physician (Cardiology) Robley Fries, MD as Attending Physician (Obstetrics and Gynecology) Man Nedra Hai, OTR as Nurse Practitioner (Nurse Practitioner)  This patient is a 78 y.o.female who presents today for surgical evaluation.   Reason for visit: Hemorrhoid flares.  The patient comes today with her sister.  She is eating relatively well.  Walking independently now.  Appetite is fair but has always been that way. Claims to have a bowel movement every day. Some flatus.  Recently fell  Some diffuse mild abdominal discomfort. No nausea or vomiting.   She notes that she is worried about a hemorrhoid.  I recommended consider banding.  She declined.  She wanted to be checked again.  Her sister is more convinced something needs to be done.  Patient denies any bleeding.  Occasionally uses suppository.  About one to two weeks ago she had some irritation from it.  It is gone now.  Patient Active Problem List  Diagnosis  . HYPERTENSION  . ATRIAL FIBRILLATION, PAROXYSMAL  . OSTEOARTHRITIS  . MURMUR  . Constipation, chronic  . Internal hemorrhoids with prolapse  . Anemia  . Esophagitis, erosive  . Abdominal pain  . elevated alkaline phosphatase  . Nonspecific elevation of levels of transaminase or lactic acid dehydrogenase (LDH)  . Chronically dilated bile duct  . Ascites  . Chronic cholecystitis with calculus    Past Medical History  Diagnosis Date  . PMR (polymyalgia rheumatica)   . Temporal arteritis   . Osteoporosis   . Hearing loss   . Wears glasses   . Rectal bleeding   . Arthritis   . Rectal prolapse   . Hypertension   . Mitral valve prolapse   .  Spinal stenosis   . Alzheimer's dementia   . Hyperlipidemia   . GERD (gastroesophageal reflux disease)   . Sciatica   . Atrial fibrillation     Past Surgical History  Procedure Date  . Abdominal hysterectomy   . Appendectomy   . Colonoscopy   . Breast surgery   . Hernia repair     LIH  . Esophagogastroduodenoscopy 11/02/2011    Procedure: ESOPHAGOGASTRODUODENOSCOPY (EGD);  Surgeon: Yancey Flemings, MD;  Location: [facility] ENDOSCOPY;  Service: Endoscopy;  Laterality: N/A;  . Rectal prolapse repair, rectopexy 11/16/2010    with sigmoid colectomy.  Repair w biologic mesh in pelvis  . Cholecystectomy 12/12/11    History   Social History  . Marital Status: Widowed    Spouse Name: N/A    Number of Children: N/A  . Years of Education: N/A   Occupational History  . Not on file.   Social History Main Topics  . Smoking status: Never Smoker   . Smokeless tobacco: Not on file  . Alcohol Use: No  . Drug Use: No  . Sexually Active:    Other Topics Concern  . Not on file   Social History Narrative  . No narrative on file    Family History  Problem Relation Age of Onset  . Cancer Brother     bladder    Current outpatient prescriptions:aspirin EC 81 MG tablet, Take 1 tablet (81 mg total) by mouth daily.,  Disp: , Rfl: ;  Calcium Carbonate-Vitamin D (CALCIUM + D PO), Take 750 mg by mouth 2 (two) times daily.  , Disp: , Rfl: ;  docusate sodium (COLACE) 100 MG capsule, Take 100 mg by mouth 2 (two) times daily.  , Disp: , Rfl: ;  donepezil (ARICEPT) 5 MG tablet, Take 5 mg by mouth at bedtime as needed. For anxiety, Disp: , Rfl:  ESTRACE VAGINAL 0.1 MG/GM vaginal cream, Once a week., Disp: , Rfl: ;  fentaNYL (DURAGESIC - DOSED MCG/HR) 25 MCG/HR, Place 1 patch onto the skin every 3 (three) days., Disp: , Rfl: ;  ferrous sulfate 325 (65 FE) MG tablet, Take 325 mg by mouth daily with breakfast., Disp: , Rfl: ;  HYDROcodone-acetaminophen (VICODIN) 5-500 MG per tablet, Take 1 tablet by mouth every 6 (six)  hours as needed. For pain, Disp: , Rfl:  Multiple Vitamin (MULITIVITAMIN WITH MINERALS) TABS, Take 1 tablet by mouth daily., Disp: , Rfl: ;  pantoprazole (PROTONIX) 40 MG tablet, Take 1 tablet (40 mg total) by mouth 2 (two) times daily., Disp: 60 tablet, Rfl: 3;  polyethylene glycol powder (GLYCOLAX/MIRALAX) powder, , Disp: , Rfl: ;  predniSONE (DELTASONE) 5 MG tablet, Take 5 mg by mouth daily.  , Disp: , Rfl:   Allergies  Allergen Reactions  . Alendronate Sodium     BP 162/71  Pulse 73  Temp 98.2 F (36.8 C) (Temporal)  Ht 5\' 3"  (1.6 m)  Wt 107 lb 9.6 oz (48.807 kg)  BMI 19.06 kg/m2  SpO2 97%     Review of Systems  Constitutional: Positive for fatigue. Negative for fever, chills, diaphoresis and unexpected weight change.  HENT: Negative for ear pain, sore throat and trouble swallowing.   Eyes: Negative for photophobia and visual disturbance.  Respiratory: Negative for cough and choking.   Cardiovascular: Negative for chest pain and palpitations.  Gastrointestinal: Positive for abdominal pain. Negative for nausea, vomiting, diarrhea, blood in stool, anal bleeding and rectal pain.  Genitourinary: Negative for dysuria, frequency and difficulty urinating.  Musculoskeletal: Negative for myalgias and gait problem.  Skin: Negative for color change, pallor and rash.  Neurological: Negative for dizziness, syncope, speech difficulty, weakness, numbness and headaches.  Hematological: Negative for adenopathy.  Psychiatric/Behavioral: Negative for confusion and agitation. The patient is not nervous/anxious.        Objective:   Physical Exam  Constitutional: She is oriented to person, place, and time. She appears well-developed and well-nourished. No distress.  HENT:  Head: Normocephalic.  Mouth/Throat: Oropharynx is clear and moist. No oropharyngeal exudate.  Eyes: Conjunctivae and EOM are normal. Pupils are equal, round, and reactive to light. No scleral icterus.  Neck: Normal range  of motion. No tracheal deviation present.  Cardiovascular: Normal rate and intact distal pulses.   Pulmonary/Chest: Effort normal. No respiratory distress. She exhibits no tenderness.  Abdominal: Soft. Hernia confirmed negative in the right inguinal area and confirmed negative in the left inguinal area.       Incisions clean with normal healing ridges.  No hernias.  Mildly distended.  Mild discomfort periumbilical.   No peritonitis.  Genitourinary: No vaginal discharge found.       Perianal skin clean with good hygiene.  No pruritis.  No pilonidal disease.  No fissure.  No abscess/fistula.    Tolerates digital and anoscopic rectal exam.  Normal sphincter tone.  No rectal masses.  Hemorrhoidal piles WNL except R posterior mild/mod prolapse, no bleeding/pain - refuses banding / injection AGAIN  Musculoskeletal: Normal range of motion. She exhibits no tenderness.       Severe kyphosis unchanged  Lymphadenopathy:       Right: No inguinal adenopathy present.       Left: No inguinal adenopathy present.  Neurological: She is alert and oriented to person, place, and time. No cranial nerve deficit. She exhibits normal muscle tone. Coordination normal.  Skin: Skin is warm and dry. No rash noted. She is not diaphoretic.  Psychiatric: She has a normal mood and affect. Her behavior is normal.       Assessment:     Mod prolapsing hemorrhoid - declines Tx    Plan:     The anatomy & physiology of the anorectal region was discussed.  The pathophysiology of hemorrhoids and differential diagnosis was discussed.  Natural history progression  was discussed.   I stressed the importance of a bowel regimen to have daily soft bowel movements to minimize progression of disease.   Goal of one BM / day ideal.  Use of wet wipes, warm baths, avoiding straining, etc were emphasized.  I think she would benefit from banding. It is easily prolapsing.  It is low so she may be too sensitive to it but thought it might  help.  She declines again.  Educational handouts further explaining the pathology, treatment options, and bowel regimen were given as well.   The patient expressed understanding.

## 2012-04-01 NOTE — Patient Instructions (Signed)

## 2012-06-03 ENCOUNTER — Other Ambulatory Visit: Payer: Self-pay | Admitting: Internal Medicine

## 2012-07-11 ENCOUNTER — Encounter (HOSPITAL_COMMUNITY): Payer: Self-pay | Admitting: Emergency Medicine

## 2012-07-11 ENCOUNTER — Emergency Department (HOSPITAL_COMMUNITY)
Admission: EM | Admit: 2012-07-11 | Discharge: 2012-07-11 | Disposition: A | Discharge status: Home / Self Care | Payer: Medicare Other | Attending: Emergency Medicine | Admitting: Emergency Medicine

## 2012-07-11 DIAGNOSIS — Z7982 Long term (current) use of aspirin: Secondary | ICD-10-CM | POA: Insufficient documentation

## 2012-07-11 DIAGNOSIS — I10 Essential (primary) hypertension: Secondary | ICD-10-CM | POA: Insufficient documentation

## 2012-07-11 DIAGNOSIS — M353 Polymyalgia rheumatica: Secondary | ICD-10-CM | POA: Insufficient documentation

## 2012-07-11 DIAGNOSIS — I059 Rheumatic mitral valve disease, unspecified: Secondary | ICD-10-CM | POA: Insufficient documentation

## 2012-07-11 DIAGNOSIS — K625 Hemorrhage of anus and rectum: Secondary | ICD-10-CM | POA: Insufficient documentation

## 2012-07-11 DIAGNOSIS — F0280 Dementia in other diseases classified elsewhere without behavioral disturbance: Secondary | ICD-10-CM | POA: Insufficient documentation

## 2012-07-11 DIAGNOSIS — K649 Unspecified hemorrhoids: Secondary | ICD-10-CM | POA: Insufficient documentation

## 2012-07-11 DIAGNOSIS — M810 Age-related osteoporosis without current pathological fracture: Secondary | ICD-10-CM | POA: Insufficient documentation

## 2012-07-11 DIAGNOSIS — I4891 Unspecified atrial fibrillation: Secondary | ICD-10-CM | POA: Insufficient documentation

## 2012-07-11 DIAGNOSIS — G309 Alzheimer's disease, unspecified: Secondary | ICD-10-CM | POA: Insufficient documentation

## 2012-07-11 DIAGNOSIS — E785 Hyperlipidemia, unspecified: Secondary | ICD-10-CM | POA: Insufficient documentation

## 2012-07-11 LAB — CBC
Hemoglobin: 13.2 g/dL (ref 12.0–15.0)
MCHC: 33.8 g/dL (ref 30.0–36.0)
RDW: 13.9 % (ref 11.5–15.5)
WBC: 5.4 10*3/uL (ref 4.0–10.5)

## 2012-07-11 MED ORDER — TUCKS 50 % EX PADS: 1.0000 "application " | MEDICATED_PAD | Freq: Four times a day (QID) | CUTANEOUS | Status: DC | PRN

## 2012-07-11 MED ORDER — PRAMOXINE-ZINC OXIDE IN MO 1-12.5 % RE OINT
TOPICAL_OINTMENT | Freq: Three times a day (TID) | RECTAL | Status: DC | PRN
  Administered 2012-07-11: 11:00:00 via RECTAL
  Filled 2012-07-11 (×2): qty 28.3

## 2012-07-11 NOTE — ED Provider Notes (Signed)
History     CSN: [id number]  Arrival date & time 07/11/12  0902   First MD Initiated Contact with Patient 07/11/12 [phone]      Chief Complaint  Patient presents with  . Rectal Bleeding  . Hemorrhoids    (Consider location/radiation/quality/duration/timing/severity/associated sxs/prior treatment) HPI The patient presents with concerns of perirectal bleeding and pain.  Symptoms began overnight, approximately 9 hours ago after a bowel movement.  Since that time the patient has had mild bleeding from her rectal area with associated burning pain.  She denies abdominal pain, fever, vomiting, diarrhea, nausea. The patient has history of dementia, but according to her and her family members she has been in her usual state of health until today.   Past Medical History  Diagnosis Date  . PMR (polymyalgia rheumatica)   . Temporal arteritis   . Osteoporosis   . Hearing loss   . Wears glasses   . Rectal bleeding   . Arthritis   . Rectal prolapse   . Hypertension   . Mitral valve prolapse   . Spinal stenosis   . Alzheimer's dementia   . Hyperlipidemia   . GERD (gastroesophageal reflux disease)   . Sciatica   . Atrial fibrillation     Past Surgical History  Procedure Date  . Abdominal hysterectomy   . Appendectomy   . Colonoscopy   . Breast surgery   . Hernia repair     LIH  . Esophagogastroduodenoscopy 11/02/2011    Procedure: ESOPHAGOGASTRODUODENOSCOPY (EGD);  Surgeon: Yancey Flemings, MD;  Location: [facility] ENDOSCOPY;  Service: Endoscopy;  Laterality: N/A;  . Rectal prolapse repair, rectopexy 11/16/2010    with sigmoid colectomy.  Repair w biologic mesh in pelvis  . Cholecystectomy 12/12/11    Family History  Problem Relation Age of Onset  . Cancer Brother     bladder    History  Substance Use Topics  . Smoking status: Never Smoker   . Smokeless tobacco: Not on file  . Alcohol Use: No    OB History    Grav Para Term Preterm Abortions TAB SAB Ect Mult Living                   Review of Systems  All other systems reviewed and are negative.    Allergies  Alendronate sodium  Home Medications   Current Outpatient Rx  Name Route Sig Dispense Refill  . ASPIRIN EC 81 MG PO TBEC Oral Take 1 tablet (81 mg total) by mouth daily.    [name] CALCIUM + D PO Oral Take 750 mg by mouth 2 (two) times daily.      [name] DOCUSATE SODIUM 100 MG PO CAPS Oral Take 100 mg by mouth 2 (two) times daily.      . DONEPEZIL HCL 5 MG PO TABS Oral Take 5 mg by mouth at bedtime as needed. For anxiety    . ESTRACE 0.1 MG/GM VA CREA  Once a week.    . FENTANYL 25 MCG/HR TD PT72 Transdermal Place 1 patch onto the skin every 3 (three) days.    Di Kindle SULFATE 325 (65 FE) MG PO TABS Oral Take 325 mg by mouth daily with breakfast.    . HYDROCODONE-ACETAMINOPHEN 5-500 MG PO TABS Oral Take 1 tablet by mouth every 6 (six) hours as needed. For pain    . ADULT MULTIVITAMIN W/MINERALS CH Oral Take 1 tablet by mouth daily.    [name] PANTOPRAZOLE SODIUM 40 MG PO TBEC Oral Take 1 tablet (  40 mg total) by mouth 2 (two) times daily. 60 tablet 3  . POLYETHYLENE GLYCOL 3350 PO POWD      . PREDNISONE 5 MG PO TABS Oral Take 5 mg by mouth daily.        BP 139/69  Temp 98.7 F (37.1 C) (Oral)  Resp 24  SpO2 97%  Physical Exam  Nursing note and vitals reviewed. Constitutional: She is oriented to person, place, and time. She appears well-developed and well-nourished. No distress.  HENT:  Head: Normocephalic and atraumatic.  Eyes: Conjunctivae normal and EOM are normal.  Cardiovascular: Normal rate and regular rhythm.   Murmur heard. Pulmonary/Chest: Effort normal and breath sounds normal. No stridor. No respiratory distress.  Abdominal: She exhibits no distension.  Genitourinary:       There is a midline anterior hemorrhoid, but is non-edematous, not bleeding.  Lateral to the hemorrhoid there is an abraded area of perirectal tissue with trace active bleeding.  Musculoskeletal: She exhibits no edema.   Neurological: She is alert and oriented to person, place, and time. No cranial nerve deficit.  Skin: Skin is warm and dry.  Psychiatric: She has a normal mood and affect.    ED Course  Procedures (including critical care time)   Labs Reviewed  CBC   No results found.   No diagnosis found.    MDM  This generally well-appearing elderly female with dementia now presents with concerns of ongoing perirectal bleeding.  On exam there is abraded perirectal tissue, no bleeding hemorrhoids.  The patient's lab values were unremarkable, her vital signs stable, and following topical treatment she was discharged in stable condition with PMD followup.    Gerhard Munch, MD 07/11/12 1019

## 2012-07-11 NOTE — ED Notes (Signed)
Awaiting Tucks from pharmacy

## 2012-07-11 NOTE — ED Notes (Signed)
Started bleeding about midnight, when going to the bathroom-- lives at [facility] in independent living. Sister and brother-in -law brought to ED on suggestion from nurse

## 2012-08-01 ENCOUNTER — Other Ambulatory Visit: Payer: Self-pay | Admitting: Nurse Practitioner

## 2012-08-01 ENCOUNTER — Other Ambulatory Visit: Payer: Self-pay | Admitting: Internal Medicine

## 2012-08-01 DIAGNOSIS — R109 Unspecified abdominal pain: Secondary | ICD-10-CM

## 2012-08-07 ENCOUNTER — Ambulatory Visit
Admission: RE | Admit: 2012-08-07 | Discharge: 2012-08-07 | Disposition: A | Discharge status: Home / Self Care | Payer: Medicare Other | Source: Ambulatory Visit | Attending: Internal Medicine | Admitting: Internal Medicine

## 2012-08-07 DIAGNOSIS — R109 Unspecified abdominal pain: Secondary | ICD-10-CM

## 2012-08-09 ENCOUNTER — Ambulatory Visit
Admission: RE | Admit: 2012-08-09 | Discharge: 2012-08-09 | Disposition: A | Discharge status: Home / Self Care | Payer: Medicare Other | Source: Ambulatory Visit | Attending: Internal Medicine | Admitting: Internal Medicine

## 2012-08-09 MED ORDER — GADOBENATE DIMEGLUMINE 529 MG/ML IV SOLN: 9.0000 mL | Freq: Once | INTRAVENOUS | Status: AC | PRN

## 2012-08-15 ENCOUNTER — Telehealth: Payer: Self-pay | Admitting: Internal Medicine

## 2012-08-15 NOTE — Telephone Encounter (Signed)
I reviewed the MRI. What is the question? What is her clinical problem? If that is the dilated bile duct, she has had that for at least 8 months as noted on prior ultrasound. As well, she has had chronically elevated alkaline phosphatase. As well, she is 37 with probable cirrhosis and dementia. Were does she reside?. Please get me more clinical information and pertinent studies. We can go from there. Certainly, if she is acutely ill, then he may have her hospitalized to get a GI consult if appropriate. Thanks

## 2012-08-15 NOTE — Telephone Encounter (Signed)
Dr. Marina Goodell ,  Dr. Neva Seat is asking for you to see this patient tomorrow.  Please see MRI in her chart.  Please advise your schedule is blocked for tomorrow

## 2012-08-16 NOTE — Telephone Encounter (Signed)
I spoke with Debbie.  The patient has persistent abdominal pain, elevated LFTs , and the abnormal MRI.  I will have her scheduled to see Willette Cluster RNP on 08/20/12 10:30.  Debbie with Dr. Chilton Si will fax additional records

## 2012-08-20 ENCOUNTER — Encounter: Payer: Self-pay | Admitting: Nurse Practitioner

## 2012-08-20 ENCOUNTER — Ambulatory Visit (INDEPENDENT_AMBULATORY_CARE_PROVIDER_SITE_OTHER): Payer: Medicare Other | Admitting: Nurse Practitioner

## 2012-08-20 VITALS — BP 130/84 | HR 88 | Ht 63.0 in | Wt 110.4 lb

## 2012-08-20 DIAGNOSIS — R7989 Other specified abnormal findings of blood chemistry: Secondary | ICD-10-CM

## 2012-08-20 DIAGNOSIS — R945 Abnormal results of liver function studies: Secondary | ICD-10-CM

## 2012-08-20 DIAGNOSIS — R932 Abnormal findings on diagnostic imaging of liver and biliary tract: Secondary | ICD-10-CM

## 2012-08-20 NOTE — Progress Notes (Signed)
08/20/2012 JACKI COUSE [id number] [birth year]   History of Present Illness:  Patient is an 78 year old female seen by [place] in the hospital in January of this year for hematemesis.Upper endoscopy at that time revealed severe esophagitis and an esophageal stricture. We saw her again in the hospital in March of this year for evaluation of right upper quadrant pain and elevated LFTs , predominantly cholestatic with alkaline phosphatase in the 500 range. Of note, her her antimitochondrial antibody was normal. ANA was positive, speckled pattern, titer 1:80. HIDA scan positive for cystic duct obstruction, patient underwent laparoscopic cholecystectomy. She has not been seen by [place] since that hospitalization.   Patient referred by [facility] senior care for evaluation of abdominal pain and abnormal abdominal imaging. Patient was seen at [facility] 08/15/12 at which time she described chronic periumbilical pain. MRI of the abdomen with and without contrast done a few days prior to her appointment at [facility] revealed a cirrhotic appearing liver, common bile duct dilation up to 15mm tapering abruptly at the ampulla. There was a 9 x 7mm non-aggressive cyst in the pancreatic body. Normal spleen. Small volume perihepatic/perisplenic ascites.   Patient complains of chronic lower abdominal pain. She has two family members here today and they are somewhat concerned about her daily use of pain medication for lower abdominal pain. Patient denies constipation, diarrhea, blood in her stool. She denies weight loss. Appetite is satisfactory. Her PCP note, hemoglobin on 07/11/12 with 13.2.  Current Medications, Allergies, Past Medical History, Past Surgical History, Family History and Social History were reviewed in [employer] record.   Physical Exam: General: Pleasant, elderly white female in no acute distress Head: Normocephalic and atraumatic Eyes:  sclerae anicteric,  conjunctiva pink  Lungs: Clear throughout to auscultation Heart: Regular rate and rhythm Abdomen: Soft, non tender and non distended. No masses, no hepatomegaly. Normal bowel sounds Musculoskeletal: Symmetrical with no gross deformities  Extremities: No edema  Neurological: Alert, grossly intact neurologically Psychological:  Alert and cooperative. Normal mood and affect  Assessment and Recommendations: 67.  78 year old female with chronic bile duct dilation and a chronically elevated alkaline phosphatase level in the 500 range. Her AMA in March was normal.   Patient, family, and I discussed MRI findings. While etiology of MRI findings is unknown (malignancy not excluded), patient denies denies upper abdominal pain, nausea or weight loss. Patient looks and feels okay except for chronic lower abdominal pain. Would not recommend ERCP given her advanced age and stable findings on MRI.[name]  2. Chronic lower abdominal pain. Patient denies constipation though abdominal imaging studies in the past have suggested constipation. She denies rectal bleeding, weight loss, or other alarm factors. Patient does take pain medications. We discussed associated potential for constipation with narcotics.

## 2012-08-22 DIAGNOSIS — R7989 Other specified abnormal findings of blood chemistry: Secondary | ICD-10-CM | POA: Insufficient documentation

## 2012-08-22 DIAGNOSIS — R945 Abnormal results of liver function studies: Secondary | ICD-10-CM | POA: Insufficient documentation

## 2012-08-23 NOTE — Progress Notes (Signed)
Agree with assessment. 

## 2012-09-26 ENCOUNTER — Ambulatory Visit (INDEPENDENT_AMBULATORY_CARE_PROVIDER_SITE_OTHER): Payer: Medicare Other | Admitting: Family Medicine

## 2012-09-26 ENCOUNTER — Encounter: Payer: Self-pay | Admitting: Family Medicine

## 2012-09-26 ENCOUNTER — Ambulatory Visit: Payer: Medicare Other

## 2012-09-26 VITALS — BP 170/67 | HR 83 | Temp 98.0°F | Resp 16 | Ht 63.0 in | Wt 113.0 lb

## 2012-09-26 DIAGNOSIS — S42033A Displaced fracture of lateral end of unspecified clavicle, initial encounter for closed fracture: Secondary | ICD-10-CM

## 2012-09-26 DIAGNOSIS — M25519 Pain in unspecified shoulder: Secondary | ICD-10-CM

## 2012-09-26 MED ORDER — OXYCODONE-ACETAMINOPHEN 5-325 MG PO TABS: 1.0000 | ORAL_TABLET | ORAL | Status: DC | PRN

## 2012-09-26 NOTE — Progress Notes (Signed)
Subjective:    Patient ID: Tammy Myers, female    DOB: [birth year], 78 y.o.   MRN: [id number]  HPI  Tammy Myers is a pleasant 78 yo woman who lives at [facility] and is brought in by her twin sister and brother-in-law who relate the story as Tammy Myers has some moderate dementia. Last night she got out of bed and fell onto floor and hit right shoulder on floor - not sure if hit something else as well but did not think that she hit her head.  In a lot pain with moving her arm at all and the bruising today has continued to spread and worsen.  Past Medical History  Diagnosis Date  . PMR (polymyalgia rheumatica)   . Temporal arteritis   . Osteoporosis   . Hearing loss   . Wears glasses   . Rectal bleeding   . Arthritis   . Rectal prolapse   . Hypertension   . Mitral valve prolapse   . Spinal stenosis   . Alzheimer's dementia   . Hyperlipidemia   . GERD (gastroesophageal reflux disease)   . Sciatica   . Atrial fibrillation    Current Outpatient Prescriptions on File Prior to Visit  Medication Sig Dispense Refill  . aspirin EC 81 MG tablet Take 1 tablet (81 mg total) by mouth daily.      [name] docusate sodium (COLACE) 100 MG capsule Take 100 mg by mouth 2 (two) times daily.        [name] donepezil (ARICEPT) 5 MG tablet Take 5 mg by mouth at bedtime as needed. For anxiety      . HYDROcodone-acetaminophen (VICODIN) 5-500 MG per tablet Take 1 tablet by mouth every 6 (six) hours as needed. For pain      . predniSONE (DELTASONE) 5 MG tablet Take 5 mg by mouth daily.        . Calcium Carbonate-Vitamin D (CALCIUM + D PO) Take 750 mg by mouth 2 (two) times daily.        [name] ESTRACE VAGINAL 0.1 MG/GM vaginal cream Once a week.      . fentaNYL (DURAGESIC - DOSED MCG/HR) 25 MCG/HR Place 1 patch onto the skin every 3 (three) days.      . pantoprazole (PROTONIX) 40 MG tablet Take 1 tablet (40 mg total) by mouth 2 (two) times daily.  60 tablet  3  . polyethylene glycol powder (GLYCOLAX/MIRALAX)  powder       . Witch Hazel (TUCKS) 50 % PADS Apply 1 application topically every 6 (six) hours as needed.  30 each  0     Review of Systems      BP 170/67  Pulse 83  Temp 98 F (36.7 C) (Oral)  Resp 16  Ht 5\' 3"  (1.6 m)  Wt 113 lb (51.256 kg)  BMI 20.02 kg/m2 Objective:   Physical Exam  Constitutional: She is oriented to person, place, and time. She appears well-developed and well-nourished. No distress.  HENT:  Head: Normocephalic and atraumatic.  Right Ear: External ear normal.  Eyes: Conjunctivae normal are normal. No scleral icterus.  Cardiovascular: Normal rate and regular rhythm.   Pulses:      Radial pulses are 2+ on the right side, and 2+ on the left side.  Pulmonary/Chest: Effort normal and breath sounds normal. No respiratory distress.  Musculoskeletal:       Right shoulder: She exhibits decreased range of motion, tenderness, bony tenderness, deformity, pain and decreased strength.  Extensive bruising over apex and anterior right shoulder extending from acromion to neck with significant tenderness to palpation over distal clavicle and AC joint. Cannot abduct arm >90 degrees passive or active.  Neurological: She is alert and oriented to person, place, and time.  Skin: Skin is warm and dry. She is not diaphoretic. No erythema.  Psychiatric: She has a normal mood and affect. Her behavior is normal.         [facility] reading (PRIMARY) by  Dr. Clelia Croft. Displaced distal 3rd clavicle fracture with elevation of proximal end and depression of AC joint end.  Assessment & Plan:  Distal displaced clavicular fracture - STAT ortho referral - pt needs to be seen tomorrow. Will ask referrals to call her brother-in-law on cell phone [phone] with urgent referral to ortho surgery next year.  Pt does have some dementia.  Placed in sling and advised to remain in place at all times, do not use arm.  To ER if any break in skin occurs.  Stop chronic prn vicodin and try prn oxycodone for pain  control.  Do not wear bra.

## 2012-10-24 ENCOUNTER — Encounter (INDEPENDENT_AMBULATORY_CARE_PROVIDER_SITE_OTHER): Payer: Self-pay | Admitting: General Surgery

## 2012-10-24 ENCOUNTER — Ambulatory Visit (INDEPENDENT_AMBULATORY_CARE_PROVIDER_SITE_OTHER): Payer: Medicare Other | Admitting: General Surgery

## 2012-10-24 VITALS — BP 110/68 | HR 76 | Temp 98.6°F | Resp 16 | Ht 66.0 in | Wt 110.2 lb

## 2012-10-24 DIAGNOSIS — K648 Other hemorrhoids: Secondary | ICD-10-CM

## 2012-10-24 MED ORDER — HYDROCORTISONE 2.5 % RE CREA: TOPICAL_CREAM | Freq: Four times a day (QID) | RECTAL | Status: DC

## 2012-10-24 NOTE — Progress Notes (Signed)
Subjective:     Patient ID: Tammy Myers, female   DOB: [birth year], 79 y.o.   MRN: [id number]  HPI  She is well known to the practice because of problems with intermittently painful thrombosed internal hemorrhoids. She has been having some discomfort and lobe of the bleeding recently and presents for evaluation.   Review of Systems     Objective:   Physical Exam Anorectal-There is a prolapsed tender internal hemorrhoid of moderate size in the right posterior position. There is some perianal excoriation present. She did not tolerate an attempt at rubber band ligation.    Assessment:     Prolapsed right posterior internal hemorrhoid.    Plan:     Anusol HC 4 times a day. Witch hazel 4 times a day. Return visit as needed.

## 2012-10-24 NOTE — Patient Instructions (Signed)
Apply hemorrhoid cream to the area 4 times a day. Use witch hazel on area twice a day.

## 2012-11-04 ENCOUNTER — Telehealth (INDEPENDENT_AMBULATORY_CARE_PROVIDER_SITE_OTHER): Payer: Self-pay

## 2012-11-04 NOTE — Telephone Encounter (Signed)
Tammy Myers called for the pt.  She was seen for hemorrhoids by Dr Abbey Chatters.  She tried the advice he recommended.  She is still having pain and wants to be seen today.  Please call if you can work that out.

## 2012-11-04 NOTE — Telephone Encounter (Signed)
Corey Harold called back after speaking to the daughter and pt.  She said she can do Wednesday.  The pt did not realize who called earlier and from where.  I switched the appointment back to 1/29 at 2:10

## 2012-11-04 NOTE — Telephone Encounter (Signed)
I called and gave the new appointment to [facility] for 11/07/12 at 2:10 and asked her to arrive at 1:45

## 2012-11-06 ENCOUNTER — Encounter (INDEPENDENT_AMBULATORY_CARE_PROVIDER_SITE_OTHER): Payer: Medicare Other | Admitting: General Surgery

## 2012-11-06 ENCOUNTER — Ambulatory Visit (INDEPENDENT_AMBULATORY_CARE_PROVIDER_SITE_OTHER): Payer: Medicare Other | Admitting: General Surgery

## 2012-11-06 ENCOUNTER — Encounter (INDEPENDENT_AMBULATORY_CARE_PROVIDER_SITE_OTHER): Payer: Self-pay | Admitting: General Surgery

## 2012-11-06 VITALS — BP 120/82 | HR 72 | Resp 18 | Wt 114.5 lb

## 2012-11-06 DIAGNOSIS — K648 Other hemorrhoids: Secondary | ICD-10-CM

## 2012-11-06 MED ORDER — HYDROCORTISONE 2.5 % RE CREA: TOPICAL_CREAM | Freq: Four times a day (QID) | RECTAL | Status: DC

## 2012-11-06 MED ORDER — MESALAMINE 1000 MG RE SUPP: 1000.0000 mg | Freq: Every day | RECTAL | Status: DC

## 2012-11-06 NOTE — Patient Instructions (Addendum)

## 2012-11-06 NOTE — Progress Notes (Signed)
Chief Complaint  Patient presents with  . Hemorrhoids    HISTORY: Tammy Myers is a 79 y.o. female who presents to the office with rectal pain.  Other symptoms include erythema.  This had been occurring for for several months.  She has tried anusol and witch hazel in the past with some success. her bowel habits are regular and her bowel movements are soft.  Her fiber intake is minimal. On her last visit here she was founding to have thrombosed prolapsing tissue.     Past Medical History  Diagnosis Date  . PMR (polymyalgia rheumatica)   . Temporal arteritis   . Osteoporosis   . Hearing loss   . Wears glasses   . Rectal bleeding   . Arthritis   . Rectal prolapse   . Hypertension   . Mitral valve prolapse   . Spinal stenosis   . Alzheimer's dementia   . Hyperlipidemia   . GERD (gastroesophageal reflux disease)   . Sciatica   . Atrial fibrillation       Past Surgical History  Procedure Date  . Abdominal hysterectomy   . Appendectomy   . Colonoscopy   . Breast surgery     left  . Hernia repair     LIH  . Esophagogastroduodenoscopy 11/02/2011    Procedure: ESOPHAGOGASTRODUODENOSCOPY (EGD);  Surgeon: Yancey Flemings, MD;  Location: [facility] ENDOSCOPY;  Service: Endoscopy;  Laterality: N/A;  . Rectal prolapse repair, rectopexy 11/16/2010    with sigmoid colectomy.  Repair w biologic mesh in pelvis  . Cholecystectomy 12/12/11        Current Outpatient Prescriptions  Medication Sig Dispense Refill  . Calcium Carbonate-Vitamin D (CALCIUM + D PO) Take 750 mg by mouth 2 (two) times daily.        [name] docusate sodium (COLACE) 100 MG capsule Take 100 mg by mouth 2 (two) times daily.        [name] donepezil (ARICEPT) 5 MG tablet Take 5 mg by mouth at bedtime as needed. For anxiety      . ESTRACE VAGINAL 0.1 MG/GM vaginal cream Once a week.      . fentaNYL (DURAGESIC - DOSED MCG/HR) 25 MCG/HR Place 1 patch onto the skin every 3 (three) days.      [name] HYDROcodone-acetaminophen (VICODIN) 5-500 MG per  tablet Take 1 tablet by mouth every 6 (six) hours as needed. For pain      . hydrocortisone (ANUSOL-HC) 2.5 % rectal cream Place rectally 4 (four) times daily.  30 g  0  . pantoprazole (PROTONIX) 40 MG tablet Take 1 tablet (40 mg total) by mouth 2 (two) times daily.  60 tablet  3  . polyethylene glycol powder (GLYCOLAX/MIRALAX) powder       . predniSONE (DELTASONE) 5 MG tablet Take 5 mg by mouth daily.        Weyman Croon Hazel (TUCKS) 50 % PADS Apply 1 application topically every 6 (six) hours as needed.  30 each  0  . mesalamine (CANASA) 1000 MG suppository Place 1 suppository (1,000 mg total) rectally daily.  30 suppository  12      Allergies  Allergen Reactions  . Alendronate Sodium       Family History  Problem Relation Age of Onset  . Cancer Brother     bladder    History   Social History  . Marital Status: Widowed    Spouse Name: N/A    Number of Children: 0  . Years of Education: N/A  Occupational History  .     Social History Main Topics  . Smoking status: Never Smoker   . Smokeless tobacco: Never Used  . Alcohol Use: No  . Drug Use: No  . Sexually Active: None   Other Topics Concern  . None   Social History Narrative  . None      REVIEW OF SYSTEMS - PERTINENT POSITIVES ONLY: Review of Systems - General ROS: negative for - chills, fever or weight loss Hematological and Lymphatic ROS: negative for - bleeding problems, blood clots or bruising Respiratory ROS: no cough, shortness of breath, or wheezing Cardiovascular ROS: no chest pain or dyspnea on exertion Gastrointestinal ROS: no abdominal pain, change in bowel habits, or black or bloody stools Genito-Urinary ROS: no dysuria, trouble voiding, or hematuria  EXAM: Filed Vitals:   11/06/12 1407  BP: 120/82  Pulse: 72  Resp: 18    General appearance: alert and cooperative Resp: clear to auscultation bilaterally Cardio: regular rate and rhythm GI: soft, non-tender; bowel sounds normal; no masses,  no  organomegaly Scar suprapubically, RLQ scar  Procedure: Anoscopy Surgeon: Maisie Fus Diagnosis: rectal pain  Assistant: Christella Scheuermann After the risks and benefits were explained, verbal consent was obtained for above procedure  Anesthesia: none Findings: inflamed, enlarged internal hemorrhoids, LL was the largest, min external disease  ASSESSMENT AND PLAN: Tammy Myers is a 79 y.o. F who was recently seen with a thrombosed internal hemorrhoid.  It appears that her perineal inflammation is better but her internal inflammation is still present.  I have prescribed her canasa suppositories for this.  I will see her back in 2-3 weeks.     Vanita Panda, MD Colon and Rectal Surgery / General Surgery [facility]      Visit Diagnoses: 1. Inflamed internal hemorrhoid     Primary Care Physician: [name], MD

## 2012-11-07 ENCOUNTER — Encounter (INDEPENDENT_AMBULATORY_CARE_PROVIDER_SITE_OTHER): Payer: Medicare Other | Admitting: General Surgery

## 2012-11-19 ENCOUNTER — Encounter (INDEPENDENT_AMBULATORY_CARE_PROVIDER_SITE_OTHER): Payer: Self-pay | Admitting: General Surgery

## 2012-11-19 ENCOUNTER — Ambulatory Visit (INDEPENDENT_AMBULATORY_CARE_PROVIDER_SITE_OTHER): Payer: Medicare Other | Admitting: General Surgery

## 2012-11-19 VITALS — BP 138/82 | HR 66 | Temp 97.1°F | Resp 18 | Wt 114.0 lb

## 2012-11-19 DIAGNOSIS — K6289 Other specified diseases of anus and rectum: Secondary | ICD-10-CM

## 2012-11-19 DIAGNOSIS — K648 Other hemorrhoids: Secondary | ICD-10-CM

## 2012-11-19 MED ORDER — LIDOCAINE 5 % EX OINT: TOPICAL_OINTMENT | Freq: Three times a day (TID) | CUTANEOUS | Status: DC | PRN

## 2012-11-19 NOTE — Progress Notes (Signed)
Tammy Myers is a 79 y.o. female who is here for a follow up visit regarding her inflamed internal hemorrhoids.  She has been doing the canasa suppositories but still has rectal pain.  She denies any hard stools or major bleeding.    Objective: Filed Vitals:   11/19/12 1103  BP: 138/82  Pulse: 66  Temp: 97.1 F (36.2 C)  Resp: 18    General appearance: alert and cooperative Procedure: Anoscopy Surgeon: Maisie Fus Assistant: Christella Scheuermann After the risks and benefits were explained, verbal consent was obtained for above procedure  Anesthesia: none Diagnosis: anal pain Findings: inflamed anal canal with thrombosed internal hemorrhoids.  Mildly improved.     Assessment and Plan: Continue canasa suppositories.  Sitz baths TID.  Lidocaine ointment prescribed to help with pain.  RTO in 4 weeks.    Vanita Panda, MD [facility], [place] [phone]

## 2012-11-19 NOTE — Patient Instructions (Signed)
Continue canasa suppositories.  I have prescribed a lidocaine ointment to help with her pain.  I would also recommend getting a Sitz bath at a medical supply store and using this 2-3 times a day to help with her pain.

## 2012-12-09 ENCOUNTER — Telehealth (INDEPENDENT_AMBULATORY_CARE_PROVIDER_SITE_OTHER): Payer: Self-pay

## 2012-12-09 NOTE — Telephone Encounter (Signed)
Sounds good. Thank you

## 2012-12-09 NOTE — Telephone Encounter (Signed)
The patient's sister called to report at 4am this morning she had a hemorrhage from her hemorrhoids.  She got up to go to the bathroom.  They reported it to the nurse at the facility and she told her to drink prune juice and call to let us know.  I moved the patient's appointment up to tomorrow where I had an opening.

## 2012-12-10 ENCOUNTER — Ambulatory Visit (INDEPENDENT_AMBULATORY_CARE_PROVIDER_SITE_OTHER): Payer: Medicare Other | Admitting: General Surgery

## 2012-12-10 VITALS — BP 126/72 | HR 68 | Temp 98.0°F | Resp 18 | Ht 66.0 in | Wt 112.0 lb

## 2012-12-10 NOTE — Progress Notes (Signed)
Tammy Myers is a 79 y.o. female who is here for a follow up visit regarding her inflamed internal hemorrhoids. She has been having significant bleeding for the past 2 days.  She has been doing the canasa suppositories but still has rectal pain. The lidocaine cream doesn't seem to help.  She denies any hard stools.  Objective:  General appearance: alert and cooperative  Abd: soft  Procedure: Anoscopy and injection of sclerosing agent Surgeon: Maisie Fus  Assistant: Christella Scheuermann  After the risks and benefits were explained, verbal consent was obtained for above procedure  Anesthesia: none  Diagnosis: anal pain and bleeding Findings: inflammation improved, large R ant hemorrhoid with evidence of recent bleeding.  Injected with 3ml of almond oil    Assessment and Plan:  Continue canasa suppositories. Sitz baths TID. Try to keep perianal region dry.  RTO in 4 weeks.

## 2012-12-10 NOTE — Patient Instructions (Signed)
Make to use a fiber supplement daily.  Continue stool softeners.  Continue suppositories.  Continue warm baths 3 times a day.  Try to keep bottom dry after baths.

## 2012-12-13 ENCOUNTER — Ambulatory Visit (INDEPENDENT_AMBULATORY_CARE_PROVIDER_SITE_OTHER): Payer: Medicare Other | Admitting: General Surgery

## 2012-12-23 ENCOUNTER — Telehealth (INDEPENDENT_AMBULATORY_CARE_PROVIDER_SITE_OTHER): Payer: Self-pay | Admitting: General Surgery

## 2012-12-23 NOTE — Telephone Encounter (Signed)
Tammy Myers with [facility] called for patient stating she is still having a lot of rectal pain. She has been doing sitz baths, keeping area clean and dry and using suppositories. I made appt for patient to come in Wednesday for re-evaluation with Dr Maisie Fus.

## 2012-12-25 ENCOUNTER — Ambulatory Visit (INDEPENDENT_AMBULATORY_CARE_PROVIDER_SITE_OTHER): Payer: Medicare Other | Admitting: General Surgery

## 2012-12-25 ENCOUNTER — Encounter (INDEPENDENT_AMBULATORY_CARE_PROVIDER_SITE_OTHER): Payer: Self-pay | Admitting: General Surgery

## 2012-12-25 VITALS — BP 120/68 | HR 88 | Temp 98.3°F | Resp 18 | Ht 66.0 in | Wt 112.6 lb

## 2012-12-25 NOTE — Progress Notes (Signed)
Tammy Myers is a 79 y.o. female who is here for a follow up visit regarding her anal pain and bleeding internal hemorrhoids.  She is s/p sclerosis injection 2 weeks ago for bleeding. The bleeding is mostly resolved at this point, but she is still having anal pain.  She describes this as a constant ache.  She denies constipation or straining.  She is using canasa suppositories and sitz baths.  She has tried lidocaine cream in the past with no help.    Objective: Filed Vitals:   12/25/12 1033  BP: 120/68  Pulse: 88  Temp: 98.3 F (36.8 C)  Resp: 18    General appearance: alert and cooperative GI: soft, non-tender; bowel sounds normal; no masses,  no organomegaly  Procedure: Flexible Sigmoidoscopy Surgeon: Maisie Fus Assistant: Christella Scheuermann After the risks and benefits were explained, verbal consent was obtained for above procedure  Anesthesia: none Diagnosis: anal pain, rule out rectal inflammation or mass Procedure: flexible sigmoidoscope was advanced to the level of rectosigmoid junction without difficulty.  It was withdrawn slowly to evaluate mucosa.   Findings: inflamed internal hemorrhoids, posterior anal canal excoriation, Rectal mucosa grossly normal to level of rectosigmoid junction  Assessment and Plan: Tammy Myers is a 79 y.o. F with anal pain that I am currently treating.  This appears to be due to internal hemorrhoids.  They are less inflamed now but there is an area of excoriated skin in the posterior anal canal.  This is probably the area that is causing most of her irritation.  I think things are healing slowly.  This is probably due to her age.  I do not see any rectal mucosal changes to suggest ischemia or rectal masses.  She will continue her suppositories and sitz baths.  They are going to try some diaper rash cream to help with protecting the skin.  I will see her back in 2 weeks.    Vanita Panda, MD [facility], [place] [phone]

## 2012-12-25 NOTE — Patient Instructions (Signed)
Continue suppositories and warm baths.  Ok to try a cream like desitin (diaper rash cream) to help protect the area while it heals.

## 2013-01-03 ENCOUNTER — Telehealth (INDEPENDENT_AMBULATORY_CARE_PROVIDER_SITE_OTHER): Payer: Self-pay

## 2013-01-03 NOTE — Telephone Encounter (Signed)
I called to push the pt's appointment out 2 more weeks per Dr Maisie Fus.  Corrie Dandy was not in so I spoke to her husband.  He usually comes to the appointments with them.  I rescheduled the appointment for 4/16 at 11 am.

## 2013-01-08 ENCOUNTER — Ambulatory Visit (INDEPENDENT_AMBULATORY_CARE_PROVIDER_SITE_OTHER): Payer: Medicare Other | Admitting: General Surgery

## 2013-01-09 ENCOUNTER — Inpatient Hospital Stay (HOSPITAL_COMMUNITY)
Admission: EM | Admit: 2013-01-09 | Discharge: 2013-01-14 | DRG: 441 | Disposition: A | Discharge status: Skilled Nursing Facility | Payer: Medicare Other | Attending: Family Medicine | Admitting: Family Medicine

## 2013-01-09 ENCOUNTER — Emergency Department (HOSPITAL_COMMUNITY): Payer: Medicare Other

## 2013-01-09 ENCOUNTER — Encounter (HOSPITAL_COMMUNITY): Payer: Self-pay

## 2013-01-09 DIAGNOSIS — Z8719 Personal history of other diseases of the digestive system: Secondary | ICD-10-CM

## 2013-01-09 DIAGNOSIS — K449 Diaphragmatic hernia without obstruction or gangrene: Secondary | ICD-10-CM | POA: Diagnosis present

## 2013-01-09 DIAGNOSIS — I8511 Secondary esophageal varices with bleeding: Secondary | ICD-10-CM | POA: Diagnosis present

## 2013-01-09 DIAGNOSIS — M353 Polymyalgia rheumatica: Secondary | ICD-10-CM | POA: Diagnosis present

## 2013-01-09 DIAGNOSIS — K922 Gastrointestinal hemorrhage, unspecified: Secondary | ICD-10-CM

## 2013-01-09 DIAGNOSIS — K648 Other hemorrhoids: Secondary | ICD-10-CM

## 2013-01-09 DIAGNOSIS — K920 Hematemesis: Secondary | ICD-10-CM | POA: Diagnosis present

## 2013-01-09 DIAGNOSIS — K766 Portal hypertension: Principal | ICD-10-CM | POA: Diagnosis present

## 2013-01-09 DIAGNOSIS — I059 Rheumatic mitral valve disease, unspecified: Secondary | ICD-10-CM | POA: Diagnosis present

## 2013-01-09 DIAGNOSIS — K838 Other specified diseases of biliary tract: Secondary | ICD-10-CM | POA: Diagnosis present

## 2013-01-09 DIAGNOSIS — K219 Gastro-esophageal reflux disease without esophagitis: Secondary | ICD-10-CM | POA: Diagnosis present

## 2013-01-09 DIAGNOSIS — K7460 Unspecified cirrhosis of liver: Secondary | ICD-10-CM | POA: Diagnosis present

## 2013-01-09 DIAGNOSIS — N390 Urinary tract infection, site not specified: Secondary | ICD-10-CM | POA: Diagnosis present

## 2013-01-09 DIAGNOSIS — IMO0002 Reserved for concepts with insufficient information to code with codable children: Secondary | ICD-10-CM

## 2013-01-09 DIAGNOSIS — F0280 Dementia in other diseases classified elsewhere without behavioral disturbance: Secondary | ICD-10-CM | POA: Diagnosis present

## 2013-01-09 DIAGNOSIS — I10 Essential (primary) hypertension: Secondary | ICD-10-CM | POA: Diagnosis present

## 2013-01-09 DIAGNOSIS — I8501 Esophageal varices with bleeding: Secondary | ICD-10-CM

## 2013-01-09 DIAGNOSIS — M316 Other giant cell arteritis: Secondary | ICD-10-CM | POA: Diagnosis present

## 2013-01-09 DIAGNOSIS — R188 Other ascites: Secondary | ICD-10-CM | POA: Diagnosis present

## 2013-01-09 DIAGNOSIS — M4800 Spinal stenosis, site unspecified: Secondary | ICD-10-CM | POA: Diagnosis present

## 2013-01-09 DIAGNOSIS — G309 Alzheimer's disease, unspecified: Secondary | ICD-10-CM | POA: Diagnosis present

## 2013-01-09 DIAGNOSIS — Z79899 Other long term (current) drug therapy: Secondary | ICD-10-CM

## 2013-01-09 DIAGNOSIS — D649 Anemia, unspecified: Secondary | ICD-10-CM

## 2013-01-09 DIAGNOSIS — R109 Unspecified abdominal pain: Secondary | ICD-10-CM

## 2013-01-09 DIAGNOSIS — E785 Hyperlipidemia, unspecified: Secondary | ICD-10-CM | POA: Diagnosis present

## 2013-01-09 DIAGNOSIS — R040 Epistaxis: Secondary | ICD-10-CM | POA: Diagnosis present

## 2013-01-09 DIAGNOSIS — I4891 Unspecified atrial fibrillation: Secondary | ICD-10-CM | POA: Diagnosis present

## 2013-01-09 DIAGNOSIS — Z9049 Acquired absence of other specified parts of digestive tract: Secondary | ICD-10-CM

## 2013-01-09 DIAGNOSIS — R945 Abnormal results of liver function studies: Secondary | ICD-10-CM

## 2013-01-09 DIAGNOSIS — R748 Abnormal levels of other serum enzymes: Secondary | ICD-10-CM | POA: Diagnosis present

## 2013-01-09 HISTORY — DX: Scoliosis, unspecified: M41.9

## 2013-01-09 LAB — OCCULT BLOOD, POC DEVICE: Fecal Occult Bld: POSITIVE — AB

## 2013-01-09 LAB — PROTIME-INR
INR: 1.17 (ref 0.00–1.49)
Prothrombin Time: 14.7 seconds (ref 11.6–15.2)

## 2013-01-09 LAB — URINE MICROSCOPIC-ADD ON

## 2013-01-09 LAB — LIPASE, BLOOD: Lipase: 43 U/L (ref 11–59)

## 2013-01-09 LAB — CBC WITH DIFFERENTIAL/PLATELET
Basophils Absolute: 0 K/uL (ref 0.0–0.1)
Basophils Relative: 0 % (ref 0–1)
Eosinophils Absolute: 0 10*3/uL (ref 0.0–0.7)
Eosinophils Relative: 0 % (ref 0–5)
HCT: 20 % — ABNORMAL LOW (ref 36.0–46.0)
Hemoglobin: 6.8 g/dL — CL (ref 12.0–15.0)
Lymphocytes Relative: 9 % — ABNORMAL LOW (ref 12–46)
Lymphs Abs: 0.7 K/uL (ref 0.7–4.0)
MCH: 31.1 pg (ref 26.0–34.0)
MCHC: 33 g/dL (ref 30.0–36.0)
MCV: 94.3 fL (ref 78.0–100.0)
Monocytes Absolute: 0.9 K/uL (ref 0.1–1.0)
Monocytes Relative: 12 % (ref 3–12)
Neutro Abs: 5.7 K/uL (ref 1.7–7.7)
Neutrophils Relative %: 78 % — ABNORMAL HIGH (ref 43–77)
Platelets: 143 10*3/uL — ABNORMAL LOW (ref 150–400)
RBC: 2.12 MIL/uL — ABNORMAL LOW (ref 3.87–5.11)
RDW: 14.6 % (ref 11.5–15.5)
WBC: 7.3 K/uL (ref 4.0–10.5)

## 2013-01-09 LAB — URINALYSIS, ROUTINE W REFLEX MICROSCOPIC
Bilirubin Urine: NEGATIVE
Glucose, UA: NEGATIVE mg/dL
Ketones, ur: NEGATIVE mg/dL
Nitrite: NEGATIVE
Protein, ur: NEGATIVE mg/dL
Specific Gravity, Urine: 1.026 (ref 1.005–1.030)
Urobilinogen, UA: 0.2 mg/dL (ref 0.0–1.0)
pH: 5.5 (ref 5.0–8.0)

## 2013-01-09 LAB — COMPREHENSIVE METABOLIC PANEL WITH GFR
AST: 36 U/L (ref 0–37)
CO2: 25 meq/L (ref 19–32)
Calcium: 8.8 mg/dL (ref 8.4–10.5)
Chloride: 108 meq/L (ref 96–112)
Creatinine, Ser: 0.84 mg/dL (ref 0.50–1.10)
GFR calc Af Amer: 69 mL/min — ABNORMAL LOW (ref >90)
GFR calc non Af Amer: 60 mL/min — ABNORMAL LOW (ref >90)
Glucose, Bld: 133 mg/dL — ABNORMAL HIGH (ref 70–99)
Total Bilirubin: 0.9 mg/dL (ref 0.3–1.2)

## 2013-01-09 LAB — COMPREHENSIVE METABOLIC PANEL
ALT: 28 U/L (ref 0–35)
Albumin: 2.5 g/dL — ABNORMAL LOW (ref 3.5–5.2)
Alkaline Phosphatase: 241 U/L — ABNORMAL HIGH (ref 39–117)
BUN: 46 mg/dL — ABNORMAL HIGH (ref 6–23)
Potassium: 5.1 mEq/L (ref 3.5–5.1)
Sodium: 140 mEq/L (ref 135–145)
Total Protein: 5 g/dL — ABNORMAL LOW (ref 6.0–8.3)

## 2013-01-09 LAB — APTT: aPTT: 29 seconds (ref 24–37)

## 2013-01-09 LAB — ABO/RH: ABO/RH(D): A POS

## 2013-01-09 LAB — POCT I-STAT TROPONIN I: Troponin i, poc: 0 ng/mL (ref 0.00–0.08)

## 2013-01-09 MED ORDER — PANTOPRAZOLE SODIUM 40 MG IV SOLR
40.0000 mg | Freq: Once | INTRAVENOUS | Status: AC
  Administered 2013-01-09: 40 mg via INTRAVENOUS
  Filled 2013-01-09: qty 40

## 2013-01-09 MED ORDER — PANTOPRAZOLE SODIUM 40 MG IV SOLR
40.0000 mg | Freq: Once | INTRAVENOUS | Status: AC
  Administered 2013-01-10: 40 mg via INTRAVENOUS
  Filled 2013-01-09: qty 40

## 2013-01-09 MED ORDER — ONDANSETRON HCL 4 MG/2ML IJ SOLN
4.0000 mg | Freq: Once | INTRAMUSCULAR | Status: AC
  Administered 2013-01-09: 4 mg via INTRAVENOUS
  Filled 2013-01-09: qty 2

## 2013-01-09 MED ORDER — SODIUM CHLORIDE 0.9 % IV BOLUS (SEPSIS)
500.0000 mL | Freq: Once | INTRAVENOUS | Status: AC
  Administered 2013-01-09: 500 mL via INTRAVENOUS

## 2013-01-09 NOTE — ED Provider Notes (Signed)
History     CSN: [id number]  Arrival date & time 01/09/13  1918   First MD Initiated Contact with Patient 01/09/13 1940      Chief Complaint  Patient presents with  . Rectal Bleeding  . Hematemesis    (Consider location/radiation/quality/duration/timing/severity/associated sxs/prior treatment) HPI Comments: Level V caveat due to 2 acute patient condition as well as mild dementia. Patient with apparent history of GI bleed and known peptic ulcer disease according to the patient's brother. She did mention the family that she had a lot of rectal bleeding yesterday. Also last night she had a profuse nose bleed that lasted for about 20 minutes and stopped with pressure. She's not currently on any blood thinning medications other than a baby aspirin. She was eating with her family and she began feeling very nauseated the she had a couple of episodes of emesis and resulted in hematemesis. She also has had another episode of rectal bleeding as well. No further nosebleeds except for the episode last night. Initially there is no complaint of abdominal pain, chest pain, shortness of breath.  Patient is a 79 y.o. female presenting with hematochezia. The history is provided by the patient, a relative and medical records.  Rectal Bleeding     Past Medical History  Diagnosis Date  . PMR (polymyalgia rheumatica)   . Temporal arteritis   . Osteoporosis   . Hearing loss   . Wears glasses   . Rectal bleeding   . Arthritis   . Rectal prolapse   . Hypertension   . Mitral valve prolapse   . Spinal stenosis   . Alzheimer's dementia   . Hyperlipidemia   . GERD (gastroesophageal reflux disease)   . Sciatica   . Atrial fibrillation     Past Surgical History  Procedure Laterality Date  . Abdominal hysterectomy    . Appendectomy    . Colonoscopy    . Breast surgery      left  . Hernia repair      LIH  . Esophagogastroduodenoscopy  11/02/2011    Procedure: ESOPHAGOGASTRODUODENOSCOPY (EGD);   Surgeon: Yancey Flemings, MD;  Location: [facility] ENDOSCOPY;  Service: Endoscopy;  Laterality: N/A;  . Rectal prolapse repair, rectopexy  11/16/2010    with sigmoid colectomy.  Repair w biologic mesh in pelvis  . Cholecystectomy  12/12/11    Family History  Problem Relation Age of Onset  . Cancer Brother     bladder    History  Substance Use Topics  . Smoking status: Never Smoker   . Smokeless tobacco: Never Used  . Alcohol Use: No    OB History   Grav Para Term Preterm Abortions TAB SAB Ect Mult Living                  Review of Systems  Unable to perform ROS: Acuity of condition  Gastrointestinal: Positive for hematochezia.    Allergies  Alendronate sodium  Home Medications   Current Outpatient Rx  Name  Route  Sig  Dispense  Refill  . aspirin EC 81 MG tablet   Oral   Take 81 mg by mouth at bedtime.         . calcium-vitamin D (OSCAL WITH D) 500-200 MG-UNIT per tablet   Oral   Take 1 tablet by mouth 2 (two) times daily.         [name] docusate sodium (COLACE) 100 MG capsule   Oral   Take 100 mg by mouth 2 (two) times  daily.           . donepezil (ARICEPT) 10 MG tablet   Oral   Take 10 mg by mouth at bedtime.         [name] estradiol (ESTRACE) 0.1 MG/GM vaginal cream   Vaginal   Place 2 g vaginally once a week. Applied on Thursday nights.         . fentaNYL (DURAGESIC - DOSED MCG/HR) 25 MCG/HR   Transdermal   Place 1 patch onto the skin every 3 (three) days.         [name] HYDROcodone-acetaminophen (VICODIN) 5-500 MG per tablet   Oral   Take 1 tablet by mouth every 6 (six) hours as needed for pain.          . mesalamine (CANASA) 1000 MG suppository   Rectal   Place 1 suppository (1,000 mg total) rectally daily.   30 suppository   12   . pantoprazole (PROTONIX) 40 MG tablet   Oral   Take 40 mg by mouth 2 (two) times daily.         . polyethylene glycol powder (GLYCOLAX/MIRALAX) powder   Oral   Take 17 g by mouth 2 (two) times daily.          .  predniSONE (DELTASONE) 5 MG tablet   Oral   Take 5 mg by mouth daily.           [name] witch hazel-glycerin (TUCKS) pad   Rectal   Place 1 application rectally as needed for pain.           BP 99/32  Pulse 91  Temp(Src) 98.8 F (37.1 C) (Oral)  Resp 19  SpO2 95%  Physical Exam  Nursing note and vitals reviewed. Constitutional: She appears well-developed and well-nourished. No distress.  HENT:  Head: Normocephalic and atraumatic.  Nose: No mucosal edema or rhinorrhea. No epistaxis.  Mouth/Throat: No oropharyngeal exudate.  Appearance of dried red blood around her oropharynx and inside her oropharynx.  Eyes: EOM are normal. Pupils are equal, round, and reactive to light. No scleral icterus.  Neck: Normal range of motion. Neck supple. No JVD present.  Cardiovascular: Normal rate.   No murmur heard. Pulmonary/Chest: Effort normal and breath sounds normal. She has no wheezes. She has no rales.  Abdominal: There is tenderness. There is no rebound.  Neurological: She is alert.  Skin: Skin is warm.  Psychiatric: She has a normal mood and affect.    ED Course  Procedures (including critical care time)   CRITICAL CARE Performed by: Lear Ng.   Total critical care time: 40 min  Critical care time was exclusive of separately billable procedures and treating other patients.  Critical care was necessary to treat or prevent imminent or life-threatening deterioration.  Critical care was time spent personally by me on the following activities: development of treatment plan with patient and/or surrogate as well as nursing, discussions with consultants, evaluation of patient's response to treatment, examination of patient, obtaining history from patient or surrogate, ordering and performing treatments and interventions, ordering and review of laboratory studies, ordering and review of radiographic studies, pulse oximetry and re-evaluation of patient's condition.   Labs Reviewed   CBC WITH DIFFERENTIAL - Abnormal; Notable for the following:    RBC 2.12 (*)    Hemoglobin 6.8 (*)    HCT 20.0 (*)    Platelets 143 (*)    Neutrophils Relative 78 (*)    Lymphocytes Relative 9 (*)  All other components within normal limits  COMPREHENSIVE METABOLIC PANEL - Abnormal; Notable for the following:    Glucose, Bld 133 (*)    BUN 46 (*)    Total Protein 5.0 (*)    Albumin 2.5 (*)    Alkaline Phosphatase 241 (*)    GFR calc non Af Amer 60 (*)    GFR calc Af Amer 69 (*)    All other components within normal limits  OCCULT BLOOD, POC DEVICE - Abnormal; Notable for the following:    Fecal Occult Bld POSITIVE (*)    All other components within normal limits  LIPASE, BLOOD  APTT  PROTIME-INR  URINALYSIS, ROUTINE W REFLEX MICROSCOPIC  POCT I-STAT TROPONIN I  ABO/RH  TYPE AND SCREEN  PREPARE RBC (CROSSMATCH)   No results found.   1. Upper GI bleed   2. Anemia   3. Abdominal pain     Room air saturation is 95% and I interpret this to be normal  EKG performed at time 20:53, shows sinus rhythm at a rate of 87, borderline LAD, normal intervals, no ST or T-wave abnormalities.  No significant changes compared to EKG from 12/11/2011.  9:46 PM Pt with elevated BUN, Anemia profoundly at 6.8, down from 13 on 10/13, will transfuse 2 units of PRBC's due to age, comorbitities and symptoms.  I spoke to Dr. Christella Hartigan who will see pt.  Will initially discuss with ICU for admission.  IV protonix, will give 80 mg bolus, 8 mg/hr drip.  Pt's mild abd pain and tenderness may be due to peptic ulcer.  Will get plain films to assess for obstruction or perforation which I am not clinically suspicious for.   10:02 PM Spoke to ICU and she will either admit or speak to [facility] to determine who will admit and whether to ICU or step down MDM   Patient with episode of epistaxis last night. Patient presents today do to red blood per rectum as well as an episode or 2 of hematemesis. Patient  does have some left-sided diffuse abdominal discomfort. She denies any chest pain or any current nausea. Unclear whether or not the episode of epistaxis is what led to her hematemesis. However given her prior history of ulcer, need to consider upper GI bleed. Plan is to administer IV fluids, IV Protonix. She was seen by Dr. Marina Goodell with [facility] during a hospital admission in March of 2013.         Gavin Pound. Oletta Lamas, MD 01/09/13 2202

## 2013-01-09 NOTE — H&P (Signed)
[facility]                                                                                    Patient Demographics  Tammy Myers, is a 79 y.o. female  CSN: [id number]  MRN: [id number]  DOB - 09-29-1921  Admit Date - 01/09/2013  Outpatient Primary MD for the patient is GREEN, Lenon Curt, MD   With History of -  Past Medical History  Diagnosis Date  . PMR (polymyalgia rheumatica)   . Temporal arteritis   . Osteoporosis   . Hearing loss   . Wears glasses   . Rectal bleeding   . Arthritis   . Rectal prolapse   . Hypertension   . Mitral valve prolapse   . Spinal stenosis   . Alzheimer's dementia   . Hyperlipidemia   . GERD (gastroesophageal reflux disease)   . Sciatica   . Atrial fibrillation       Past Surgical History  Procedure Laterality Date  . Abdominal hysterectomy    . Appendectomy    . Colonoscopy    . Breast surgery      left  . Hernia repair      LIH  . Esophagogastroduodenoscopy  11/02/2011    Procedure: ESOPHAGOGASTRODUODENOSCOPY (EGD);  Surgeon: Yancey Flemings, MD;  Location: [facility] ENDOSCOPY;  Service: Endoscopy;  Laterality: N/A;  . Rectal prolapse repair, rectopexy  11/16/2010    with sigmoid colectomy.  Repair w biologic mesh in pelvis  . Cholecystectomy  12/12/11    in for   Chief Complaint  Patient presents with  . Rectal Bleeding  . Hematemesis     HPI  Tammy Myers  is a 79 y.o. female, with past medical history significant for esophageal versus gastric ulcer status post EGD in the past presenting today with an episode of emesis that happened after supper. History goes back to around one week ago when she started noticing blood in her stools and she attributed it to hemorrhoids. Yesterday patient had epistaxis that was relieved with pressure. Patient not on blood thinners. Today she had significant emesis and felt extremely weak, she had abdominal pain that started in the left lower quadrant and went to the lower quadrant and went  down her thigh on the right. Patient mildly confused due to to baseline of dementia.    Review of Systems    In addition to the HPI above, No Fever-chills, No Headache, No changes with Vision or hearing, No problems swallowing food or Liquids, No Chest pain, Cough or Shortness of Breath, Had Abdominal pain earlier, No Blood in stool or Urine, No dysuria, No new skin rashes or bruises, No new joints pains-aches,  No new weakness, tingling, numbness in any extremity, No recent weight gain or loss, No polyuria, polydypsia or polyphagia, No significant Mental Stressors.  A full 10 point Review of Systems was done, except as stated above, all other Review of Systems were negative.   Social History History  Substance Use Topics  . Smoking status: Never Smoker   . Smokeless tobacco: Never Used  . Alcohol Use: No     Family History Family History  Problem Relation  Age of Onset  . Cancer Brother     bladder     Prior to Admission medications   Medication Sig Start Date End Date Taking? Authorizing Provider  aspirin EC 81 MG tablet Take 81 mg by mouth at bedtime.   Yes Historical Provider, MD  calcium-vitamin D (OSCAL WITH D) 500-200 MG-UNIT per tablet Take 1 tablet by mouth 2 (two) times daily.   Yes Historical Provider, MD  docusate sodium (COLACE) 100 MG capsule Take 100 mg by mouth 2 (two) times daily.     Yes Historical Provider, MD  donepezil (ARICEPT) 10 MG tablet Take 10 mg by mouth at bedtime.   Yes Historical Provider, MD  estradiol (ESTRACE) 0.1 MG/GM vaginal cream Place 2 g vaginally once a week. Applied on Thursday nights.   Yes Historical Provider, MD  fentaNYL (DURAGESIC - DOSED MCG/HR) 25 MCG/HR Place 1 patch onto the skin every 3 (three) days.   Yes Historical Provider, MD  HYDROcodone-acetaminophen (VICODIN) 5-500 MG per tablet Take 1 tablet by mouth every 6 (six) hours as needed for pain.    Yes Historical Provider, MD  mesalamine (CANASA) 1000 MG  suppository Place 1 suppository (1,000 mg total) rectally daily. 11/06/12  Yes Romie Levee, MD  pantoprazole (PROTONIX) 40 MG tablet Take 40 mg by mouth 2 (two) times daily.   Yes Historical Provider, MD  polyethylene glycol powder (GLYCOLAX/MIRALAX) powder Take 17 g by mouth 2 (two) times daily.  01/27/12  Yes Historical Provider, MD  predniSONE (DELTASONE) 5 MG tablet Take 5 mg by mouth daily.     Yes Historical Provider, MD  witch hazel-glycerin (TUCKS) pad Place 1 application rectally as needed for pain.   Yes Historical Provider, MD    Allergies  Allergen Reactions  . Alendronate Sodium     Physical Exam  Vitals  Blood pressure 99/32, pulse 91, temperature 98.8 F (37.1 C), temperature source Oral, resp. rate 19, SpO2 95.00%.   1. General white American female looks very pale  2. Normal affect and insight, Not Suicidal or Homicidal, Awake Alert, Oriented X 3.  3. No F.N deficits, ALL C.Nerves Intact, Strength 5/5 all 4 extremities, Sensation intact all 4 extremities, Plantars down going.  4. Ears and Eyes appear Normal, Conjunctivae clear, PERRLA. Moist Oral Mucosa.  5. Supple Neck, No JVD, No cervical lymphadenopathy appriciated, No Carotid Bruits.  6. Symmetrical Chest wall movement, Good air movement bilaterally, CTAB.  7. RRR, No Gallops, systolic murmur grade 3 noted at the fifth intercostal space, No Parasternal Heave.  8. Positive Bowel Sounds, Abdomen Soft, mild generalized tenderness, No organomegaly appriciated,No rebound -guarding or rigidity.  9.  No Cyanosis, Normal Skin Turgor, No Skin Rash or Bruise.  10. Mild tenderness noted in the right thigh area  11. No Palpable Lymph Nodes in Neck or Axillae    Data Review  CBC  Recent Labs Lab 01/09/13 2023  WBC 7.3  HGB 6.8*  HCT 20.0*  PLT 143*  MCV 94.3  MCH 31.1  MCHC 33.0  RDW 14.6  LYMPHSABS 0.7  MONOABS 0.9  EOSABS 0.0  BASOSABS 0.0    ------------------------------------------------------------------------------------------------------------------  Chemistries   Recent Labs Lab 01/09/13 2023  NA 140  K 5.1  CL 108  CO2 25  GLUCOSE 133*  BUN 46*  CREATININE 0.84  CALCIUM 8.8  AST 36  ALT 28  ALKPHOS 241*  BILITOT 0.9   ------------------------------------------------------------------------------------------------------------------  Coagulation profile  Recent Labs Lab 01/09/13 2023  INR 1.17   -------------------------------------------------------------------------------------------------------------------   ---------------------------------------------------------------------------------------------------------------  Urinalysis    Component Value Date/Time   COLORURINE YELLOW 01/09/2013 2235   APPEARANCEUR CLOUDY* 01/09/2013 2235   LABSPEC 1.026 01/09/2013 2235   PHURINE 5.5 01/09/2013 2235   GLUCOSEU NEGATIVE 01/09/2013 2235   HGBUR MODERATE* 01/09/2013 2235   BILIRUBINUR NEGATIVE 01/09/2013 2235   KETONESUR NEGATIVE 01/09/2013 2235   PROTEINUR NEGATIVE 01/09/2013 2235   UROBILINOGEN 0.2 01/09/2013 2235   NITRITE NEGATIVE 01/09/2013 2235   LEUKOCYTESUR MODERATE* 01/09/2013 2235    ----------------------------------------------------------------------------------------------------------------   Imaging results:   Dg Chest 1 View  01/09/2013  *RADIOLOGY REPORT*  Clinical Data: Abdominal pain and rectal bleeding.  CHEST - 1 VIEW  Comparison: 11/14/2010  Findings: Cardiac enlargement with normal pulmonary vascularity. Emphysematous changes in the lungs.  Fibrosis in the lung bases. Peribronchial thickening and central interstitial changes suggesting chronic bronchitis.  No focal airspace disease or consolidation.  No blunting of costophrenic angles.  No pneumothorax.  Mediastinal contours appear intact.  Calcified and tortuous aorta.  Degenerative changes in the shoulders with suggestion of old fracture  deformity of the distal right clavicle. No significant changes since the previous study.  IMPRESSION: Numbness and chronic bronchitic changes in the lungs with interstitial fibrosis in the bases.  No evidence of active consolidation.   Original Report Authenticated By: Burman Nieves, M.D.    Dg Abd 2 Views  01/09/2013  *RADIOLOGY REPORT*  Clinical Data: Abdominal pain and rectal bleeding.  ABDOMEN - 2 VIEW  Comparison: 12/14/2011  Findings: Marked thoracolumbar scoliosis convex towards the left with diffuse degenerative changes throughout the thoracic spine. Degenerative changes in the hips.  Scattered gas and stool in the colon.  Scattered gas filled nondistended small bowel.  No small or large bowel distension.  No free intra-abdominal air.  No abnormal air fluid levels.  Surgical clips in the right upper quadrant and pelvis.  No radiopaque stones.  Aortoiliac vascular calcifications.  IMPRESSION: Nonobstructive bowel gas pattern.   Original Report Authenticated By: Burman Nieves, M.D.       Assessment & Plan  1. GI bleed, patient has history of gastric versus esophageal ulcer status post EGD in the past. Dr. Christella Hartigan (GI) was consulted. Patient started on Protonix drip. Blood transfusion initiated and follow  hematocrit every 6 hours 2. History of steatohepatitis with early liver cirrhosis. 3. History of polymyalgia rheumatica and temporal arteritis on chronic prednisone. Will switch to IV Solu-Cortef 4. UTI will start on Levaquin and check urine culture 5. Alzheimer's dementia 6. History of arthritis 7. History of rectal prolapse status post colon resection, status post cholelithiasis.   DVT Prophylaxis SCDs  AM Labs Ordered, also please review Full Orders  Family Communication: Admission, patients condition and plan of care including tests being ordered have been discussed with the patient and sister who was at bedside who indicate understanding and agree with the plan and Code  Status.  Code Status full  Disposition Plan: Home  Time spent in minutes : 55 minutes  Condition critical

## 2013-01-09 NOTE — ED Notes (Signed)
ZOX:[id number]<VW> Expected date:01/09/13<BR> Expected time:<BR> Means of arrival:<BR> Comments:<BR> 89 female from Nursing home coffee emesis and blood in stool.

## 2013-01-09 NOTE — ED Notes (Signed)
Per EMS: Pt is from [facility]. Report vomiting blood and blood in stool starting an hour ago. Pain all over.

## 2013-01-10 ENCOUNTER — Inpatient Hospital Stay (HOSPITAL_COMMUNITY): Payer: Medicare Other

## 2013-01-10 ENCOUNTER — Encounter (HOSPITAL_COMMUNITY)
Admission: EM | Disposition: A | Discharge status: Skilled Nursing Facility | Payer: Self-pay | Source: Home / Self Care | Attending: Family Medicine

## 2013-01-10 ENCOUNTER — Encounter (HOSPITAL_COMMUNITY): Payer: Self-pay

## 2013-01-10 DIAGNOSIS — K648 Other hemorrhoids: Secondary | ICD-10-CM

## 2013-01-10 DIAGNOSIS — I8501 Esophageal varices with bleeding: Secondary | ICD-10-CM

## 2013-01-10 DIAGNOSIS — K920 Hematemesis: Secondary | ICD-10-CM | POA: Diagnosis present

## 2013-01-10 DIAGNOSIS — R040 Epistaxis: Secondary | ICD-10-CM

## 2013-01-10 HISTORY — PX: ESOPHAGOGASTRODUODENOSCOPY: SHX5428

## 2013-01-10 LAB — MRSA PCR SCREENING: MRSA by PCR: NEGATIVE

## 2013-01-10 LAB — HEMOGLOBIN AND HEMATOCRIT, BLOOD: Hemoglobin: 11.3 g/dL — ABNORMAL LOW (ref 12.0–15.0)

## 2013-01-10 LAB — PREPARE RBC (CROSSMATCH)

## 2013-01-10 LAB — HEMOGLOBIN: Hemoglobin: 6.1 g/dL — CL (ref 12.0–15.0)

## 2013-01-10 SURGERY — EGD (ESOPHAGOGASTRODUODENOSCOPY)
Anesthesia: Moderate Sedation

## 2013-01-10 MED ORDER — EPINEPHRINE HCL 0.1 MG/ML IJ SOLN
INTRAMUSCULAR | Status: AC
  Filled 2013-01-10: qty 10

## 2013-01-10 MED ORDER — SODIUM CHLORIDE 0.9 % IJ SOLN
10.0000 mL | Freq: Two times a day (BID) | INTRAMUSCULAR | Status: DC
  Administered 2013-01-10 – 2013-01-11 (×2): 10 mL

## 2013-01-10 MED ORDER — SODIUM CHLORIDE 0.9 % IJ SOLN
3.0000 mL | Freq: Two times a day (BID) | INTRAMUSCULAR | Status: DC
  Administered 2013-01-10 – 2013-01-11 (×3): 3 mL via INTRAVENOUS

## 2013-01-10 MED ORDER — HYDROCORTISONE SOD SUCCINATE 100 MG IJ SOLR
50.0000 mg | Freq: Three times a day (TID) | INTRAMUSCULAR | Status: DC
  Administered 2013-01-10 – 2013-01-11 (×5): 50 mg via INTRAVENOUS
  Filled 2013-01-10: qty 1
  Filled 2013-01-10: qty 2
  Filled 2013-01-10 (×3): qty 1
  Filled 2013-01-10: qty 2
  Filled 2013-01-10 (×2): qty 1

## 2013-01-10 MED ORDER — SODIUM CHLORIDE 0.9 % IV SOLN
INTRAVENOUS | Status: DC
  Administered 2013-01-10 – 2013-01-12 (×2): via INTRAVENOUS

## 2013-01-10 MED ORDER — ONDANSETRON HCL 4 MG/2ML IJ SOLN: 4.0000 mg | Freq: Four times a day (QID) | INTRAMUSCULAR | Status: DC | PRN

## 2013-01-10 MED ORDER — SODIUM CHLORIDE 0.9 % IJ SOLN
INTRAMUSCULAR | Status: DC | PRN
  Administered 2013-01-10: 15:00:00

## 2013-01-10 MED ORDER — OCTREOTIDE ACETATE 500 MCG/ML IJ SOLN
25.0000 ug/h | INTRAMUSCULAR | Status: DC
  Administered 2013-01-10: 50 ug/h via INTRAVENOUS
  Administered 2013-01-10 – 2013-01-13 (×4): 25 ug/h via INTRAVENOUS
  Filled 2013-01-10 (×15): qty 1

## 2013-01-10 MED ORDER — FENTANYL CITRATE 0.05 MG/ML IJ SOLN
INTRAMUSCULAR | Status: DC | PRN
  Administered 2013-01-10 (×2): 25 ug via INTRAVENOUS

## 2013-01-10 MED ORDER — PANTOPRAZOLE SODIUM 40 MG IV SOLR
40.0000 mg | Freq: Two times a day (BID) | INTRAVENOUS | Status: DC
  Administered 2013-01-10 – 2013-01-11 (×3): 40 mg via INTRAVENOUS
  Filled 2013-01-10 (×5): qty 40

## 2013-01-10 MED ORDER — OCTREOTIDE LOAD VIA INFUSION
50.0000 ug | Freq: Once | INTRAVENOUS | Status: AC
  Administered 2013-01-10: 50 ug via INTRAVENOUS
  Filled 2013-01-10: qty 25

## 2013-01-10 MED ORDER — SODIUM CHLORIDE 0.9 % IV SOLN
8.0000 mg/h | INTRAVENOUS | Status: DC
  Administered 2013-01-10 (×2): 8 mg/h via INTRAVENOUS
  Filled 2013-01-10 (×4): qty 80

## 2013-01-10 MED ORDER — LEVOFLOXACIN IN D5W 500 MG/100ML IV SOLN
500.0000 mg | INTRAVENOUS | Status: DC
  Administered 2013-01-10 (×2): 500 mg via INTRAVENOUS
  Filled 2013-01-10 (×2): qty 100

## 2013-01-10 MED ORDER — COLLAGENASE 250 UNIT/GM EX OINT
TOPICAL_OINTMENT | Freq: Every day | CUTANEOUS | Status: DC
  Administered 2013-01-10 – 2013-01-14 (×5): via TOPICAL
  Filled 2013-01-10 (×2): qty 30

## 2013-01-10 MED ORDER — FENTANYL 25 MCG/HR TD PT72
25.0000 ug | MEDICATED_PATCH | TRANSDERMAL | Status: DC
  Administered 2013-01-10 – 2013-01-13 (×2): 25 ug via TRANSDERMAL
  Filled 2013-01-10 (×2): qty 1

## 2013-01-10 MED ORDER — FUROSEMIDE 10 MG/ML IJ SOLN
20.0000 mg | Freq: Once | INTRAMUSCULAR | Status: AC
  Administered 2013-01-10: 20 mg via INTRAVENOUS
  Filled 2013-01-10: qty 2

## 2013-01-10 MED ORDER — MIDAZOLAM HCL 10 MG/2ML IJ SOLN
INTRAMUSCULAR | Status: AC
  Filled 2013-01-10: qty 2

## 2013-01-10 MED ORDER — SODIUM CHLORIDE 0.9 % IJ SOLN: 10.0000 mL | INTRAMUSCULAR | Status: DC | PRN

## 2013-01-10 MED ORDER — FENTANYL CITRATE 0.05 MG/ML IJ SOLN
INTRAMUSCULAR | Status: AC
  Filled 2013-01-10: qty 2

## 2013-01-10 MED ORDER — MORPHINE SULFATE 2 MG/ML IJ SOLN
1.0000 mg | INTRAMUSCULAR | Status: DC | PRN
  Administered 2013-01-10 – 2013-01-11 (×4): 1 mg via INTRAVENOUS
  Filled 2013-01-10 (×4): qty 1

## 2013-01-10 MED ORDER — DIPHENHYDRAMINE HCL 50 MG/ML IJ SOLN
INTRAMUSCULAR | Status: AC
  Filled 2013-01-10: qty 1

## 2013-01-10 MED ORDER — FUROSEMIDE 10 MG/ML IJ SOLN
INTRAMUSCULAR | Status: AC
  Filled 2013-01-10: qty 4

## 2013-01-10 MED ORDER — MIDAZOLAM HCL 10 MG/2ML IJ SOLN
INTRAMUSCULAR | Status: DC | PRN
  Administered 2013-01-10 (×2): 1 mg via INTRAVENOUS
  Administered 2013-01-10: 2 mg via INTRAVENOUS
  Administered 2013-01-10: 1 mg via INTRAVENOUS

## 2013-01-10 MED ORDER — ONDANSETRON HCL 4 MG PO TABS: 4.0000 mg | ORAL_TABLET | Freq: Four times a day (QID) | ORAL | Status: DC | PRN

## 2013-01-10 NOTE — Consult Note (Signed)
Referring Provider: [facility] hospitalist Primary Care Physician:  GREEN, ARTHUR G, MD Primary Gastroenterologist:  Dr. Perry  Reason for Consultation:  GI Bleed  HPI: Tammy Myers is a 79 y.o. female  with significant Alzheimer's dementia, history of polymyalgia rheumatica, hypertension, mitral valve prolapse, spinal stenosis, history of atrial fibrillation, and chronic GERD . Patient was seen by GI in January of 2013 for GI bleeding and had an upper endoscopy at that time which showed severe erosive esophagitis. She has not had colonoscopy that I can see in peptic records though may have had one remotely. Her records state that she has had a sigmoid colectomy and has history of rectal prolapse She had recently been seeing Dr. Thomas-surgery for internal hemorrhoid which had been painful and intermittently bleeding . He had sclerosis of the hemorrhoids about a month ago and was seen after that with complaints of rectal pain and has been on Canasa suppositories . Patient is a nursing home resident a very poor historian but apparently had an episode of epistaxis the large amount of bleeding yesterday and then apparently also vomited blood. Unclear to me whether she had actually had any rectal bleeding that was any different than what she had been having from her hemorrhoids over the past few weeks. Has not manifested any evidence of bleeding since admission last evening. When asked about pain she states that she hurts in her lower left abdomen but on reviewing her records this has been a chronic complaint . He had MRI of the abdomen and CT of the abdomen proximally 6 months ago with no findings in the left lower abdomen to account for pain. Her last hemoglobin when checked in October of 2013 was 13.2 on arrival to the emergency room last night had a hemoglobin of 6.8-she is being transfused 2 units of packed rbc's. She has been hemodynamically stable since admission. She is on a baby aspirin daily but  no other current blood thinners.    Past Medical History  Diagnosis Date  . PMR (polymyalgia rheumatica)   . Temporal arteritis   . Osteoporosis   . Hearing loss   . Wears glasses   . Rectal bleeding   . Arthritis   . Rectal prolapse   . Hypertension   . Mitral valve prolapse   . Spinal stenosis   . Alzheimer's dementia   . Hyperlipidemia   . GERD (gastroesophageal reflux disease)   . Sciatica   . Atrial fibrillation   . Scoliosis     Past Surgical History  Procedure Laterality Date  . Abdominal hysterectomy    . Appendectomy    . Colonoscopy    . Breast surgery      left  . Hernia repair      LIH  . Esophagogastroduodenoscopy  11/02/2011    Procedure: ESOPHAGOGASTRODUODENOSCOPY (EGD);  Surgeon: John Perry, MD;  Location: [facility] ENDOSCOPY;  Service: Endoscopy;  Laterality: N/A;  . Rectal prolapse repair, rectopexy  11/16/2010    with sigmoid colectomy.  Repair w biologic mesh in pelvis  . Cholecystectomy  12/12/11    Prior to Admission medications   Medication Sig Start Date End Date Taking? Authorizing Provider  aspirin EC 81 MG tablet Take 81 mg by mouth at bedtime.   Yes Historical Provider, MD  calcium-vitamin D (OSCAL WITH D) 500-200 MG-UNIT per tablet Take 1 tablet by mouth 2 (two) times daily.   Yes Historical Provider, MD  docusate sodium (COLACE) 100 MG capsule Take 100 mg by mouth   2 (two) times daily.     Yes Historical Provider, MD  donepezil (ARICEPT) 10 MG tablet Take 10 mg by mouth at bedtime.   Yes Historical Provider, MD  estradiol (ESTRACE) 0.1 MG/GM vaginal cream Place 2 g vaginally once a week. Applied on Thursday nights.   Yes Historical Provider, MD  fentaNYL (DURAGESIC - DOSED MCG/HR) 25 MCG/HR Place 1 patch onto the skin every 3 (three) days.   Yes Historical Provider, MD  HYDROcodone-acetaminophen (VICODIN) 5-500 MG per tablet Take 1 tablet by mouth every 6 (six) hours as needed for pain.    Yes Historical Provider, MD  mesalamine (CANASA) 1000 MG  suppository Place 1 suppository (1,000 mg total) rectally daily. 11/06/12  Yes Alicia Thomas, MD  pantoprazole (PROTONIX) 40 MG tablet Take 40 mg by mouth 2 (two) times daily.   Yes Historical Provider, MD  polyethylene glycol powder (GLYCOLAX/MIRALAX) powder Take 17 g by mouth 2 (two) times daily.  01/27/12  Yes Historical Provider, MD  predniSONE (DELTASONE) 5 MG tablet Take 5 mg by mouth daily.     Yes Historical Provider, MD  witch hazel-glycerin (TUCKS) pad Place 1 application rectally as needed for pain.   Yes Historical Provider, MD    Current Facility-Administered Medications  Medication Dose Route Frequency Provider Last Rate Last Dose  . 0.9 %  sodium chloride infusion   Intravenous Continuous Ali Hijazi, MD 75 mL/hr at 01/10/13 0039    . fentaNYL (DURAGESIC - dosed mcg/hr) patch 25 mcg  25 mcg Transdermal Q72H Ali Hijazi, MD   25 mcg at 01/10/13 0508  . hydrocortisone sodium succinate (SOLU-CORTEF) 100 mg/2 mL injection 50 mg  50 mg Intravenous Q8H Ali Hijazi, MD   50 mg at 01/10/13 0904  . levofloxacin (LEVAQUIN) IVPB 500 mg  500 mg Intravenous Q24H Ali Hijazi, MD   500 mg at 01/10/13 0217  . morphine 2 MG/ML injection 1 mg  1 mg Intravenous Q4H PRN Ali Hijazi, MD   1 mg at 01/10/13 0437  . ondansetron (ZOFRAN) tablet 4 mg  4 mg Oral Q6H PRN Ali Hijazi, MD       Or  . ondansetron (ZOFRAN) injection 4 mg  4 mg Intravenous Q6H PRN Ali Hijazi, MD      . pantoprazole (PROTONIX) 80 mg in sodium chloride 0.9 % 250 mL infusion  8 mg/hr Intravenous Continuous Ali Hijazi, MD 25 mL/hr at 01/10/13 0800 8 mg/hr at 01/10/13 0800  . sodium chloride 0.9 % injection 3 mL  3 mL Intravenous Q12H Ali Hijazi, MD   3 mL at 01/10/13 0905    Allergies as of 01/09/2013 - Review Complete 01/09/2013  Allergen Reaction Noted  . Alendronate sodium  11/01/2011    Family History  Problem Relation Age of Onset  . Cancer Brother     bladder    History   Social History  . Marital Status: Widowed     Spouse Name: N/A    Number of Children: 0  . Years of Education: N/A   Occupational History  .     Social History Main Topics  . Smoking status: Never Smoker   . Smokeless tobacco: Never Used  . Alcohol Use: No  . Drug Use: No  . Sexually Active: No   Other Topics Concern  . Not on file   Social History Narrative  . No narrative on file    Review of Systems: Pertinent positive and negative review of systems were noted in the above HPI   section.  All other review of systems was otherwise negative.  Physical Exam: Vital signs in last 24 hours: Temp:  [97.6 F (36.4 C)-98.8 F (37.1 C)] 97.7 F (36.5 C) (04/04 0914) Pulse Rate:  [83-104] 93 (04/04 0915) Resp:  [18-31] 25 (04/04 0915) BP: (95-124)/(32-68) 122/58 mmHg (04/04 0915) SpO2:  [87 %-100 %] 99 % (04/04 0800) Weight:  [114 lb 3.2 oz (51.8 kg)] 114 lb 3.2 oz (51.8 kg) (04/04 0100) Last BM Date: 01/10/13 General:   Alert, thin, elderlyWF  pleasant and cooperative in NAD, confused and HOH Head:  Normocephalic and atraumatic. Eyes:  Sclera clear, no icterus.   Conjunctiva pale Ears:  Normal auditory acuity. Nose:  No deformity, discharge,  or lesions. Mouth:  No deformity or lesions.   Neck:  Supple; no masses or thyromegaly. Lungs:  Clear throughout to auscultation.   No wheezes, crackles, or rhonchi.  Heart:  Regular rate and rhythm; blowing sytolic murmur Abdomen:  Soft, Tender LLQ over iliac crest , BS active,nonpalp mass or hsm.   Rectal: small amt of dark stool in rectal vault heme positive-no gross blood, nontender Msk:  Symmetrical without gross deformities. . Pulses:  Normal pulses noted. Extremities:  Without clubbing or edema. Neurologic:  Alert and  oriented x2  demented. Skin:  Intact without significant lesions or rashes.. Psych:  Alert and cooperative.  Intake/Output from previous day: 04/03 0701 - 04/04 0700 In: 1295 [I.V.:832.5; Blood:362.5; IV Piggyback:100] Out: 400 [Urine:400] Intake/Output  this shift: Total I/O In: 412.5 [I.V.:350; Blood:62.5] Out: 225 [Urine:225]  Lab Results:  Recent Labs  01/09/13 2023 01/10/13 0205  WBC 7.3  --   HGB 6.8* 6.1*  HCT 20.0*  --   PLT 143*  --    BMET  Recent Labs  01/09/13 2023  NA 140  K 5.1  CL 108  CO2 25  GLUCOSE 133*  BUN 46*  CREATININE 0.84  CALCIUM 8.8   LFT  Recent Labs  01/09/13 2023  PROT 5.0*  ALBUMIN 2.5*  AST 36  ALT 28  ALKPHOS 241*  BILITOT 0.9   PT/INR  Recent Labs  01/09/13 2023  LABPROT 14.7  INR 1.17    Studies/Results: Dg Chest 1 View  01/09/2013  *RADIOLOGY REPORT*  Clinical Data: Abdominal pain and rectal bleeding.  CHEST - 1 VIEW  Comparison: 11/14/2010  Findings: Cardiac enlargement with normal pulmonary vascularity. Emphysematous changes in the lungs.  Fibrosis in the lung bases. Peribronchial thickening and central interstitial changes suggesting chronic bronchitis.  No focal airspace disease or consolidation.  No blunting of costophrenic angles.  No pneumothorax.  Mediastinal contours appear intact.  Calcified and tortuous aorta.  Degenerative changes in the shoulders with suggestion of old fracture deformity of the distal right clavicle. No significant changes since the previous study.  IMPRESSION: Numbness and chronic bronchitic changes in the lungs with interstitial fibrosis in the bases.  No evidence of active consolidation.   Original Report Authenticated By: William Stevens, M.D.    Dg Abd 2 Views  01/09/2013  *RADIOLOGY REPORT*  Clinical Data: Abdominal pain and rectal bleeding.  ABDOMEN - 2 VIEW  Comparison: 12/14/2011  Findings: Marked thoracolumbar scoliosis convex towards the left with diffuse degenerative changes throughout the thoracic spine. Degenerative changes in the hips.  Scattered gas and stool in the colon.  Scattered gas filled nondistended small bowel.  No small or large bowel distension.  No free intra-abdominal air.  No abnormal air fluid levels.  Surgical clips  in the   right upper quadrant and pelvis.  No radiopaque stones.  Aortoiliac vascular calcifications.  IMPRESSION: Nonobstructive bowel gas pattern.   Original Report Authenticated By: William Stevens, M.D.     IMPRESSION:  #1  79-year-old female with history of erosive esophagitis on EGD 2013, and now with epistaxis and/or hematemesis prior to admission. Patient has dementia and is a very poor historian and there has been no evidence of active bleeding since admission. She did have a significant drop in her hemoglobin from 13 - 6.8 over the past 5 months but it is not clear how much of this is acute. Will rule out upper GI bleed secondary to recurrent esophagitis or peptic ulcer disease #2 profound normocytic anemia #3 Alzheimer's dementia #4 internal hemorrhoids-recent surgical evaluation and sclerosis #5 history of hypertension #6 history of atrial fibrillation  PLAN: #1 Transfuse to keep hemoglobin above 8 #2 Patient was placed on a PPI infusion, continue for now #3 Schedule for upper endoscopy with Dr. Taleen Prosser-will discuss with family for consent and scheduled for this afternoon. Further plans pending findings at endoscopy   Amy Esterwood  01/10/2013, 9:54 AM     I have taken a history, examined the patient and reviewed the chart. I agree with the Advanced Practitioner's note, impression and recommendations. EGD later today to further evaluate. Hematemesis and anemia could be from epistaxis which may need further evaluation.  Nyzir Dubois T Harumi Yamin MD FACG 

## 2013-01-10 NOTE — Progress Notes (Signed)
Received report from Endo, Soledad Gerlach, RN.

## 2013-01-10 NOTE — H&P (View-Only) (Signed)
Referring Provider: [facility] hospitalist Primary Care Physician:  [name], MD Primary Gastroenterologist:  Dr. Marina Goodell  Reason for Consultation:  GI Bleed  HPI: Tammy Myers is a 79 y.o. female  with significant Alzheimer's dementia, history of polymyalgia rheumatica, hypertension, mitral valve prolapse, spinal stenosis, history of atrial fibrillation, and chronic GERD . Patient was seen by GI in January of 2013 for GI bleeding and had an upper endoscopy at that time which showed severe erosive esophagitis. She has not had colonoscopy that I can see in peptic records though may have had one remotely. Her records state that she has had a sigmoid colectomy and has history of rectal prolapse She had recently been seeing Dr. Luanna Salk for internal hemorrhoid which had been painful and intermittently bleeding . He had sclerosis of the hemorrhoids about a month ago and was seen after that with complaints of rectal pain and has been on Canasa suppositories . Patient is a nursing home resident a very poor historian but apparently had an episode of epistaxis the large amount of bleeding yesterday and then apparently also vomited blood. Unclear to me whether she had actually had any rectal bleeding that was any different than what she had been having from her hemorrhoids over the past few weeks. Has not manifested any evidence of bleeding since admission last evening. When asked about pain she states that she hurts in her lower left abdomen but on reviewing her records this has been a chronic complaint . He had MRI of the abdomen and CT of the abdomen proximally 6 months ago with no findings in the left lower abdomen to account for pain. Her last hemoglobin when checked in October of 2013 was 13.2 on arrival to the emergency room last night had a hemoglobin of 6.8-she is being transfused 2 units of packed rbc's. She has been hemodynamically stable since admission. She is on a baby aspirin daily but  no other current blood thinners.    Past Medical History  Diagnosis Date  . PMR (polymyalgia rheumatica)   . Temporal arteritis   . Osteoporosis   . Hearing loss   . Wears glasses   . Rectal bleeding   . Arthritis   . Rectal prolapse   . Hypertension   . Mitral valve prolapse   . Spinal stenosis   . Alzheimer's dementia   . Hyperlipidemia   . GERD (gastroesophageal reflux disease)   . Sciatica   . Atrial fibrillation   . Scoliosis     Past Surgical History  Procedure Laterality Date  . Abdominal hysterectomy    . Appendectomy    . Colonoscopy    . Breast surgery      left  . Hernia repair      LIH  . Esophagogastroduodenoscopy  11/02/2011    Procedure: ESOPHAGOGASTRODUODENOSCOPY (EGD);  Surgeon: Yancey Flemings, MD;  Location: [facility] ENDOSCOPY;  Service: Endoscopy;  Laterality: N/A;  . Rectal prolapse repair, rectopexy  11/16/2010    with sigmoid colectomy.  Repair w biologic mesh in pelvis  . Cholecystectomy  12/12/11    Prior to Admission medications   Medication Sig Start Date End Date Taking? Authorizing Provider  aspirin EC 81 MG tablet Take 81 mg by mouth at bedtime.   Yes Historical Provider, MD  calcium-vitamin D (OSCAL WITH D) 500-200 MG-UNIT per tablet Take 1 tablet by mouth 2 (two) times daily.   Yes Historical Provider, MD  docusate sodium (COLACE) 100 MG capsule Take 100 mg by mouth  2 (two) times daily.     Yes Historical Provider, MD  donepezil (ARICEPT) 10 MG tablet Take 10 mg by mouth at bedtime.   Yes Historical Provider, MD  estradiol (ESTRACE) 0.1 MG/GM vaginal cream Place 2 g vaginally once a week. Applied on Thursday nights.   Yes Historical Provider, MD  fentaNYL (DURAGESIC - DOSED MCG/HR) 25 MCG/HR Place 1 patch onto the skin every 3 (three) days.   Yes Historical Provider, MD  HYDROcodone-acetaminophen (VICODIN) 5-500 MG per tablet Take 1 tablet by mouth every 6 (six) hours as needed for pain.    Yes Historical Provider, MD  mesalamine (CANASA) 1000 MG  suppository Place 1 suppository (1,000 mg total) rectally daily. 11/06/12  Yes Romie Levee, MD  pantoprazole (PROTONIX) 40 MG tablet Take 40 mg by mouth 2 (two) times daily.   Yes Historical Provider, MD  polyethylene glycol powder (GLYCOLAX/MIRALAX) powder Take 17 g by mouth 2 (two) times daily.  01/27/12  Yes Historical Provider, MD  predniSONE (DELTASONE) 5 MG tablet Take 5 mg by mouth daily.     Yes Historical Provider, MD  witch hazel-glycerin (TUCKS) pad Place 1 application rectally as needed for pain.   Yes Historical Provider, MD    Current Facility-Administered Medications  Medication Dose Route Frequency Provider Last Rate Last Dose  . 0.9 %  sodium chloride infusion   Intravenous Continuous Carron Curie, MD 75 mL/hr at 01/10/13 0039    . fentaNYL (DURAGESIC - dosed mcg/hr) patch 25 mcg  25 mcg Transdermal Q72H Carron Curie, MD   25 mcg at 01/10/13 0508  . hydrocortisone sodium succinate (SOLU-CORTEF) 100 mg/2 mL injection 50 mg  50 mg Intravenous Q8H Carron Curie, MD   50 mg at 01/10/13 0904  . levofloxacin (LEVAQUIN) IVPB 500 mg  500 mg Intravenous Q24H Carron Curie, MD   500 mg at 01/10/13 0217  . morphine 2 MG/ML injection 1 mg  1 mg Intravenous Q4H PRN Carron Curie, MD   1 mg at 01/10/13 0437  . ondansetron (ZOFRAN) tablet 4 mg  4 mg Oral Q6H PRN Carron Curie, MD       Or  . ondansetron [facility]) injection 4 mg  4 mg Intravenous Q6H PRN Carron Curie, MD      . pantoprazole (PROTONIX) 80 mg in sodium chloride 0.9 % 250 mL infusion  8 mg/hr Intravenous Continuous Carron Curie, MD 25 mL/hr at 01/10/13 0800 8 mg/hr at 01/10/13 0800  . sodium chloride 0.9 % injection 3 mL  3 mL Intravenous Q12H Carron Curie, MD   3 mL at 01/10/13 0905    Allergies as of 01/09/2013 - Review Complete 01/09/2013  Allergen Reaction Noted  . Alendronate sodium  11/01/2011    Family History  Problem Relation Age of Onset  . Cancer Brother     bladder    History   Social History  . Marital Status: Widowed     Spouse Name: N/A    Number of Children: 0  . Years of Education: N/A   Occupational History  .     Social History Main Topics  . Smoking status: Never Smoker   . Smokeless tobacco: Never Used  . Alcohol Use: No  . Drug Use: No  . Sexually Active: No   Other Topics Concern  . Not on file   Social History Narrative  . No narrative on file    Review of Systems: Pertinent positive and negative review of systems were noted in the above HPI  section.  All other review of systems was otherwise negative.  Physical Exam: Vital signs in last 24 hours: Temp:  [97.6 F (36.4 C)-98.8 F (37.1 C)] 97.7 F (36.5 C) (04/04 0914) Pulse Rate:  [83-104] 93 (04/04 0915) Resp:  [18-31] 25 (04/04 0915) BP: (95-124)/(32-68) 122/58 mmHg (04/04 0915) SpO2:  [87 %-100 %] 99 % (04/04 0800) Weight:  [114 lb 3.2 oz (51.8 kg)] 114 lb 3.2 oz (51.8 kg) (04/04 0100) Last BM Date: 01/10/13 General:   Alert, thin, elderlyWF  pleasant and cooperative in NAD, confused and HOH Head:  Normocephalic and atraumatic. Eyes:  Sclera clear, no icterus.   Conjunctiva pale Ears:  Normal auditory acuity. Nose:  No deformity, discharge,  or lesions. Mouth:  No deformity or lesions.   Neck:  Supple; no masses or thyromegaly. Lungs:  Clear throughout to auscultation.   No wheezes, crackles, or rhonchi.  Heart:  Regular rate and rhythm; blowing sytolic murmur Abdomen:  Soft, Tender LLQ over iliac crest , BS active,nonpalp mass or hsm.   Rectal: small amt of dark stool in rectal vault heme positive-no gross blood, nontender Msk:  Symmetrical without gross deformities. . Pulses:  Normal pulses noted. Extremities:  Without clubbing or edema. Neurologic:  Alert and  oriented x2  demented. Skin:  Intact without significant lesions or rashes.. Psych:  Alert and cooperative.  Intake/Output from previous day: 04/03 0701 - 04/04 0700 In: 1295 [I.V.:832.5; Blood:362.5; IV Piggyback:100] Out: 400 [Urine:400] Intake/Output  this shift: Total I/O In: 412.5 [I.V.:350; Blood:62.5] Out: 225 [Urine:225]  Lab Results:  Recent Labs  01/09/13 2023 01/10/13 0205  WBC 7.3  --   HGB 6.8* 6.1*  HCT 20.0*  --   PLT 143*  --    BMET  Recent Labs  01/09/13 2023  NA 140  K 5.1  CL 108  CO2 25  GLUCOSE 133*  BUN 46*  CREATININE 0.84  CALCIUM 8.8   LFT  Recent Labs  01/09/13 2023  PROT 5.0*  ALBUMIN 2.5*  AST 36  ALT 28  ALKPHOS 241*  BILITOT 0.9   PT/INR  Recent Labs  01/09/13 2023  LABPROT 14.7  INR 1.17    Studies/Results: Dg Chest 1 View  01/09/2013  *RADIOLOGY REPORT*  Clinical Data: Abdominal pain and rectal bleeding.  CHEST - 1 VIEW  Comparison: 11/14/2010  Findings: Cardiac enlargement with normal pulmonary vascularity. Emphysematous changes in the lungs.  Fibrosis in the lung bases. Peribronchial thickening and central interstitial changes suggesting chronic bronchitis.  No focal airspace disease or consolidation.  No blunting of costophrenic angles.  No pneumothorax.  Mediastinal contours appear intact.  Calcified and tortuous aorta.  Degenerative changes in the shoulders with suggestion of old fracture deformity of the distal right clavicle. No significant changes since the previous study.  IMPRESSION: Numbness and chronic bronchitic changes in the lungs with interstitial fibrosis in the bases.  No evidence of active consolidation.   Original Report Authenticated By: Burman Nieves, M.D.    Dg Abd 2 Views  01/09/2013  *RADIOLOGY REPORT*  Clinical Data: Abdominal pain and rectal bleeding.  ABDOMEN - 2 VIEW  Comparison: 12/14/2011  Findings: Marked thoracolumbar scoliosis convex towards the left with diffuse degenerative changes throughout the thoracic spine. Degenerative changes in the hips.  Scattered gas and stool in the colon.  Scattered gas filled nondistended small bowel.  No small or large bowel distension.  No free intra-abdominal air.  No abnormal air fluid levels.  Surgical clips  in the  right upper quadrant and pelvis.  No radiopaque stones.  Aortoiliac vascular calcifications.  IMPRESSION: Nonobstructive bowel gas pattern.   Original Report Authenticated By: Burman Nieves, M.D.     IMPRESSION:  #59  79 year old female with history of erosive esophagitis on EGD 2013, and now with epistaxis and/or hematemesis prior to admission. Patient has dementia and is a very poor historian and there has been no evidence of active bleeding since admission. She did have a significant drop in her hemoglobin from 13 - 6.8 over the past 5 months but it is not clear how much of this is acute. Will rule out upper GI bleed secondary to recurrent esophagitis or peptic ulcer disease #2 profound normocytic anemia #3 Alzheimer's dementia #4 internal hemorrhoids-recent surgical evaluation and sclerosis #5 history of hypertension #6 history of atrial fibrillation  PLAN: #1 Transfuse to keep hemoglobin above 8 #2 Patient was placed on a PPI infusion, continue for now #3 Schedule for upper endoscopy with Dr. Ernesta Amble discuss with family for consent and scheduled for this afternoon. Further plans pending findings at endoscopy   Amy [facility]  01/10/2013, 9:54 AM     I have taken a history, examined the patient and reviewed the chart. I agree with the Advanced Practitioner's note, impression and recommendations. EGD later today to further evaluate. Hematemesis and anemia could be from epistaxis which may need further evaluation.  Meryl Dare MD [facility]

## 2013-01-10 NOTE — Progress Notes (Signed)
CARE MANAGEMENT NOTE 01/10/2013  Patient:  Tammy Myers, Tammy Myers   Account Number:  [id number]  Date Initiated:  01/10/2013  Documentation initiated by:  Tennille Montelongo  Subjective/Objective Assessment:   pt with active gi -rectal bleed, hgb 6.2, requiring bld transfussion, hypotensive, requiring volume expanders.     Action/Plan:   lives at home is normal ind. in her adls   Anticipated DC Date:  01/13/2013   Anticipated DC Plan:  HOME/SELF CARE  In-house referral  NA      DC Planning Services  NA      [facility] Choice  NA   Choice offered to / List presented to:  NA   DME arranged  NA      DME agency  NA     HH arranged  NA      HH agency  NA   Status of service:  In process, will continue to follow Medicare Important Message given?  NA - LOS <3 / Initial given by admissions (If response is "NO", the following Medicare IM given date fields will be blank) Date Medicare IM given:   Date Additional Medicare IM given:    Discharge Disposition:    Per UR Regulation:  Reviewed for med. necessity/level of care/duration of stay  If discussed at Long Length of Stay Meetings, dates discussed:    Comments:  [id number] Earlene Plater, RN, BSN, CCM:  CHART REVIEWED AND UPDATED.  Next chart review due on [id number]. NO DISCHARGE NEEDS PRESENT AT THIS TIME. CASE MANAGEMENT [phone]

## 2013-01-10 NOTE — Consult Note (Signed)
WOC consult Note Reason for Consult: evaluation of leg wounds Wound type:trauma right pretibial open wound, has area of bruising on the left pretibial region, but it is not open  Measurement: 2.0cm x 0.5cm x 0.2cm  Wound bed:100% yellow Drainage (amount, consistency, odor) minimal sanguineous  Periwound: intact Dressing procedure/placement/frequency: add enzymatic debridement to clean the wound, and dry dressing. Continue silicone foam dressing for protection to the left pretibial.   Re consult if needed, will not follow at this time. Thanks  Kemaria Dedic [employer], CWOCN [phone])

## 2013-01-10 NOTE — Progress Notes (Signed)
INITIAL NUTRITION ASSESSMENT  DOCUMENTATION CODES Per approved criteria  -Underweight   INTERVENTION: Advance diet per MD discretion; recommend regular diet Add Ensure once daily when diet advanced Add Multivitamin with minerals when diet advanced  NUTRITION DIAGNOSIS: Underweight related to poor appetite as evidenced by BMI of 17.88.   Goal: Pt to meet >/= 90% of their estimated nutrition needs  Monitor:  Diet advancement/PO intake Wt Labs  Reason for Assessment: Low Braden  79 y.o. female  Admitting Dx: GI bleed  ASSESSMENT: 79 y.o. female, with past medical history significant for esophageal versus gastric ulcer status post EGD in the past presenting today with an episode of emesis that happened after supper. History goes back to around one week ago when she started noticing blood in her stools and she attributed it to hemorrhoids. Yesterday patient had epistaxis that was relieved with pressure. Patient not on blood thinners. Today she had significant emesis and felt extremely weak, she had abdominal pain that started in the left lower quadrant and went to the lower quadrant and went down her thigh on the right. Patient mildly confused due to to baseline of dementia.   Pt reports that her appetite is poor, she is "not a big eater", and she was eating one meal and a snack daily PTA. Pt denies any difficulty chewing or swallowing and states she does not follow any diet. Wt history shows that pt has maintained/gained wt over the past year.    Height: Ht Readings from Last 1 Encounters:  01/10/13 5\' 7"  (1.702 m)    Weight: Wt Readings from Last 1 Encounters:  01/10/13 114 lb 3.2 oz (51.8 kg)    Ideal Body Weight: 135 lbs  % Ideal Body Weight: 84%  Wt Readings from Last 10 Encounters:  01/10/13 114 lb 3.2 oz (51.8 kg)  12/25/12 112 lb 9.6 oz (51.075 kg)  12/10/12 112 lb (50.803 kg)  11/19/12 114 lb (51.71 kg)  11/06/12 114 lb 8 oz (51.937 kg)  10/24/12 110 lb 3.2  oz (49.986 kg)  09/26/12 113 lb (51.256 kg)  08/20/12 110 lb 6 oz (50.066 kg)  04/01/12 107 lb 9.6 oz (48.807 kg)  12/25/11 94 lb 12.8 oz (43.001 kg)    Usual Body Weight: 110 lbs  % Usual Body Weight: 104%  BMI:  Body mass index is 17.88 kg/(m^2).  Estimated Nutritional Needs: Kcal: 1250-1460 Protein: 52-73 grams Fluid: 1.6-1.9 L  Skin: +1 RLE edema, +1 LLE edema  Nutrition Focused Physical Exam:  Subcutaneous Fat:  Orbital Region: wnl Upper Arm Region: mild wasting Thoracic and Lumbar Region: NA  Muscle:  Temple Region: wnl Clavicle Bone Region: mild wasting Clavicle and Acromion Bone Region: moderate wasting Scapular Bone Region: NA Dorsal Hand: mild wasting Patellar Region: moderate wasting Anterior Thigh Region: moderate wasting Posterior Calf Region: NA  Edema: present- +1 RLE and LLE edema  Diet Order: NPO  EDUCATION NEEDS: -No education needs identified at this time   Intake/Output Summary (Last 24 hours) at 01/10/13 0932 Last data filed at 01/10/13 0914  Gross per 24 hour  Intake 1707.5 ml  Output    625 ml  Net 1082.5 ml    Last BM: 4/4  Labs:   Recent Labs Lab 01/09/13 2023  NA 140  K 5.1  CL 108  CO2 25  BUN 46*  CREATININE 0.84  CALCIUM 8.8  GLUCOSE 133*    CBG (last 3)  No results found for this basename: GLUCAP,  in the last 72  hours  Scheduled Meds: . fentaNYL  25 mcg Transdermal Q72H  . hydrocortisone sod succinate (SOLU-CORTEF) injection  50 mg Intravenous Q8H  . levofloxacin (LEVAQUIN) IV  500 mg Intravenous Q24H  . sodium chloride  3 mL Intravenous Q12H    Continuous Infusions: . sodium chloride 75 mL/hr at 01/10/13 0039  . pantoprozole (PROTONIX) infusion 8 mg/hr (01/10/13 0800)    Past Medical History  Diagnosis Date  . PMR (polymyalgia rheumatica)   . Temporal arteritis   . Osteoporosis   . Hearing loss   . Wears glasses   . Rectal bleeding   . Arthritis   . Rectal prolapse   . Hypertension   .  Mitral valve prolapse   . Spinal stenosis   . Alzheimer's dementia   . Hyperlipidemia   . GERD (gastroesophageal reflux disease)   . Sciatica   . Atrial fibrillation   . Scoliosis     Past Surgical History  Procedure Laterality Date  . Abdominal hysterectomy    . Appendectomy    . Colonoscopy    . Breast surgery      left  . Hernia repair      [facility]  . Esophagogastroduodenoscopy  11/02/2011    Procedure: ESOPHAGOGASTRODUODENOSCOPY (EGD);  Surgeon: Yancey Flemings, MD;  Location[facility] ENDOSCOPY;  Service: Endoscopy;  Laterality: N/A;  . Rectal prolapse repair, rectopexy  11/16/2010    with sigmoid colectomy.  Repair w biologic mesh in pelvis  . Cholecystectomy  12/12/11    Ian Malkin RD, LDN Inpatient Clinical Dietitian Pager: [phone] After Hours Pager: [phone]

## 2013-01-10 NOTE — Progress Notes (Signed)
Peripherally Inserted Central Catheter/Midline Placement  The IV Nurse has discussed with the patient and/or persons authorized to consent for the patient, the purpose of this procedure and the potential benefits and risks involved with this procedure.  The benefits include less needle sticks, lab draws from the catheter and patient may be discharged home with the catheter.  Risks include, but not limited to, infection, bleeding, blood clot (thrombus formation), and puncture of an artery; nerve damage and irregular heat beat.  Alternatives to this procedure were also discussed.  PICC/Midline Placement Documentation  PICC / Midline Double Lumen 01/10/13 PICC Left Brachial (Active)       Stacie Glaze Horton 01/10/2013, 5:23 PM

## 2013-01-10 NOTE — Interval H&P Note (Signed)
History and Physical Interval Note:  01/10/2013 2:06 PM  Tammy Myers  has presented today for surgery, with the diagnosis of hematemesis  The various methods of treatment have been discussed with the patient and family. After consideration of risks, benefits and other options for treatment, the patient has consented to  Procedure(s): ESOPHAGOGASTRODUODENOSCOPY (EGD) (N/A) as a surgical intervention .  The patient's history has been reviewed, patient examined, no change in status, stable for surgery.  I have reviewed the patient's chart and labs.  Questions were answered to the patient's satisfaction.     Venita Lick. Russella Dar MD Clementeen Graham

## 2013-01-10 NOTE — Plan of Care (Signed)
Problem: Phase I Progression Outcomes Goal: OOB as tolerated unless otherwise ordered Outcome: Progressing Up to stretcher with two assist.

## 2013-01-10 NOTE — Progress Notes (Signed)
Notified Dr. Mahala Menghini that the patient has lost three IV's since admission.  PICC line ordered for blood, protonix drips and continued IV access.

## 2013-01-10 NOTE — Progress Notes (Signed)
PROGRESS NOTE  Tammy Myers ZOX:[id number] DOB: 01/20/1921 DOA: 01/09/2013 PCP: [name], MD  Brief narrative: 46b yr old female admitted 01/10/13 with GIB in a setting of anticoagulation for A fib  Past medical history-As per Problem list Chart reviewed as below- Admission 3.1.13 with symptomatic cholecystitis with eventual lap chole-severe ulcerative esophagitis noted that admit as well Admission 1.23.13 c Upper GI bleed Admission 11/16/10 with rectal prolapse Admission 09/18/09 with Abd pain and warfarin coagulopathy  Consultants:  Gastroenterology Dr. Russella Dar was consulted  Procedures:  Abdominal x-ray two-view showed nonobstructive bowel gas pattern  1 view chest x-ray 01/09/2013 showed chronic bronchitic changes in the lungs with interstitial fibrosis no  Antibiotics:  Levaquin 4.3.14   Subjective  Doing fair. Somewhat confused. Not completely sure why she said however it orients well and tells me where she lives, nausea she's had hospital. Nursing reports transfusion of 1 unit packed red blood cells she tolerated well No other concerns. Patient motions and mentioned she has some abdominal pain No further dark stool report her progress    Objective    Interim History: See above  Telemetry: NSR no reds  Objective: Filed Vitals:   01/10/13 0500 01/10/13 0504 01/10/13 0600 01/10/13 0700  BP: 95/56 107/51 101/50 117/45  Pulse: 90 90 86 87  Temp:  98.1 F (36.7 C)    TempSrc:  Oral    Resp: 25 23 22 18   Height:      Weight:      SpO2: 100% 99% 100% 100%    Intake/Output Summary (Last 24 hours) at 01/10/13 0744 Last data filed at 01/10/13 0600  Gross per 24 hour  Intake   1195 ml  Output    400 ml  Net    795 ml    Exam:  General: Alert frail Caucasian female no apparent distress Cardiovascular: S1-S2 murmur left lower sternum  Respiratory: Clinically clear Abdomen: Soft nontender nondistended no rebound or guarding Skin ecchymosis over left  arm where she had transfusion Neuro grossly intact  Data Reviewed: Basic Metabolic Panel:  Recent Labs Lab 01/09/13 2023  NA 140  K 5.1  CL 108  CO2 25  GLUCOSE 133*  BUN 46*  CREATININE 0.84  CALCIUM 8.8   Liver Function Tests:  Recent Labs Lab 01/09/13 2023  AST 36  ALT 28  ALKPHOS 241*  BILITOT 0.9  PROT 5.0*  ALBUMIN 2.5*    Recent Labs Lab 01/09/13 2023  LIPASE 43   No results found for this basename: AMMONIA,  in the last 168 hours CBC:  Recent Labs Lab 01/09/13 2023 01/10/13 0205  WBC 7.3  --   NEUTROABS 5.7  --   HGB 6.8* 6.1*  HCT 20.0*  --   MCV 94.3  --   PLT 143*  --    Cardiac Enzymes: No results found for this basename: CKTOTAL, CKMB, CKMBINDEX, TROPONINI,  in the last 168 hours BNP: No components found with this basename: POCBNP,  CBG: No results found for this basename: GLUCAP,  in the last 168 hours  Recent Results (from the past 240 hour(s))  MRSA PCR SCREENING     Status: None   Collection Time    01/10/13  1:30 AM      Result Value Range Status   MRSA by PCR NEGATIVE  NEGATIVE Final   Comment:            The GeneXpert MRSA Assay (FDA     approved for NASAL specimens  only), is one component of a     comprehensive MRSA colonization     surveillance program. It is not     intended to diagnose MRSA     infection nor to guide or     monitor treatment for     MRSA infections.     Studies:              All Imaging reviewed and is as per above notation   Scheduled Meds: . fentaNYL  25 mcg Transdermal Q72H  . hydrocortisone sod succinate (SOLU-CORTEF) injection  50 mg Intravenous Q8H  . levofloxacin (LEVAQUIN) IV  500 mg Intravenous Q24H  . sodium chloride  3 mL Intravenous Q12H   Continuous Infusions: . sodium chloride 75 mL/hr at 01/10/13 0039  . pantoprozole (PROTONIX) infusion 8 mg/hr (01/10/13 0142)     Assessment/Plan: 1. GI bleed-potentially upper GI bleed secondary to gastric ulcer disease-EGD 10/2028  showed severe esophagitis with stricture. Gastroenterology has already been consulted-continue Protonix drip, keep n.p.o. for now, transfused 1 unit-3 more units to be given and repeat labs every 6 hours. 2. History of chronic bile duct dilatation, chronically elevated alkaline phosphatase-abdomen pain is chronic. She has been worked up in the past and was not candidate for ERCP given her advanced age-she does have a 9 x 7 mm cyst in the pancreatic body on MRI of the abdomen. 3. ? Ulcerative colitis/Crohn's-home meds include Canasa- will verify what the indication is for as this can cause abdominal pain and nausea and carries small risk of GI bleed as well 4. H/o Paroxsmal Afib-elevated chads score-poor candidate for anticoagulation at present.  Seems to be in NSR on tele now-appears anti-coagulation d/c in 10/2011 5. Hypertension h/o-actually mildly hypotensive currently.  Monitor 6. History of polymyalgia rheumatica and temporal arteritis-patient was started on stress dose steroids at this admission. Monitor 7. Mild confusion-patient is on Aricept-this in addition to her Harold Hedge predispose her to GI bleed. Uses this in the interim will need to be monitored closely  Code Status: Full Family Communication: None at bedside Disposition Plan: Inpatient   Pleas Koch, MD  [facility] Pager [phone] 01/10/2013, 7:44 AM    LOS: 1 day

## 2013-01-10 NOTE — Op Note (Signed)
[facility] [address] [place], [address]   ENDOSCOPY PROCEDURE REPORT  PATIENT: Tammy Myers, Tammy Myers  MR#: [id number] BIRTHDATE: [birth year] , 89  yrs. old GENDER: Female ENDOSCOPIST: Meryl Dare, MD, [facility] REFERRED BY:  [facility] PROCEDURE DATE:  01/10/2013 PROCEDURE:  EGD w/ control of bleeding and EGD w/ band ligation of varices ASA CLASS:     Class III INDICATIONS:  Hematemesis. MEDICATIONS: medications were titrated to patient response per physician's verbal order, Fentanyl 75 mcg IV, and Versed 5 mg IV TOPICAL ANESTHETIC: Cetacaine Spray DESCRIPTION OF PROCEDURE: After the risks benefits and alternatives of the procedure were thoroughly explained, informed consent was obtained.  The endoscope [id number] endoscope was introduced through the mouth and advanced to the second portion of the duodenum without limitations.  The instrument was slowly withdrawn as the mucosa was fully examined.  ESOPHAGUS: There was a focal area of active mucosal bleeding without clear etiology. Inj of 3 cc of 1:10,000 with continued bleeding. 2 clips were place at the site with continued bleeding.  Repeat evaluation of the esophagus reveal a small varix in the distal third of the esophagus which was not obvious on intial views.  The varix was actively bleeding.  Band ligation x 2 was performed with hemostasis achieved.  The esophagus was otherwise normal. STOMACH: The mucosa of the stomach appeared normal except for 5 cm hiatal hernia. DUODENUM: The duodenal mucosa showed no abnormalities in the bulb and second portion of the duodenum.  Retroflexed views revealed a 5 cm hiatal hernia.  The scope was then withdrawn from the patient and the procedure completed.  COMPLICATIONS: There were no complications.  ENDOSCOPIC IMPRESSION: 1.   Small, actively bleeding esophageal varix; band ligation x 2 was performed 2.   Moderate sized hiatal hernia  RECOMMENDATIONS: 1.   PPI IV and octreotide IV   eSigned:  Meryl Dare, MD, Clementeen Graham 01/10/2013 3:21 PM   KG:MWNU Marina Goodell, MD

## 2013-01-10 NOTE — Clinical Social Work Note (Signed)
CSW reviewed chart and left message with [facility] CSW in order to determine level of care there prior to admission. No family present and Pt not able to fully participate in assessment. CSW called [facility] and expects call to assist with level of care there. CSW to follow for return to [facility].   Doreen Salvage, LCSW ICU/Stepdown Clinical Social Worker [facility] Cell [phone] Hours 8am-1200pm M-F

## 2013-01-11 ENCOUNTER — Inpatient Hospital Stay (HOSPITAL_COMMUNITY): Payer: Medicare Other

## 2013-01-11 LAB — TYPE AND SCREEN
ABO/RH(D): A POS
Antibody Screen: POSITIVE
DAT, IgG: NEGATIVE
Donor AG Type: NEGATIVE
Donor AG Type: NEGATIVE
Donor AG Type: NEGATIVE
Donor AG Type: NEGATIVE
PT AG Type: NEGATIVE
Unit division: 0
Unit division: 0
Unit division: 0
Unit division: 0

## 2013-01-11 LAB — HEMOGLOBIN AND HEMATOCRIT, BLOOD
HCT: 32.8 % — ABNORMAL LOW (ref 36.0–46.0)
Hemoglobin: 10.9 g/dL — ABNORMAL LOW (ref 12.0–15.0)

## 2013-01-11 LAB — URINE CULTURE: Colony Count: 75000

## 2013-01-11 LAB — AFP TUMOR MARKER: AFP-Tumor Marker: 3.1 ng/mL (ref 0.0–8.0)

## 2013-01-11 MED ORDER — LEVOFLOXACIN IN D5W 250 MG/50ML IV SOLN: 250.0000 mg | INTRAVENOUS | Status: DC

## 2013-01-11 MED ORDER — LABETALOL HCL 5 MG/ML IV SOLN
2.5000 mg | Freq: Once | INTRAVENOUS | Status: AC
  Administered 2013-01-11: 2.5 mg via INTRAVENOUS
  Filled 2013-01-11: qty 4

## 2013-01-11 MED ORDER — CARVEDILOL 3.125 MG PO TABS
3.1250 mg | ORAL_TABLET | Freq: Two times a day (BID) | ORAL | Status: DC
  Administered 2013-01-11 – 2013-01-13 (×4): 3.125 mg via ORAL
  Filled 2013-01-11 (×6): qty 1

## 2013-01-11 MED ORDER — SODIUM CHLORIDE 0.9 % IJ SOLN
10.0000 mL | INTRAMUSCULAR | Status: DC | PRN
  Administered 2013-01-13 – 2013-01-14 (×2): 10 mL

## 2013-01-11 MED ORDER — PANTOPRAZOLE SODIUM 40 MG IV SOLR
40.0000 mg | Freq: Two times a day (BID) | INTRAVENOUS | Status: DC
  Administered 2013-01-12 – 2013-01-13 (×3): 40 mg via INTRAVENOUS
  Filled 2013-01-11 (×4): qty 40

## 2013-01-11 MED ORDER — PREDNISONE 5 MG PO TABS
5.0000 mg | ORAL_TABLET | Freq: Every day | ORAL | Status: DC
  Administered 2013-01-11 – 2013-01-14 (×4): 5 mg via ORAL
  Filled 2013-01-11 (×4): qty 1

## 2013-01-11 NOTE — Progress Notes (Signed)
Received pt from ICU.  She is alert and answers question appropriately.  Skin warm and dry, pink.  Respirations unlabored and regular.  Denies pain at this time.  Telemetry verified with central monitoring.  Bed alarm activated.  Call light and personal belongings within reach.  Reminded pt of how to use the call light but unsure of understanding due to dementia.  Dressings to both anterior legs dry and intact.  MPenningtonRN

## 2013-01-11 NOTE — Progress Notes (Signed)
O2 sats dropped to 81 % on room air. Had been 95-99 % on room air.  Finger nails turned dusky color.  No apparent shob or other symptoms. Patient alert. Placed on 2 liters nasal cannula and O2 sats increased to 99% quickly. Notified Dr. Mahala Menghini by text page.  Patient alert and eating lunch.  No signs of distress.

## 2013-01-11 NOTE — Progress Notes (Signed)
Patient had large dark bm which was color and consistency of raspberry jam. H/H remains stable. Will continue to monitor. Ginny Forth

## 2013-01-11 NOTE — Progress Notes (Addendum)
PROGRESS NOTE  Tammy Myers ZOX:[id number] DOB: 12-18-1920 DOA: 01/09/2013 PCP: [name], MD  Brief narrative: 5b yr old female admitted 01/10/13 with GIB in a setting of anticoagulation for A fib  Past medical history-As per Problem list Chart reviewed as below- Admission 3.1.13 with symptomatic cholecystitis with eventual lap chole-severe ulcerative esophagitis noted that admit as well Admission 1.23.13 c Upper GI bleed Admission 11/16/10 with rectal prolapse Admission 09/18/09 with Abd pain and warfarin coagulopathy  Consultants:  Gastroenterology Dr. Russella Dar was consulted  Procedures:  Abdominal x-ray two-view showed nonobstructive bowel gas pattern  1 view chest x-ray 01/09/2013 showed chronic bronchitic changes in the lungs with interstitial fibrosis no  Antibiotics:  Levaquin 4.3.14   Subjective  Doing well.m  Some repetition.  No specific complaints-  Tolerated PO liquids well.  No N/v Nursing reports no specific issues currently    Objective    Interim History: See above  Telemetry: NSR no reds  Objective: Filed Vitals:   01/11/13 0400 01/11/13 0500 01/11/13 0600 01/11/13 0700  BP: 168/78 178/79 187/80 132/64  Pulse: 87 83 91 69  Temp: 97.9 F (36.6 C)     TempSrc: Oral     Resp: 16 16 22 17   Height:      Weight: 52.4 kg (115 lb 8.3 oz)     SpO2: 97% 99% 96% 97%    Intake/Output Summary (Last 24 hours) at 01/11/13 0751 Last data filed at 01/11/13 0500  Gross per 24 hour  Intake 2407.5 ml  Output   1950 ml  Net  457.5 ml    Exam:  General: Alert frail Caucasian female no apparent distress Cardiovascular: S1-S2 murmur left lower sternum  Respiratory: Clinically clear Abdomen: Soft nontender nondistended-some firm masses flet in lower abdomen Skin ecchymosis over left arm where she had transfusion Neuro grossly intact  Data Reviewed: Basic Metabolic Panel:  Recent Labs Lab 01/09/13 2023  NA 140  K 5.1  CL 108  CO2 25   GLUCOSE 133*  BUN 46*  CREATININE 0.84  CALCIUM 8.8   Liver Function Tests:  Recent Labs Lab 01/09/13 2023  AST 36  ALT 28  ALKPHOS 241*  BILITOT 0.9  PROT 5.0*  ALBUMIN 2.5*    Recent Labs Lab 01/09/13 2023  LIPASE 43   No results found for this basename: AMMONIA,  in the last 168 hours CBC:  Recent Labs Lab 01/09/13 2023 01/10/13 0205 01/10/13 2328 01/11/13 0526  WBC 7.3  --   --   --   NEUTROABS 5.7  --   --   --   HGB 6.8* 6.1* 11.3* 11.6*  HCT 20.0*  --  33.2* 33.9*  MCV 94.3  --   --   --   PLT 143*  --   --   --    Cardiac Enzymes: No results found for this basename: CKTOTAL, CKMB, CKMBINDEX, TROPONINI,  in the last 168 hours BNP: No components found with this basename: POCBNP,  CBG: No results found for this basename: GLUCAP,  in the last 168 hours  Recent Results (from the past 240 hour(s))  MRSA PCR SCREENING     Status: None   Collection Time    01/10/13  1:30 AM      Result Value Range Status   MRSA by PCR NEGATIVE  NEGATIVE Final   Comment:            The GeneXpert MRSA Assay (FDA     approved for NASAL  specimens     only), is one component of a     comprehensive MRSA colonization     surveillance program. It is not     intended to diagnose MRSA     infection nor to guide or     monitor treatment for     MRSA infections.     Studies:              All Imaging reviewed and is as per above notation   Scheduled Meds: . collagenase   Topical Daily  . fentaNYL  25 mcg Transdermal Q72H  . hydrocortisone sod succinate (SOLU-CORTEF) injection  50 mg Intravenous Q8H  . levofloxacin (LEVAQUIN) IV  250 mg Intravenous Q24H  . pantoprazole (PROTONIX) IV  40 mg Intravenous Q12H  . sodium chloride  10-40 mL Intracatheter Q12H  . sodium chloride  3 mL Intravenous Q12H   Continuous Infusions: . sodium chloride 10 mL/hr (01/10/13 1116)  . octreotide (SANDOSTATIN) infusion 25 mcg/hr (01/10/13 2000)     Assessment/Plan: 1. GI  bleed-potentially upper GI bleed secondary to gastric ulcer disease-EGD 01/11/12=small bleeding varix-band ligated x 2 continue Protonix 40 q12, Octreotide 2 mcg/ml-PICC line placed for 01/10/2013 given friable veins 2. Esophageal varix-probably a result of portal HTN-likely would benefit from beta-blockade to prevent bleeding 3. History of chronic bile duct dilatation, chronically elevated alkaline phosphatase-abdomen pain is chronic. She has been worked up in the past and was not candidate for ERCP given her advanced age-she does have a 9 x 7 mm cyst in the pancreatic body on MRI of the abdomen-AFP and Ultrasound ordered by GI 4.5.14 4. ? Ulcerative colitis/Crohn's-home meds include Canasa- will verify what the indication is for as this can cause abdominal pain and nausea and carries small risk of GI bleed as well 5. History of internal hemorrhoids-recently evaluated and sclerosed 6. H/o Paroxsmal Afib-elevated chads score-poor candidate for anticoagulation at present.  Seems to be in NSR on tele now-appears anti-coagulation d/c in 10/2011 7. Hypertension h/o-patient now hypertensive once again-added Coreg 3.125 bid. Continue labetalol 2.5 q. 6 when necessary up at 160 over and 8. History of polymyalgia rheumatica and temporal arteritis-Transition to home doses of steroids prednisone 5 mg 9. Mild confusion-patient is on Aricept-Uses this in the interim will need to be monitored closely  Code Status: Full Family Communication: None at bedside Disposition Plan: Inpatient   Pleas Koch, MD  [facility] Pager [phone] 01/11/2013, 7:51 AM    LOS: 2 days

## 2013-01-11 NOTE — Progress Notes (Signed)
Report called to Marcelino Duster, Charity fundraiser.  Transferring to room 1424.  Sister called.  Notified receiving nurse that patient is NOT to have an MRI for a month due to clips placed by GI during EGD.  Also patient is to be NPO after midnight for ultra sound in the am.

## 2013-01-11 NOTE — Progress Notes (Signed)
Patient ID: Tammy Myers, female   DOB: 1921/04/18, 79 y.o.   MRN: [id number] [facility] Progress Note  Subjective: Brighter today- no c/o except asking for toast. Denies chest or abdominal pain. HGB up to 11.6 post 4 units  Objective:  Vital signs in last 24 hours: Temp:  [97.4 F (36.3 C)-99.1 F (37.3 C)] 97.5 F (36.4 C) (04/05 0800) Pulse Rate:  [69-97] 69 (04/05 0700) Resp:  [12-35] 17 (04/05 0700) BP: (113-193)/(47-110) 132/64 mmHg (04/05 0700) SpO2:  [93 %-100 %] 95 % (04/05 0800) Weight:  [115 lb 8.3 oz (52.4 kg)] 115 lb 8.3 oz (52.4 kg) (04/05 0400) Last BM Date: 01/10/13 General:   Alert,  Well-developed,  Elderly WF   in NAD Heart:  Regular rate and rhythm; harsh syst murmur Pulm;clear bilat ant Abdomen:  Soft, nontender and nondistended. Normal bowel sounds, without guarding, and without rebound.   Extremities:  Without edema. Neurologic:  Alert and  oriented x2; pleasantly confused this am Psych:  Alert and cooperative.   Intake/Output from previous day: 04/04 0701 - 04/05 0700 In: 2420 [P.O.:150; I.V.:795; Blood:1475] Out: 1950 [Urine:1950] Intake/Output this shift: Total I/O In: 12.5 [I.V.:12.5] Out: -   Lab Results:  Recent Labs  01/09/13 2023 01/10/13 0205 01/10/13 2328 01/11/13 0526  WBC 7.3  --   --   --   HGB 6.8* 6.1* 11.3* 11.6*  HCT 20.0*  --  33.2* 33.9*  PLT 143*  --   --   --    BMET  Recent Labs  01/09/13 2023  NA 140  K 5.1  CL 108  CO2 25  GLUCOSE 133*  BUN 46*  CREATININE 0.84  CALCIUM 8.8   LFT  Recent Labs  01/09/13 2023  PROT 5.0*  ALBUMIN 2.5*  AST 36  ALT 28  ALKPHOS 241*  BILITOT 0.9   PT/INR  Recent Labs  01/09/13 2023  LABPROT 14.7  INR 1.17    Assessment / Plan: #1  79 yo female with acute variceal bleed-stable s/p EGD and banding yesterday Continue Octreotide infusion x 48 hours total Continue BID PPI Advance to full liquid diet She will need repeat EGD in 2 weeks for  repeat banding-can arrange with Dr. Marina Goodell #2 Anemia- much improved post transfusions #3 Cirrhosis etiology not clear- prior US and MRI last fall suggested fatty liver/possible cirrhosis Will repeat US, check alpha feto protein level-not sure would pursue any further workup as to etiology at her advanced age. She did have some serologic markers done previously with weakly + ANA #4 ? Aortic stenosis #5 Dementia  Principal Problem:   GI bleed Active Problems:   Hematemesis   Epistaxis   Esophageal varices with hemorrhage   LOS: 2 days   Amy Esterwood [facility] 01/11/2013, 8:46 AM   I have taken an interval history, reviewed the chart and examined the patient. I agree with the Advanced Practitioner's note, impression and recommendations.   Venita Lick. Russella Dar MD [facility]

## 2013-01-12 ENCOUNTER — Inpatient Hospital Stay (HOSPITAL_COMMUNITY): Payer: Medicare Other

## 2013-01-12 DIAGNOSIS — R7989 Other specified abnormal findings of blood chemistry: Secondary | ICD-10-CM

## 2013-01-12 LAB — BASIC METABOLIC PANEL
BUN: 27 mg/dL — ABNORMAL HIGH (ref 6–23)
CO2: 28 mEq/L (ref 19–32)
Chloride: 107 mEq/L (ref 96–112)
GFR calc non Af Amer: 79 mL/min — ABNORMAL LOW (ref >90)
Glucose, Bld: 104 mg/dL — ABNORMAL HIGH (ref 70–99)
Potassium: 3.5 mEq/L (ref 3.5–5.1)
Sodium: 139 mEq/L (ref 135–145)

## 2013-01-12 LAB — HEMOGLOBIN AND HEMATOCRIT, BLOOD: HCT: 33.8 % — ABNORMAL LOW (ref 36.0–46.0)

## 2013-01-12 MED ORDER — TRAMADOL HCL 50 MG PO TABS
50.0000 mg | ORAL_TABLET | Freq: Two times a day (BID) | ORAL | Status: DC | PRN
  Administered 2013-01-12 – 2013-01-13 (×2): 50 mg via ORAL
  Filled 2013-01-12 (×2): qty 1

## 2013-01-12 NOTE — Evaluation (Signed)
Physical Therapy Evaluation Patient Details Name: Tammy Myers MRN: [id number] DOB: 21-Mar-1921 Today's Date: 01/12/2013 Time: 6578-4696 PT Time Calculation (min): 14 min  PT Assessment / Plan / Recommendation Clinical Impression  79 y.o. female admitted with GIB. At baseline she is independent with mobility at [facility] independent living. Today she required +2 assist for bed to chair transfer. SOB noted with activity, SaO2 88% on RA, 95% on 2L O2. She would benefit from acute PT to maximize safety and independence with mobility. SNF recommended.     PT Assessment  Patient needs continued PT services    Follow Up Recommendations  SNF    Does the patient have the potential to tolerate intense rehabilitation      Barriers to Discharge        Equipment Recommendations  Rolling walker with 5" wheels    Recommendations for Other Services OT consult   Frequency Min 3X/week    Precautions / Restrictions Precautions Precautions: Fall Restrictions Weight Bearing Restrictions: No   Pertinent Vitals/Pain **SaO2 88% on RA with activity, 95% after 1 minute on 2L O2 Pt denied pain*      Mobility  Bed Mobility Bed Mobility: Left Sidelying to Sit Left Sidelying to Sit: 2: Max assist Details for Bed Mobility Assistance: assist to elevate trunk Transfers Transfers: Sit to Stand;Stand to Sit;Stand Pivot Transfers Sit to Stand: 1: +2 Total assist Sit to Stand: Patient Percentage: 30% Stand to Sit: 1: +2 Total assist Stand to Sit: Patient Percentage: 60% Stand Pivot Transfers: 1: +2 Total assist Stand Pivot Transfers: Patient Percentage: 40% Details for Transfer Assistance: assist to rise and steady, assist for balance during pivot Ambulation/Gait Ambulation/Gait Assistance: Not tested (comment) Ambulation/Gait Assistance Details: deferred 2* SOB with bed to chair transfer    Exercises     PT Diagnosis: Difficulty walking;Generalized weakness  PT Problem List: Decreased  strength;Decreased activity tolerance;Decreased mobility;Cardiopulmonary status limiting activity;Decreased knowledge of use of DME PT Treatment Interventions: Gait training;Functional mobility training;Therapeutic exercise;Therapeutic activities;DME instruction;Patient/family education   PT Goals Acute Rehab PT Goals PT Goal Formulation: With patient Time For Goal Achievement: 01/26/13 Pt will go Supine/Side to Sit: with min assist PT Goal: Supine/Side to Sit - Progress: Goal set today Pt will go Sit to Stand: with min assist PT Goal: Sit to Stand - Progress: Goal set today Pt will Transfer Bed to Chair/Chair to Bed: with min assist PT Transfer Goal: Bed to Chair/Chair to Bed - Progress: Goal set today Pt will Ambulate: 16 - 50 feet;with min assist;with least restrictive assistive device PT Goal: Ambulate - Progress: Goal set today  Visit Information  Last PT Received On: 01/12/13 Assistance Needed: +2    Subjective Data  Subjective: I was independent at [facility].  Patient Stated Goal: return to independence   Prior Functioning  Home Living Type of Home: Assisted living Home Access: Level entry Home Layout: One level Additional Comments: walks without AD at ALF Prior Function Level of Independence: Independent Communication Communication: HOH (wears hearing aids)    Cognition  Cognition Overall Cognitive Status: Appears within functional limits for tasks assessed/performed Arousal/Alertness: Awake/alert Orientation Level: Appears intact for tasks assessed Behavior During Session: [facility] for tasks performed    Extremity/Trunk Assessment Right Upper Extremity Assessment RUE ROM/Strength/Tone: [facility] for tasks assessed Left Upper Extremity Assessment LUE ROM/Strength/Tone: [facility] for tasks assessed Right Lower Extremity Assessment RLE ROM/Strength/Tone: WFL for tasks assessed RLE Sensation: WFL - Light Touch RLE Coordination: WFL - gross/fine motor Left Lower Extremity  Assessment LLE ROM/Strength/Tone: WFL for tasks assessed LLE Sensation: WFL - Light Touch LLE Coordination: WFL - gross/fine motor Trunk Assessment Trunk Assessment: Kyphotic   Balance Balance Balance Assessed: Yes Static Sitting Balance Static Sitting - Balance Support: Feet supported Static Sitting - Level of Assistance: 5: Stand by assistance Static Sitting - Comment/# of Minutes: 1  End of Session PT - End of Session Activity Tolerance: Patient limited by fatigue Patient left: in chair;with call bell/phone within reach;with chair alarm set;with nursing in room Nurse Communication: Mobility status  GP     Ralene Bathe Kistler 01/12/2013, 2:55 PM [phone]

## 2013-01-12 NOTE — Progress Notes (Signed)
Patient ID: Tammy Myers, female   DOB: Mar 08, 1921, 79 y.o.   MRN: [id number] [facility] Progress Note  Subjective: Says her abdomen hurts a little bit. Asking for breakfast. Just completing bedside upper Abdominal US. No Bm's today- had a dark bloody stool last pm  AFP = 3.1 HGB 10.9   Objective:  Vital signs in last 24 hours: Temp:  [97.5 F (36.4 C)-98.3 F (36.8 C)] 98.3 F (36.8 C) (04/06 0548) Pulse Rate:  [70-89] 82 (04/06 0548) Resp:  [14-26] 16 (04/06 0548) BP: (126-167)/(63-87) 142/74 mmHg (04/06 0548) SpO2:  [81 %-99 %] 96 % (04/06 0548) Last BM Date: 01/12/13 General:   Alert,  Well-developed,elderly WF    in NAD. HOH Heart:  Regular rate and rhythm;harsh systolic murmur Pulm;clear Abdomen:  Soft,  No focal tenderness and nondistended. Normal bowel sounds, without guarding, and without rebound.   Extremities:  Without edema. Neurologic:  Alert and  oriented x2  Pleasantly demented Psych:  Alert and cooperative.   Intake/Output from previous day: 04/05 0701 - 04/06 0700 In: 1197.5 [P.O.:450; I.V.:747.5] Out: 425 [Urine:425] Intake/Output this shift: Total I/O In: -  Out: 250 [Urine:250]  Lab Results:  Recent Labs  01/09/13 2023  01/11/13 0526 01/11/13 0920 01/11/13 2057  WBC 7.3  --   --   --   --   HGB 6.8*  < > 11.6* 11.3* 10.9*  HCT 20.0*  < > 33.9* 33.5* 32.8*  PLT 143*  --   --   --   --   < > = values in this interval not displayed. BMET  Recent Labs  01/09/13 2023  NA 140  K 5.1  CL 108  CO2 25  GLUCOSE 133*  BUN 46*  CREATININE 0.84  CALCIUM 8.8   LFT  Recent Labs  01/09/13 2023  PROT 5.0*  ALBUMIN 2.5*  AST 36  ALT 28  ALKPHOS 241*  BILITOT 0.9   PT/INR  Recent Labs  01/09/13 2023  LABPROT 14.7  INR 1.17   Assessment / Plan: #1 79 yo female with acute GI bleed secondary to esophageal varix- day #2 post EGD with banding- no active  Bleeding Continue Octreotide today, then wean off  tomorrow Continue PPI Soft diet Pt will need scheduled for repeat EGD with banding with Dr. Marina Goodell in 2 weeks #2 Anemia- profound- stable post transfusions x4 #3 dementia #4 new dx of cirrhosis- AFP normal, [place] pending. Serologic markers last fall negative except  Weakly + ANA- Cyrptogenic #5 ?aortic stenosis  Principal Problem:   GI bleed Active Problems:   Hematemesis   Epistaxis   Esophageal varices with hemorrhage   LOS: 3 days   Amy Esterwood  01/12/2013, 9:43 AM    I have taken an interval history, reviewed the chart and examined the patient. I agree with the Advanced Practitioner's note, impression and recommendations. No recurrent bleeding post variceal banding on 4/4. Hb has remained stable. Abd MRI in 08/2012 showed a nodular liver, possible cirrhosis, and a dilated CBD to 15 mm tapering at ampulla. Alk phos remains elevated. ANA positive at 1:80 and AMA negative last year. Abd [place] pending.  Venita Lick. Russella Dar MD [facility]

## 2013-01-12 NOTE — Progress Notes (Signed)
PROGRESS NOTE  Tammy Myers ZOX:[id number] DOB: 1921-02-14 DOA: 01/09/2013 PCP: [name], MD  Brief narrative: 71b yr old female with h/o Severe Ulcerative colitis in 12/08/11, h/o chronic abdominal pain with ANA + 1:80, MRI with Nodular liver/cirrhosis and dilated CBD admitted 01/10/13 with GIB in a setting of anticoagulation for A fib.  She underwent endoscopy 01/10/13 showing bleeding esophageal varix which was band ligated and was palced on Octretide Gtt and PPI IV and kept on full liquids  Past medical history-As per Problem list Chart reviewed as below- Admission 3.1.13 with symptomatic cholecystitis with eventual lap chole-severe ulcerative esophagitis noted that admit as well Admission 1.23.13 c Upper GI bleed Admission 11/16/10 with rectal prolapse Admission 09/18/09 with Abd pain and warfarin coagulopathy  Consultants:  Gastroenterology Dr. Russella Dar was consulted  Procedures:  Abdominal x-ray two-view showed nonobstructive bowel gas pattern  1 view chest x-ray 01/09/2013 showed chronic bronchitic changes in the lungs with interstitial fibrosis  ENDOSCOPIC IMPRESSION 01/10/13: 1. Small, actively bleeding esophageal varix; band ligation x 2 was performed 2. Moderate sized hiatal hernia  Antibiotics:  Levaquin 4.3.14   Subjective  Doing well.m  Some repetition.  No specific complaints-I attempt to relay her medica concerns to her multiples times but she has no clear understanding Tolerated PO liquids well.  No N/v Nursing reports some dark stool noted last pm with jam like consistency    Objective    Interim History: See above  Telemetry: NSR no reds  Objective: Filed Vitals:   01/11/13 2215 01/12/13 0548 01/12/13 1448 01/12/13 1500  BP: 147/71 142/74  137/71  Pulse: 83 82  68  Temp: 97.5 F (36.4 C) 98.3 F (36.8 C)  97.9 F (36.6 C)  TempSrc: Oral Oral  Oral  Resp: 16 16  18   Height:      Weight:      SpO2: 95% 96% 88% 97%    Intake/Output Summary  (Last 24 hours) at 01/12/13 1558 Last data filed at 01/12/13 1500  Gross per 24 hour  Intake 1382.5 ml  Output    475 ml  Net  907.5 ml    Exam:  General: Alert frail Caucasian female no apparent distress Cardiovascular: S1-S2 murmur left lower sternum  Respiratory: Clinically clear Abdomen: Soft nontender nondistended-some firm masses flet in lower abdomen Skin ecchymosis over left arm where she had transfusion Neuro grossly intact  Data Reviewed: Basic Metabolic Panel:  Recent Labs Lab 01/09/13 2023 01/12/13 0930  NA 140 139  K 5.1 3.5  CL 108 107  CO2 25 28  GLUCOSE 133* 104*  BUN 46* 27*  CREATININE 0.84 0.58  CALCIUM 8.8 7.6*   Liver Function Tests:  Recent Labs Lab 01/09/13 2023  AST 36  ALT 28  ALKPHOS 241*  BILITOT 0.9  PROT 5.0*  ALBUMIN 2.5*    Recent Labs Lab 01/09/13 2023  LIPASE 43   No results found for this basename: AMMONIA,  in the last 168 hours CBC:  Recent Labs Lab 01/09/13 2023  01/10/13 2328 01/11/13 0526 01/11/13 0920 01/11/13 2057 01/12/13 0857  WBC 7.3  --   --   --   --   --   --   NEUTROABS 5.7  --   --   --   --   --   --   HGB 6.8*  < > 11.3* 11.6* 11.3* 10.9* 11.2*  HCT 20.0*  --  33.2* 33.9* 33.5* 32.8* 33.8*  MCV 94.3  --   --   --   --   --   --  PLT 143*  --   --   --   --   --   --   < > = values in this interval not displayed. Cardiac Enzymes: No results found for this basename: CKTOTAL, CKMB, CKMBINDEX, TROPONINI,  in the last 168 hours BNP: No components found with this basename: POCBNP,  CBG: No results found for this basename: GLUCAP,  in the last 168 hours  Recent Results (from the past 240 hour(s))  URINE CULTURE     Status: None   Collection Time    01/09/13 10:35 PM      Result Value Range Status   Specimen Description URINE, CLEAN CATCH   Final   Special Requests NONE   Final   Culture  Setup Time 01/10/2013 03:25   Final   Colony Count 75,000 COLONIES/ML   Final   Culture     Final    Value: Multiple bacterial morphotypes present, none predominant. Suggest appropriate recollection if clinically indicated.   Report Status 01/11/2013 FINAL   Final  MRSA PCR SCREENING     Status: None   Collection Time    01/10/13  1:30 AM      Result Value Range Status   MRSA by PCR NEGATIVE  NEGATIVE Final   Comment:            The GeneXpert MRSA Assay (FDA     approved for NASAL specimens     only), is one component of a     comprehensive MRSA colonization     surveillance program. It is not     intended to diagnose MRSA     infection nor to guide or     monitor treatment for     MRSA infections.     Studies:              All Imaging reviewed and is as per above notation   Scheduled Meds: . carvedilol  3.125 mg Oral BID WC  . collagenase   Topical Daily  . fentaNYL  25 mcg Transdermal Q72H  . pantoprazole (PROTONIX) IV  40 mg Intravenous BID  . predniSONE  5 mg Oral Daily  . sodium chloride  10-40 mL Intracatheter Q12H  . sodium chloride  3 mL Intravenous Q12H   Continuous Infusions: . sodium chloride 20 mL/hr at 01/12/13 0930  . octreotide (SANDOSTATIN) infusion 25 mcg/hr (01/12/13 1115)     Assessment/Plan: 1. GI bleed-potentially upper GI bleed secondary to gastric ulcer disease-EGD 01/11/12=small bleeding varix-band ligated x 2 continue Protonix 40 q12, Octreotide 2 mcg/ml-PICC line placed for 01/10/2013 given friable veins 2. Esophageal varix-probably a result of portal HTN-coreg 3.125 commenced 4.5.14 for 2/2 prevention Variceal bleed 3. History of chronic bile duct dilatation, chronically elevated alkaline phosphatase-abdomen pain is chronic. She has been worked up in the past and was not candidate for ERCP given her advanced age-she does have a 9 x 7 mm cyst in the pancreatic body on MRI of the abdomen-AFP negative- Ultrasound ordered by GI 4.5.14-Report still pending 4. 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  Her ANA + 1:80 speckled in the past and AMA was neg which rules out Primary biliary cirrhosis-Will repeat studies including ANA/AMA/ASMA/Iron studies and Hepatitis panel[see orders]--would defer  Alpha1 antitrypisn as at her age this is unlikely a cause 5. ? Ulcerative colitis/Crohn's 6. History of internal hemorrhoids-recently evaluated and sclerosed 7. H/o Paroxsmal Afib-elevated chads score-poor candidate for anticoagulation  at present.  Seems to be in NSR on tele now-appears anti-coagulation d/c in 10/2011 8. Hypertension h/o-patient now hypertensive once again-added Coreg 3.125 bid. Continue labetalol 2.5 q. 6 when necessary up at 160 over and 9. History of polymyalgia rheumatica and temporal arteritis-Transition to home doses of steroids prednisone 5 mg 10. Mild confusion-patient is on Aricept-Uses this in the interim will need to be monitored closely  Code Status: Full Family Communication: discussed course of care with brother -in-law at bedside Disposition Plan: Needs SNF when medically stable-probably in 2-3 days   Pleas Koch, MD  [facility] Pager [phone] 01/12/2013, 3:58 PM    LOS: 3 days

## 2013-01-13 ENCOUNTER — Encounter (HOSPITAL_COMMUNITY): Payer: Self-pay | Admitting: Gastroenterology

## 2013-01-13 LAB — CERULOPLASMIN: Ceruloplasmin: 39 mg/dL (ref 20–60)

## 2013-01-13 LAB — HCV RNA QUANT: HCV Quantitative: NOT DETECTED IU/mL — ABNORMAL LOW (ref <15)

## 2013-01-13 LAB — MITOCHONDRIAL ANTIBODIES: Mitochondrial M2 Ab, IgG: 0.12 (ref <0.91)

## 2013-01-13 LAB — HEPATITIS B CORE ANTIBODY, TOTAL: Hep B Core Total Ab: NEGATIVE

## 2013-01-13 LAB — ANTI-SMOOTH MUSCLE ANTIBODY, IGG: F-Actin IgG: 6 U (ref <20)

## 2013-01-13 MED ORDER — NADOLOL 40 MG PO TABS
40.0000 mg | ORAL_TABLET | Freq: Every day | ORAL | Status: DC
  Administered 2013-01-13 – 2013-01-14 (×2): 40 mg via ORAL
  Filled 2013-01-13 (×2): qty 1

## 2013-01-13 MED ORDER — ADULT MULTIVITAMIN W/MINERALS CH
1.0000 | ORAL_TABLET | Freq: Every day | ORAL | Status: DC
  Administered 2013-01-13 – 2013-01-14 (×2): 1 via ORAL
  Filled 2013-01-13 (×2): qty 1

## 2013-01-13 MED ORDER — HYDROCORTISONE 2.5 % RE CREA
TOPICAL_CREAM | Freq: Three times a day (TID) | RECTAL | Status: DC
  Administered 2013-01-13 – 2013-01-14 (×4): via RECTAL
  Filled 2013-01-13: qty 28.35

## 2013-01-13 MED ORDER — PANTOPRAZOLE SODIUM 40 MG PO TBEC
40.0000 mg | DELAYED_RELEASE_TABLET | Freq: Every day | ORAL | Status: DC
  Administered 2013-01-14: 40 mg via ORAL
  Filled 2013-01-13: qty 1

## 2013-01-13 MED ORDER — ENSURE COMPLETE PO LIQD
237.0000 mL | ORAL | Status: DC
  Administered 2013-01-13 – 2013-01-14 (×2): 237 mL via ORAL

## 2013-01-13 NOTE — Progress Notes (Signed)
[facility] Progress Note    Since last GI note:  Had dripping bright red blood from rectum this AM during therapy.  I examined her shortly afterwards and there was small amount of red blood on pad, clear medium sized external hemorrhoids.  Rectal exam found red blood, no stool, no clear distal rectal masses.  She has had hemorrhoidal banding in 2013 and has been seen as recently as 3 months ago at surgery office for hemorrhoids that intermittently hurt, bleed.  I cannot find any documentation of previous colonoscopy or flex sig in EPIC chart. Variety of GI care givers including Dr. Elnoria Howard whose office  I called but they have no record of previous lower endoscopic procedures.  Reviewing her biliary history:  She had GB removal by Dr. Michaell Cowing 12/2011 at which time she had dilated CBD 14mm, IOC shows beaked appearance of distal duct with prompt filling into the duodenal.  No filling defects. Several imaging tests since then have shown no clear pancreatic mass (MR, [place]).  She has nodular liver on previous imaging and also [place] this admission, cirrhosis.  Unclear etiology however at her age of 3 it probably isn't a very important clinical question.  Hep C ab negative, Hep B core Ab negative, Hep B Surface Ag negative, AFP normal. ANA + 1;80 titer 2013, AMA negative 2013  Objective: Vital signs in last 24 hours: Temp:  [97.9 F (36.6 C)-98.4 F (36.9 C)] 98.4 F (36.9 C) (04/07 0459) Pulse Rate:  [68-78] 78 (04/07 0459) Resp:  [16-20] 20 (04/07 0459) BP: (135-176)/(68-74) 142/74 mmHg (04/07 0459) SpO2:  [88 %-97 %] 94 % (04/07 0459) Last BM Date: 01/12/13 General: alert and oriented times 1 or possibly 2 Heart: regular rate and rythm Abdomen: soft, non-tender, non-distended, normal bowel sounds Rectal exam with female RN in room; there was small amount of red blood on pad, clear medium sized external hemorrhoids.  Rectal exam found red blood, no stool, no clear distal rectal  masses.  Lab Results:  Recent Labs  01/12/13 0857 01/12/13 2040 01/13/13 0830  HGB 11.2* 11.2* 10.9*    Recent Labs  01/12/13 0930  NA 139  K 3.5  CL 107  CO2 28  GLUCOSE 104*  BUN 27*  CREATININE 0.58  CALCIUM 7.6*   Studies/Results: US Abdomen Complete  01/13/2013  *RADIOLOGY REPORT*  Clinical Data:  Abdominal pain.  COMPLETE ABDOMINAL ULTRASOUND  Comparison:  Two-view abdomen 01/09/2013.  Abdominal ultrasound 12/28/2011.  Findings:  Gallbladder:  Status post cholecystectomy.  Common bile duct:  The common bile duct is markedly enlarged. Previous MRI suggested tapering and possible distal obstruction. The diameter has increased, now measuring 15.5 mm.  Liver:  Liver is diffusely heterogeneous.  The liver edges irregular, compatible with cirrhosis.  IVC:  Appears normal.  Pancreas:  The pancreas is heterogeneous and somewhat thickened. No discrete lesion is seen.  Spleen:  Normal size and echotexture without focal parenchymal abnormality.The maximal length is 5.5 cm, within normal limits.  Right Kidney:  Mild cortical thinning is present.  No discrete lesions are present.  There is no significant change.  Maximal length is 10.4 cm, within normal limits.  Left Kidney:  Mild cortical thinning is present.  No discrete lesions are present.  There is no significant change.  Maximal length is 11.0 cm, within normal limits.  Abdominal aorta:  Maximal diameters maximal diameter is 2.1 cm, within normal limits.  Atherosclerotic irregularities are noted.  Moderate abdominal ascites is increased.  Bilateral pleural  effusions are evident.  IMPRESSION:  1.  Cirrhotic changes of the liver. 2.  Increasing size of the common bile duct.  No obstructing lesion is evident. 3.  Atherosclerosis. 4.  Moderate abdominal ascites has increased. 5.  Bilateral pleural 5.  Bilateral pleural effusions. effusions.   Original Report Authenticated By: Marin Roberts, M.D.     Medications: Scheduled Meds: .  carvedilol  3.125 mg Oral BID WC  . collagenase   Topical Daily  . fentaNYL  25 mcg Transdermal Q72H  . pantoprazole (PROTONIX) IV  40 mg Intravenous BID  . predniSONE  5 mg Oral Daily  . sodium chloride  10-40 mL Intracatheter Q12H  . sodium chloride  3 mL Intravenous Q12H   Continuous Infusions: . sodium chloride 20 mL/hr at 01/12/13 0930  . octreotide (SANDOSTATIN) infusion 25 mcg/hr (01/13/13 0724)   PRN Meds:.morphine injection, ondansetron (ZOFRAN) IV, ondansetron, sodium chloride, sodium chloride, traMADol    Assessment/Plan: 79 y.o. female with cirrhosis, recent resolved variceal bleeding, dilated CBD, now with minor rectal bleeding  See discussion of Biliary dilation above.  I don't think further dedicated testing is warranted for biliary dilation given her age, comorbid conditions, results of previous imaging tests, decreasing trend of alk phos (was 550 a year ago, now is 240).  Will advance diet and stop octreotide for her variceal bleeding.  I would like to start nadolol 40mg  once daily, but she is already on BB, ask that hospitalist comment and consider stopping carvedilol or nadolol (has been shown to decrease rebleeding rates from varices).  Her rectal bleeding has been a problem for over a year, attributed to hemorrhoids. I saw clear hemorrhoids on exam today so that is probably truly the case.  I cannot find previous lower endoscopic evals and will consider flex sig if restarting anusol (I ordered) tid does not help.  Stopping IV ppi, starting oral PPI once daily.    Rob Bunting, MD  01/13/2013, 8:56 AM [facility] Pager [phone]

## 2013-01-13 NOTE — Progress Notes (Signed)
Physical Therapy Treatment Patient Details Name: Tammy Myers MRN: [id number] DOB: 09-Apr-1921 Today's Date: 01/13/2013 Time: 4098-1191 PT Time Calculation (min): 27 min  PT Assessment / Plan / Recommendation Comments on Treatment Session  Pt requires increased assist to get to EOB today with max cues for upright posture.  Assisted pt to 3in1 and stood several times to assist with self care.  Performed all mobility on 3L O2 with SaO2 at 95% prior to mobility and 92% following.     Follow Up Recommendations  SNF     Does the patient have the potential to tolerate intense rehabilitation     Barriers to Discharge        Equipment Recommendations  Rolling walker with 5" wheels    Recommendations for Other Services    Frequency Min 3X/week   Plan Discharge plan remains appropriate    Precautions / Restrictions Precautions Precautions: Fall Precaution Comments: Monitor O2 Restrictions Weight Bearing Restrictions: No   Pertinent Vitals/Pain Pt states she has pain around rectum.  RN notified of bright red blood coming from rectum.     Mobility  Bed Mobility Bed Mobility: Supine to Sit Left Sidelying to Sit: 2: Max assist Supine to Sit: 2: Max assist Details for Bed Mobility Assistance: Pt requires max assist for trunk and legs to get to EOB due to severe posterior leaning.  Max cues for hand placement and technique.  Transfers Transfers: Sit to Stand;Stand to Sit;Stand Pivot Transfers Sit to Stand: 3: Mod assist;With upper extremity assist;From bed;From chair/3-in-1;From toilet Stand to Sit: 3: Mod assist;With upper extremity assist;To toilet;To chair/3-in-1 Stand Pivot Transfers: 1: +2 Total assist Stand Pivot Transfers: Patient Percentage: 50% Details for Transfer Assistance: Assist to rise, steady and ensure controlled descent with cues for hand placement and safety.  Performed several times in order to assist with cleaning and again for fecal incontinence.  Noted bright  red blood when assisting with cleaning.  RN and MD notified.  She was able to take some steps from 3in1 to recliner with assist to steady and negotiate RW and cues for sequencing/technique as well.  Ambulation/Gait Ambulation/Gait Assistance: 1: +2 Total assist Ambulation/Gait: Patient Percentage: 50% Ambulation Distance (Feet): 4 Feet Assistive device: Rolling walker Ambulation/Gait Assistance Details: Again, she requires cues for sequencing/technique with RW as well as assist to steady and help negotiate RW.  Gait Pattern: Step-to pattern;Decreased stride length;Trunk flexed    Exercises     PT Diagnosis:    PT Problem List:   PT Treatment Interventions:     PT Goals Acute Rehab PT Goals PT Goal Formulation: With patient Time For Goal Achievement: 01/26/13 Pt will go Supine/Side to Sit: with min assist PT Goal: Supine/Side to Sit - Progress: Progressing toward goal Pt will go Sit to Stand: with min assist PT Goal: Sit to Stand - Progress: Progressing toward goal Pt will Transfer Bed to Chair/Chair to Bed: with min assist PT Transfer Goal: Bed to Chair/Chair to Bed - Progress: Progressing toward goal Pt will Ambulate: 16 - 50 feet;with min assist;with least restrictive assistive device PT Goal: Ambulate - Progress: Progressing toward goal  Visit Information  Last PT Received On: 01/13/13 Assistance Needed: +2    Subjective Data  Subjective: My bottom hurts. Patient Stated Goal: return to independence   Cognition  Cognition Overall Cognitive Status: Appears within functional limits for tasks assessed/performed Arousal/Alertness: Awake/alert Orientation Level: Appears intact for tasks assessed Behavior During Session: [facility] for tasks performed    Balance  End of Session PT - End of Session Equipment Utilized During Treatment: Gait belt;Oxygen Activity Tolerance: Patient limited by fatigue Patient left: in chair;with call bell/phone within reach Nurse Communication:  Mobility status (O2 sats and blood per rectum. )   GP     Vista Deck 01/13/2013, 10:43 AM

## 2013-01-13 NOTE — Progress Notes (Signed)
PROGRESS NOTE  Tammy Myers GNF:[id number] DOB: [birth year] DOA: 01/09/2013 PCP: [name], MD  Brief narrative: 69b yr old female with h/o Severe Ulcerative colitis in 12/08/11, h/o chronic abdominal pain with ANA + 1:80, MRI with Nodular liver/cirrhosis and dilated CBD admitted 01/10/13 with GIB in a setting of anticoagulation for A fib.  She underwent endoscopy 01/10/13 showing bleeding esophageal varix which was band ligated and was palced on Octretide Gtt and PPI IV and kept on full liquids.  PPI was transitioned to PO 4.7.14 and patient was noted to has had some BRB per rectum by GI thought more to be 2/2 to hemorrhoidal bleeding.  Past medical history-As per Problem list Chart reviewed as below- Admission 3.1.13 with symptomatic cholecystitis with eventual lap chole-severe ulcerative esophagitis noted that admit as well Admission 1.23.13 c Upper GI bleed Admission 11/16/10 with rectal prolapse Admission 09/18/09 with Abd pain and warfarin coagulopathy  Consultants:  Gastroenterology Dr. Russella Dar was consulted  Procedures:  Abdominal x-ray two-view showed nonobstructive bowel gas pattern  1 view chest x-ray 01/09/2013 showed chronic bronchitic changes in the lungs with interstitial fibrosis  ENDOSCOPIC IMPRESSION 01/10/13: 1. Small, actively bleeding esophageal varix; band ligation x 2 was performed 2. Moderate sized hiatal hernia  Antibiotics:  Levaquin 4.3.14   Subjective  Doing well.  Spilled water on herself.  No SOB out of ordinary.  Has had some BRB per rectum this am and  Exam by Dr. Christella Hartigan revealed bleeding  Hemorrhoids.  NO CP or SOB   Objective    Interim History: See above  Telemetry: NSR no reds  Objective: Filed Vitals:   01/12/13 1724 01/12/13 1754 01/12/13 2220 01/13/13 0459  BP: 176/72 150/68 135/69 142/74  Pulse: 70  73 78  Temp:   98.4 F (36.9 C) 98.4 F (36.9 C)  TempSrc:   Oral Oral  Resp:   16 20  Height:      Weight:      SpO2:   93% 94%     Intake/Output Summary (Last 24 hours) at 01/13/13 1332 Last data filed at 01/13/13 0700  Gross per 24 hour  Intake   1385 ml  Output    275 ml  Net   1110 ml    Exam:  General: Alert frail Caucasian female no apparent distress Cardiovascular: S1-S2 murmur left lower sternum  Respiratory: Clinically clear Abdomen: Soft nontender nondistended-some firm masses flet in lower abdomen Rectal exam deferred Skin ecchymosis over left arm where she had transfusion Neuro grossly intact- but she has some mild confusion  Data Reviewed: Basic Metabolic Panel:  Recent Labs Lab 01/09/13 2023 01/12/13 0930  NA 140 139  K 5.1 3.5  CL 108 107  CO2 25 28  GLUCOSE 133* 104*  BUN 46* 27*  CREATININE 0.84 0.58  CALCIUM 8.8 7.6*   Liver Function Tests:  Recent Labs Lab 01/09/13 2023  AST 36  ALT 28  ALKPHOS 241*  BILITOT 0.9  PROT 5.0*  ALBUMIN 2.5*    Recent Labs Lab 01/09/13 2023  LIPASE 43   No results found for this basename: AMMONIA,  in the last 168 hours CBC:  Recent Labs Lab 01/09/13 2023  01/11/13 0920 01/11/13 2057 01/12/13 0857 01/12/13 2040 01/13/13 0830  WBC 7.3  --   --   --   --   --   --   NEUTROABS 5.7  --   --   --   --   --   --  HGB 6.8*  < > 11.3* 10.9* 11.2* 11.2* 10.9*  HCT 20.0*  < > 33.5* 32.8* 33.8* 34.3* 32.9*  MCV 94.3  --   --   --   --   --   --   PLT 143*  --   --   --   --   --   --   < > = values in this interval not displayed. Cardiac Enzymes: No results found for this basename: CKTOTAL, CKMB, CKMBINDEX, TROPONINI,  in the last 168 hours BNP: No components found with this basename: POCBNP,  CBG: No results found for this basename: GLUCAP,  in the last 168 hours  Recent Results (from the past 240 hour(s))  URINE CULTURE     Status: None   Collection Time    01/09/13 10:35 PM      Result Value Range Status   Specimen Description URINE, CLEAN CATCH   Final   Special Requests NONE   Final   Culture  Setup Time 01/10/2013  03:25   Final   Colony Count 75,000 COLONIES/ML   Final   Culture     Final   Value: Multiple bacterial morphotypes present, none predominant. Suggest appropriate recollection if clinically indicated.   Report Status 01/11/2013 FINAL   Final  MRSA PCR SCREENING     Status: None   Collection Time    01/10/13  1:30 AM      Result Value Range Status   MRSA by PCR NEGATIVE  NEGATIVE Final   Comment:            The GeneXpert MRSA Assay (FDA     approved for NASAL specimens     only), is one component of a     comprehensive MRSA colonization     surveillance program. It is not     intended to diagnose MRSA     infection nor to guide or     monitor treatment for     MRSA infections.     Studies:              All Imaging reviewed and is as per above notation   Scheduled Meds: . carvedilol  3.125 mg Oral BID WC  . collagenase   Topical Daily  . feeding supplement  237 mL Oral Q24H  . fentaNYL  25 mcg Transdermal Q72H  . hydrocortisone   Rectal TID  . multivitamin with minerals  1 tablet Oral Daily  . pantoprazole  40 mg Oral Daily  . predniSONE  5 mg Oral Daily  . sodium chloride  10-40 mL Intracatheter Q12H  . sodium chloride  3 mL Intravenous Q12H   Continuous Infusions: . sodium chloride 20 mL/hr at 01/12/13 0930     Assessment/Plan: 1. GI bleed-potentially upper GI bleed secondary to gastric ulcer disease-EGD 01/11/12=small bleeding varix-band ligated x 2 continue Protonix 40 q12 po, Octreotide 2 mcg/ml d/c'd 4.7.14 2. Esophageal varix-probably a result of portal HTN-coreg 3.125 bid-Changed per GI to Nadolol 40 mg daily 3. History of chronic bile duct dilatation, chronically elevated alkaline phosphatase-abdomen pain is chronic. She has been worked up in the past and was not candidate for ERCP given her advanced age-she does have a 9 x 7 mm cyst in the pancreatic body on MRI of the abdomen-AFP negative- Ultrasound ordered by GI 4.5.14-Report still pending 4. 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 ? Ulcerative colitis/Crohn's 6. History of internal hemorrhoids-recently evaluated and sclerosed-started on anusol per Dr. Christella Hartigan 4.7.14-?decision re: flex sig dependant on Hemoglobin in am and clinical status 7. H/o Paroxsmal Afib-elevated chads score-poor candidate for anticoagulation at present.  Seems to be in NSR on tele now-appears anti-coagulation d/c in 10/2011 8. Hypertension h/o-patient now hypertensive once again-Continue Nadolol 40 daily. D/c prn Labetalol given better HTN control 9. History of polymyalgia rheumatica and temporal arteritis-Transition to home doses of steroids prednisone 5 mg 10. Mild confusion-patient is on Aricept-Uses this in the interim will need to be monitored closely  Code Status: Full Family Communication: discussed course of care with brother -in-law at bedside Disposition Plan: SNF on d/c-Not medically ready yet.  Might be 1-2 days once on full diet   Pleas Koch, MD  [facility] Pager [phone] 01/13/2013, 1:32 PM    LOS: 4 days

## 2013-01-13 NOTE — Evaluation (Signed)
Occupational Therapy Evaluation Patient Details Name: Tammy Myers MRN: [id number] DOB: 06/10/1921 Today's Date: 01/13/2013 Time: 4098-1191 OT Time Calculation (min): 22 min  OT Assessment / Plan / Recommendation Clinical Impression  Pt presents to OT with decreased I with ADL activity and will benefit from skilled OT at [facility] to increase I. Pt will need [facility] than [facility] apartment    OT Assessment  All further OT needs can be met in the next venue of care    Follow Up Recommendations  SNF - will defer further OT to SNF               Precautions / Restrictions Precautions Precautions: Fall       ADL  Lower Body Bathing: Performed;+1 Total assistance Where Assessed - Lower Body Bathing: Supported sit to stand Lower Body Dressing: Performed;+1 Total assistance Where Assessed - Lower Body Dressing: Supported sit to stand Toilet Transfer: Performed;Moderate assistance Toilet Transfer Method: Sit to stand;Stand pivot Toilet Transfer Equipment: Bedside commode Toileting - Clothing Manipulation and Hygiene: Performed;+1 Total assistance Where Assessed - Toileting Clothing Manipulation and Hygiene: Standing    OT Diagnosis: Generalized weakness  OT Problem List: Decreased strength;Decreased safety awareness;Impaired balance (sitting and/or standing)       Visit Information  Last OT Received On: 01/13/13       Prior Functioning     Home Living Available Help at Discharge: Other (Comment) (pt will need ST SNF upon DC)         Vision/Perception Vision - History Patient Visual Report: No change from baseline Vision - Assessment Eye Alignment: Within Functional Limits      Extremity/Trunk Assessment Right Upper Extremity Assessment RUE ROM/Strength/Tone: WFL for tasks assessed Left Upper Extremity Assessment LUE ROM/Strength/Tone: WFL for tasks assessed     Mobility Bed Mobility Bed Mobility: Supine to Sit Left Sidelying  to Sit: 2: Max assist Supine to Sit: 2: Max assist Transfers Transfers: Sit to Stand;Stand to Sit Sit to Stand: 3: Mod assist;With upper extremity assist;From bed;From chair/3-in-1;From toilet Stand to Sit: 3: Mod assist;With upper extremity assist;To toilet;To chair/3-in-1           End of Session OT - End of Session Activity Tolerance: Patient tolerated treatment well Patient left: in chair;with call bell/phone within reach  GO     [facility], [place] 01/13/2013, 9:33 AM

## 2013-01-13 NOTE — Progress Notes (Signed)
CSW spoke with patient's sister, [name] (ph#: [id number]) who lives at [facility] as well and was taking care of her sister. CSW explained to [name] that PT has recommended SNF at discharge - confirmed with [name] @ [facility] that they will have a bed available at discharge for her. Anticipating discharge Tuesday/Wednesday.   Clinical Social Work Department CLINICAL SOCIAL WORK PLACEMENT NOTE 01/13/2013  Patient:  Tammy Myers, Tammy Myers  Account Number:  [id number] Admit date:  01/09/2013  Clinical Social Worker:  Orpah Greek  Date/time:  01/13/2013 03:43 PM  Clinical Social Work is seeking post-discharge placement for this patient at the following level of care:   SKILLED NURSING   (*CSW will update this form in Epic as items are completed)   01/13/2013  Patient/family provided with [facility] Department of Clinical Social Work's list of facilities offering this level of care within the geographic area requested by the patient (or if unable, by the patient's family).  01/13/2013  Patient/family informed of their freedom to choose among providers that offer the needed level of care, that participate in Medicare, Medicaid or managed care program needed by the patient, have an available bed and are willing to accept the patient.  01/13/2013  Patient/family informed of [facility]' ownership interest in [facility], as well as of the fact that they are under no obligation to receive care at this facility.  PASARR submitted to EDS on 01/13/2013 PASARR number received from EDS on 01/13/2013  FL2 transmitted to all facilities in geographic area requested by pt/family on  01/13/2013 FL2 transmitted to all facilities within larger geographic area on   Patient informed that his/her managed care company has contracts with or will negotiate with  certain facilities, including the following:     Patient/family informed of bed offers received:   01/13/2013 Patient chooses bed at [name] AT [facility] Physician recommends and patient chooses bed at    Patient to be transferred to [name] AT [name] on   Patient to be transferred to facility by   The following physician request were entered in Epic:   Additional Comments:  Unice Bailey, LCSW [facility] Clinical Social Worker cell #: [phone]

## 2013-01-14 LAB — CBC
Hemoglobin: 11.4 g/dL — ABNORMAL LOW (ref 12.0–15.0)
MCV: 93.7 fL (ref 78.0–100.0)
Platelets: 84 10*3/uL — ABNORMAL LOW (ref 150–400)
RBC: 3.66 MIL/uL — ABNORMAL LOW (ref 3.87–5.11)
WBC: 7.2 10*3/uL (ref 4.0–10.5)

## 2013-01-14 LAB — COMPREHENSIVE METABOLIC PANEL
ALT: 53 U/L — ABNORMAL HIGH (ref 0–35)
AST: 65 U/L — ABNORMAL HIGH (ref 0–37)
CO2: 27 mEq/L (ref 19–32)
Chloride: 106 mEq/L (ref 96–112)
GFR calc Af Amer: >90 mL/min (ref >90)
GFR calc non Af Amer: 82 mL/min — ABNORMAL LOW (ref >90)
Glucose, Bld: 110 mg/dL — ABNORMAL HIGH (ref 70–99)
Sodium: 137 mEq/L (ref 135–145)
Total Bilirubin: 1.7 mg/dL — ABNORMAL HIGH (ref 0.3–1.2)

## 2013-01-14 MED ORDER — HYDROCODONE-ACETAMINOPHEN 5-500 MG PO TABS: 1.0000 | ORAL_TABLET | Freq: Four times a day (QID) | ORAL | Status: DC | PRN

## 2013-01-14 MED ORDER — HYDROCORTISONE 2.5 % RE CREA: TOPICAL_CREAM | Freq: Three times a day (TID) | RECTAL | Status: DC

## 2013-01-14 MED ORDER — FENTANYL 25 MCG/HR TD PT72: 1.0000 | MEDICATED_PATCH | TRANSDERMAL | Status: DC

## 2013-01-14 MED ORDER — NADOLOL 40 MG PO TABS: 40.0000 mg | ORAL_TABLET | Freq: Every day | ORAL | Status: DC

## 2013-01-14 NOTE — Discharge Summary (Addendum)
Physician Discharge Summary  [facility] BJY:[id number] DOB: [birth year] DOA: 01/09/2013  PCP: Tammy Relic, MD  Admit date: 01/09/2013 Discharge date: 01/14/2013  Time spent: 35 minutes  Recommendations for Outpatient Follow-up:  1. Recommend repeat LFTs in about one to 2 week with CBC-consider discontinuation of vaginal estrogen as this can predispose to stones 2. Please followup anti-microsomal antibody 3. Needs outpatient followup for repeat endoscopy as per appointment 4. Corgard start this admission for dysphagia varix 5. Has poor memory and probable dementia-needs outpatient MMSE 6. Monitor for bright red blood in stool-she does have hemorrhoids  7. PT/Ot rec SNF given debility and will be d/c back to Skilled [facility] of [facility]  Discharge Diagnoses:  Principal Problem:   GI bleed Active Problems:   Hematemesis   Epistaxis   Esophageal varices with hemorrhage   Discharge Condition: Fair  Diet recommendation: Low-salt heart healthy  Filed Weights   01/10/13 0100 01/11/13 0400  Weight: 51.8 kg (114 lb 3.2 oz) 52.4 kg (115 lb 8.3 oz)    History of present illness:  56b yr old female with h/o Severe Ulcerative colitis in 12/08/11, h/o chronic abdominal pain with ANA + 1:80, MRI with Nodular liver/cirrhosis and dilated CBD admitted 01/10/13 with GIB in a setting of anticoagulation for A fib. She underwent endoscopy 01/10/13 showing bleeding esophageal varix which was band ligated and was palced on Octretide Gtt and PPI IV and kept on full liquids. PPI was transitioned to PO 4.7.14 and patient was noted to has had some BRB per rectum by GI thought more to be 2/2 to hemorrhoidal bleeding. He below for full hospital summary   Hospital Course:  1. GI bleed-potentially upper GI bleed secondary to gastric ulcer disease-EGD 01/11/12=small bleeding varix-band ligated x 2 continue Protonix 40 q12 po, [Octreotide 2 mcg/ml d/c'd 4.7.14]-  tolerated full diet without further  bleeding Appointment setup on April 29 with Dr. Marina Myers for further endoscopy consideration/Sigmoidoscopy 2. Esophageal varix-probably a result of portal HTN-coreg 3.125 bid started by me-Changed per GI to Nadolol 40 mg daily 3. History of chronic bile duct dilatation, chronically elevated alkaline phosphatase-abdomen pain is chronic. 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 ? Ulcerative colitis/Crohn's-on canasa 6. History of internal hemorrhoids-recently evaluated and sclerosed-started on anusol per Dr. Christella Hartigan 4.7.14-?decision re: flex sig dependant on Hemoglobin in am and clinical status to outpatient visit with Dr. Marina Myers 7. H/o Paroxsmal Afib-elevated chads score-poor candidate for anticoagulation at present. Seems to be in NSR on tele now-appears anti-coagulation d/c in 10/2011 8. Hypertension h/o-patient now hypertensive once again-Continue Nadolol 40 daily. D/c prn Labetalol given better HTN control 9. History of polymyalgia rheumatica and temporal arteritis-Transition to home doses of steroids prednisone 5 mg 10. Mild confusion-patient is on Aricept-Uses this in the interim will need to be monitored closely as this could be a precipitant for bleeding  Chart reviewed as below-  Admission  3.1.13 with symptomatic cholecystitis with eventual lap chole-severe ulcerative esophagitis noted that admit as well  Admission 1.23.13 c Upper GI bleed  Admission 11/16/10 with rectal prolapse  Admission 09/18/09 with Abd pain and warfarin coagulopathy  Consultants:  Gastroenterology Dr. Russella Myers was consulted Procedures:  Abdominal x-ray two-view showed nonobstructive bowel gas pattern  1 view chest x-ray 01/09/2013 showed chronic bronchitic changes in the lungs with interstitial fibrosis ENDOSCOPIC IMPRESSION 01/10/13: 1. Small, actively bleeding esophageal varix; band ligation x 2 was performed 2. Moderate sized hiatal hernia Antibiotics:  Levaquin 4.3.14  Discharge Exam: Filed Vitals:   01/13/13 0459 01/13/13 1416 01/13/13 2128 01/14/13 0724  BP: 142/74 147/70 155/72 151/85  Pulse: 78 81 85 79  Temp: 98.4 F (36.9 C) 98.1 F (36.7 C) 98.7 F (37.1 C) 97.7 F (36.5 C)  TempSrc: Oral Oral Oral Oral  Resp: 20 19 17 20   Height:      Weight:      SpO2: 94% 94% 95% 97%   Confused. No apparent distress currently Nursing reports past uneventful night  General:  Stable  Cardiovascular: S1-S2 no murmur rub or gallop Respiratory: clear   Discharge Instructions  Discharge Orders   Future Appointments Provider Department Dept Phone   01/22/2013 11:05 AM Romie Levee, MD [facility], [place] [phone]   01/23/2013 3:15 PM Tammy Relic, MD [facility] [phone]   02/04/2013 1:30 PM Hilarie Fredrickson, MD [facility] [phone]   02/27/2013 1:30 PM Man X Mast, NP [facility] [phone]   Future Orders Complete By Expires     Diet - low sodium heart healthy  As directed     Increase activity slowly  As directed         Medication List    STOP taking these medications       aspirin EC 81 MG tablet     docusate sodium 100 MG capsule  Commonly known as:  COLACE     polyethylene glycol powder powder  Commonly known as:   GLYCOLAX/MIRALAX      TAKE these medications       calcium-vitamin D 500-200 MG-UNIT per tablet  Commonly known as:  OSCAL WITH D  Take 1 tablet by mouth 2 (two) times daily.     donepezil 10 MG tablet  Commonly known as:  ARICEPT  Take 10 mg by mouth at bedtime.     estradiol 0.1 MG/GM vaginal cream  Commonly known as:  ESTRACE  Place 2 g vaginally once a week. Applied on Thursday nights.     fentaNYL 25 MCG/HR  Commonly known as:  DURAGESIC - dosed mcg/hr  Place 1 patch (25 mcg total) onto the skin every 3 (three) days.     HYDROcodone-acetaminophen 5-500 MG per tablet  Commonly known as:  VICODIN  Take 1 tablet by mouth every 6 (six) hours as needed for pain.     hydrocortisone 2.5 % rectal cream  Commonly known as:  ANUSOL-HC  Place rectally 3 (three) times daily.     mesalamine 1000 MG suppository  Commonly known as:  CANASA  Place 1 suppository (1,000 mg total) rectally daily.     nadolol 40 MG tablet  Commonly known as:  CORGARD  Take 1 tablet (40 mg total) by mouth daily.     pantoprazole 40 MG tablet  Commonly known as:  PROTONIX  Take 40 mg by mouth 2 (two) times daily.  predniSONE 5 MG tablet  Commonly known as:  DELTASONE  Take 5 mg by mouth daily.     witch hazel-glycerin pad  Commonly known as:  TUCKS  Place 1 application rectally as needed for pain.           Follow-up Information   Follow up with Yancey Flemings, MD On 02/04/2013. (1:30 pm)    Contact information:   [address] [place] [address] [phone]       Follow up with GREEN, Lenon Curt, MD.   Contact information:   [address] [place] [address] [phone]        The results of significant diagnostics from this hospitalization (including imaging, microbiology, ancillary and laboratory) are listed below for reference.    Significant Diagnostic Studies: Dg Chest 1 View  01/09/2013  *RADIOLOGY REPORT*  Clinical Data: Abdominal pain and rectal bleeding.   CHEST - 1 VIEW  Comparison: 11/14/2010  Findings: Cardiac enlargement with normal pulmonary vascularity. Emphysematous changes in the lungs.  Fibrosis in the lung bases. Peribronchial thickening and central interstitial changes suggesting chronic bronchitis.  No focal airspace disease or consolidation.  No blunting of costophrenic angles.  No pneumothorax.  Mediastinal contours appear intact.  Calcified and tortuous aorta.  Degenerative changes in the shoulders with suggestion of old fracture deformity of the distal right clavicle. No significant changes since the previous study.  IMPRESSION: Numbness and chronic bronchitic changes in the lungs with interstitial fibrosis in the bases.  No evidence of active consolidation.   Original Report Authenticated By: Burman Nieves, M.D.    US Abdomen Complete  01/13/2013  *RADIOLOGY REPORT*  Clinical Data:  Abdominal pain.  COMPLETE ABDOMINAL ULTRASOUND  Comparison:  Two-view abdomen 01/09/2013.  Abdominal ultrasound 12/28/2011.  Findings:  Gallbladder:  Status post cholecystectomy.  Common bile duct:  The common bile duct is markedly enlarged. Previous MRI suggested tapering and possible distal obstruction. The diameter has increased, now measuring 15.5 mm.  Liver:  Liver is diffusely heterogeneous.  The liver edges irregular, compatible with cirrhosis.  IVC:  Appears normal.  Pancreas:  The pancreas is heterogeneous and somewhat thickened. No discrete lesion is seen.  Spleen:  Normal size and echotexture without focal parenchymal abnormality.The maximal length is 5.5 cm, within normal limits.  Right Kidney:  Mild cortical thinning is present.  No discrete lesions are present.  There is no significant change.  Maximal length is 10.4 cm, within normal limits.  Left Kidney:  Mild cortical thinning is present.  No discrete lesions are present.  There is no significant change.  Maximal length is 11.0 cm, within normal limits.  Abdominal aorta:  Maximal diameters maximal  diameter is 2.1 cm, within normal limits.  Atherosclerotic irregularities are noted.  Moderate abdominal ascites is increased.  Bilateral pleural effusions are evident.  IMPRESSION:  1.  Cirrhotic changes of the liver. 2.  Increasing size of the common bile duct.  No obstructing lesion is evident. 3.  Atherosclerosis. 4.  Moderate abdominal ascites has increased. 5.  Bilateral pleural 5.  Bilateral pleural effusions. effusions.   Original Report Authenticated By: Marin Roberts, M.D.    Dg Chest Port 1 View  01/10/2013  *RADIOLOGY REPORT*  Clinical Data: Left PICC line placement.  PORTABLE CHEST - 1 VIEW  Comparison: 01/09/2013  Findings: Left upper extremity PICC line is in place.  The tip lies in the right atrium.  Recommend retraction of the catheter by approximately 3 cm.  Lung volumes are  low bilaterally.  IMPRESSION: PICC line tip lies in the right atrium.  Recommend retraction of the catheter by approximately 3 cm.   Original Report Authenticated By: Irish Lack, M.D.    Dg Abd 2 Views  01/09/2013  *RADIOLOGY REPORT*  Clinical Data: Abdominal pain and rectal bleeding.  ABDOMEN - 2 VIEW  Comparison: 12/14/2011  Findings: Marked thoracolumbar scoliosis convex towards the left with diffuse degenerative changes throughout the thoracic spine. Degenerative changes in the hips.  Scattered gas and stool in the colon.  Scattered gas filled nondistended small bowel.  No small or large bowel distension.  No free intra-abdominal air.  No abnormal air fluid levels.  Surgical clips in the right upper quadrant and pelvis.  No radiopaque stones.  Aortoiliac vascular calcifications.  IMPRESSION: Nonobstructive bowel gas pattern.   Original Report Authenticated By: Burman Nieves, M.D.     Microbiology: Recent Results (from the past 240 hour(s))  URINE CULTURE     Status: None   Collection Time    01/09/13 10:35 PM      Result Value Range Status   Specimen Description URINE, CLEAN CATCH   Final   Special  Requests NONE   Final   Culture  Setup Time 01/10/2013 03:25   Final   Colony Count 75,000 COLONIES/ML   Final   Culture     Final   Value: Multiple bacterial morphotypes present, none predominant. Suggest appropriate recollection if clinically indicated.   Report Status 01/11/2013 FINAL   Final  MRSA PCR SCREENING     Status: None   Collection Time    01/10/13  1:30 AM      Result Value Range Status   MRSA by PCR NEGATIVE  NEGATIVE Final   Comment:            The GeneXpert MRSA Assay (FDA     approved for NASAL specimens     only), is one component of a     comprehensive MRSA colonization     surveillance program. It is not     intended to diagnose MRSA     infection nor to guide or     monitor treatment for     MRSA infections.     Labs: Basic Metabolic Panel:  Recent Labs Lab 01/09/13 2023 01/12/13 0930 01/14/13 0500  NA 140 139 137  K 5.1 3.5 3.5  CL 108 107 106  CO2 25 28 27   GLUCOSE 133* 104* 110*  BUN 46* 27* 23  CREATININE 0.84 0.58 0.53  CALCIUM 8.8 7.6* 7.7*   Liver Function Tests:  Recent Labs Lab 01/09/13 2023 01/14/13 0500  AST 36 65*  ALT 28 53*  ALKPHOS 241* 348*  BILITOT 0.9 1.7*  PROT 5.0* 4.5*  ALBUMIN 2.5* 2.3*    Recent Labs Lab 01/09/13 2023  LIPASE 43   No results found for this basename: AMMONIA,  in the last 168 hours CBC:  Recent Labs Lab 01/09/13 2023  01/11/13 2057 01/12/13 0857 01/12/13 2040 01/13/13 0830 01/14/13 0500  WBC 7.3  --   --   --   --   --  7.2  NEUTROABS 5.7  --   --   --   --   --   --   HGB 6.8*  < > 10.9* 11.2* 11.2* 10.9* 11.4*  HCT 20.0*  < > 32.8* 33.8* 34.3* 32.9* 34.3*  MCV 94.3  --   --   --   --   --  93.7  PLT 143*  --   --   --   --   --  84*  < > = values in this interval not displayed. Cardiac Enzymes: No results found for this basename: CKTOTAL, CKMB, CKMBINDEX, TROPONINI,  in the last 168 hours BNP: BNP (last 3 results) No results found for this basename: PROBNP,  in the last 8760  hours CBG: No results found for this basename: GLUCAP,  in the last 168 hours     Signed:  Rhetta Mura  [facility] 01/14/2013, 12:23 PM

## 2013-01-14 NOTE — Progress Notes (Signed)
[facility] Progress Note    Since last GI note: Poor memory, doesn't recall what she ate last night.  Minor red rectal bleeding yesterday AM.  Has been having difficulty with hemorrhoids even recent surgery visit.  Anusol ordered yesterday and RNs have seen no furhter bleeding. Carvedilol stopped, nadolol started for BP control and to reduce risk fo variceal rebleeding.   Objective: Vital signs in last 24 hours: Temp:  [97.7 F (36.5 C)-98.7 F (37.1 C)] 97.7 F (36.5 C) (04/08 0724) Pulse Rate:  [79-85] 79 (04/08 0724) Resp:  [17-20] 20 (04/08 0724) BP: (147-155)/(70-85) 151/85 mmHg (04/08 0724) SpO2:  [94 %-97 %] 97 % (04/08 0724) Last BM Date: 01/12/13 General: alert and oriented times 1 or 2 Heart: regular rate and rythm Abdomen: soft, non-tender, non-distended, normal bowel sounds   Lab Results:  Recent Labs  01/12/13 2040 01/13/13 0830 01/14/13 0500  WBC  --   --  7.2  HGB 11.2* 10.9* 11.4*  PLT  --   --  84*  MCV  --   --  93.7    Recent Labs  01/12/13 0930 01/14/13 0500  NA 139 137  K 3.5 3.5  CL 107 106  CO2 28 27  GLUCOSE 104* 110*  BUN 27* 23  CREATININE 0.58 0.53  CALCIUM 7.6* 7.7*    Recent Labs  01/14/13 0500  PROT 4.5*  ALBUMIN 2.3*  AST 65*  ALT 53*  ALKPHOS 348*  BILITOT 1.7*   No results found for this basename: INR,  in the last 72 hours   Studies/Results: US Abdomen Complete  01/13/2013  *RADIOLOGY REPORT*  Clinical Data:  Abdominal pain.  COMPLETE ABDOMINAL ULTRASOUND  Comparison:  Two-view abdomen 01/09/2013.  Abdominal ultrasound 12/28/2011.  Findings:  Gallbladder:  Status post cholecystectomy.  Common bile duct:  The common bile duct is markedly enlarged. Previous MRI suggested tapering and possible distal obstruction. The diameter has increased, now measuring 15.5 mm.  Liver:  Liver is diffusely heterogeneous.  The liver edges irregular, compatible with cirrhosis.  IVC:  Appears normal.  Pancreas:  The pancreas is  heterogeneous and somewhat thickened. No discrete lesion is seen.  Spleen:  Normal size and echotexture without focal parenchymal abnormality.The maximal length is 5.5 cm, within normal limits.  Right Kidney:  Mild cortical thinning is present.  No discrete lesions are present.  There is no significant change.  Maximal length is 10.4 cm, within normal limits.  Left Kidney:  Mild cortical thinning is present.  No discrete lesions are present.  There is no significant change.  Maximal length is 11.0 cm, within normal limits.  Abdominal aorta:  Maximal diameters maximal diameter is 2.1 cm, within normal limits.  Atherosclerotic irregularities are noted.  Moderate abdominal ascites is increased.  Bilateral pleural effusions are evident.  IMPRESSION:  1.  Cirrhotic changes of the liver. 2.  Increasing size of the common bile duct.  No obstructing lesion is evident. 3.  Atherosclerosis. 4.  Moderate abdominal ascites has increased. 5.  Bilateral pleural 5.  Bilateral pleural effusions. effusions.   Original Report Authenticated By: Marin Roberts, M.D.      Medications: Scheduled Meds: . collagenase   Topical Daily  . feeding supplement  237 mL Oral Q24H  . fentaNYL  25 mcg Transdermal Q72H  . hydrocortisone   Rectal TID  . multivitamin with minerals  1 tablet Oral Daily  . nadolol  40 mg Oral Daily  . pantoprazole  40 mg Oral Daily  .  predniSONE  5 mg Oral Daily  . sodium chloride  10-40 mL Intracatheter Q12H  . sodium chloride  3 mL Intravenous Q12H   Continuous Infusions: . sodium chloride 20 mL/hr at 01/12/13 0930   PRN Meds:.morphine injection, ondansetron (ZOFRAN) IV, ondansetron, sodium chloride, sodium chloride, traMADol    Assessment/Plan: 79 y.o. female with cirrhosis, variceal bleeding, hemorrhoidal bleeding  Bleeding had stopped.  Tolerating heart healthy diet.  She will need return office visit with Dr. Marina Goodell (we will arrange) in 3-4 weeks.  Will have to consider repeat EGD,  rebanding at that point, recheck liver function tests.  OK to d/c to SNF if hospitalist agrees.    Rob Bunting, MD  01/14/2013, 7:47 AM [facility] Pager [phone]

## 2013-01-14 NOTE — Clinical Documentation Improvement (Signed)
Anemia Blood Loss Clarification  THIS DOCUMENT IS NOT A PERMANENT PART OF THE MEDICAL RECORD  RESPOND TO THE THIS QUERY, FOLLOW THE INSTRUCTIONS BELOW:  1. If needed, update documentation for the patient's encounter via the notes activity.  2. Access this query again and click edit on the In [employer].  3. After updating, or not, click F2 to complete all highlighted (required) fields concerning your review. Select "additional documentation in the medical record" OR "no additional documentation provided".  4. Click Sign note button.  5. The deficiency will fall out of your In Basket *Please let us know if you are not able to complete this workflow by phone or e-mail (listed below).        01/14/13  Dear Dr. [facility]  In an effort to better capture your patient's severity of illness, reflect appropriate length of stay and utilization of resources, a review of the patient medical record has revealed the following indicators.    Based on your clinical judgment, please clarify and document in a progress note and/or discharge summary the clinical condition associated with the following supporting information:  In responding to this query please exercise your independent judgment.  The fact that a query is asked, does not imply that any particular answer is desired or expected.  01/09/13 per H&P pt admitted with "profound Anemia", GIB, HGB 6.8* /HCT  20.0*. For accurate Dx specificity & severity can noted "anemia" be further specified for clinical cond being eval'd mon'd & tx'd. Thank you  Possible Clinical Conditions?   " Expected Acute Blood Loss Anemia " Acute Blood Loss Anemia is her diagnosis " Acute on chronic blood loss anemia  " Chronic blood loss anemia " Precipitous drop in Hematocrit  " Other Condition________________ " Cannot Clinically Determine    Supporting Information: Risk Factors: See Note Above   Signs and Symptoms: See above  note  Diagnostics: See above note for adm hgb & hct  Transfusion: 01/14/13: PRBC's transfused/ iv flds, serial CBC's noted, GI consult w/ EGD> Banding ligation of Varices, ppi's, telemetry noted,    Reviewed:  no additional documentation provided  Thank You, Toribio Harbour, RN, BSN, CCDS Certified Clinical Documentation Specialist Pager: [phone] Health Information Management 

## 2013-01-14 NOTE — Progress Notes (Addendum)
Patient is set to discharge back to [facility] SN[facility] today. Patient, sister - [name] & brother-in-law aware, at bedside and ready to transport her back. Discharge packet given to RN, Delice Bison. Katie @ [facility] is aware.   Clinical Social Work Department CLINICAL SOCIAL WORK PLACEMENT NOTE 01/14/2013  Patient:  EBONEY, CLAYBROOK  Account Number:  [id number] Admit date:  01/09/2013  Clinical Social Worker:  Orpah Greek  Date/time:  01/13/2013 03:43 PM  Clinical Social Work is seeking post-discharge placement for this patient at the following level of care:   SKILLED NURSING   (*CSW will update this form in Epic as items are completed)   01/13/2013  Patient/family provided with [facility] Department of Clinical Social Work's list of facilities offering this level of care within the geographic area requested by the patient (or if unable, by the patient's family).  01/13/2013  Patient/family informed of their freedom to choose among providers that offer the needed level of care, that participate in Medicare, Medicaid or managed care program needed by the patient, have an available bed and are willing to accept the patient.  01/13/2013  Patient/family informed of [facility]' ownership interest in [facility], as well as of the fact that they are under no obligation to receive care at this facility.  PASARR submitted to EDS on 01/13/2013 PASARR number received from EDS on 01/13/2013  FL2 transmitted to all facilities in geographic area requested by pt/family on  01/13/2013 FL2 transmitted to all facilities within larger geographic area on   Patient informed that his/her managed care company has contracts with or will negotiate with  certain facilities, including the following:     Patient/family informed of bed offers received:  01/13/2013 Patient chooses bed at [facility] Physician recommends and patient chooses bed at    Patient to be  transferred to [facility] on  01/14/2013 Patient to be transferred to facility by sister & brother-in-law's car  The following physician request were entered in Epic:   Additional Comments:    Unice Bailey, LCSW [facility] Clinical Social Worker cell #: [phone]

## 2013-01-16 ENCOUNTER — Encounter: Payer: Self-pay | Admitting: Nurse Practitioner

## 2013-01-16 ENCOUNTER — Non-Acute Institutional Stay (SKILLED_NURSING_FACILITY): Payer: Medicare Other | Admitting: Nurse Practitioner

## 2013-01-16 DIAGNOSIS — I10 Essential (primary) hypertension: Secondary | ICD-10-CM

## 2013-01-16 DIAGNOSIS — K5900 Constipation, unspecified: Secondary | ICD-10-CM

## 2013-01-16 DIAGNOSIS — N952 Postmenopausal atrophic vaginitis: Secondary | ICD-10-CM | POA: Insufficient documentation

## 2013-01-16 DIAGNOSIS — K8010 Calculus of gallbladder with chronic cholecystitis without obstruction: Secondary | ICD-10-CM

## 2013-01-16 DIAGNOSIS — K922 Gastrointestinal hemorrhage, unspecified: Secondary | ICD-10-CM

## 2013-01-16 DIAGNOSIS — I8501 Esophageal varices with bleeding: Secondary | ICD-10-CM

## 2013-01-16 DIAGNOSIS — M1990 Unspecified osteoarthritis, unspecified site: Secondary | ICD-10-CM

## 2013-01-16 DIAGNOSIS — F0390 Unspecified dementia without behavioral disturbance: Secondary | ICD-10-CM | POA: Insufficient documentation

## 2013-01-16 DIAGNOSIS — M353 Polymyalgia rheumatica: Secondary | ICD-10-CM

## 2013-01-16 DIAGNOSIS — R7401 Elevation of levels of liver transaminase levels: Secondary | ICD-10-CM

## 2013-01-16 DIAGNOSIS — I4891 Unspecified atrial fibrillation: Secondary | ICD-10-CM

## 2013-01-16 DIAGNOSIS — K5909 Other constipation: Secondary | ICD-10-CM

## 2013-01-16 DIAGNOSIS — K649 Unspecified hemorrhoids: Secondary | ICD-10-CM

## 2013-01-16 LAB — ANTI-MICROSOMAL ANTIBODY LIVER / KIDNEY: Liver-Kidney Microsomal Ab: <=20 U (ref <=20.0)

## 2013-01-16 NOTE — Assessment & Plan Note (Signed)
Continue with Prednisone 5mg by month.                

## 2013-01-16 NOTE — Assessment & Plan Note (Signed)
Started Corgard during hospital stay

## 2013-01-16 NOTE — Assessment & Plan Note (Signed)
Rate controlled, off ASA 2nd to GI bleed.

## 2013-01-16 NOTE — Assessment & Plan Note (Signed)
Had bright red blood in stool. Treated with Hydrocortisone rectal cream, TUCKs pad, Mesalamine suppository rectal daily

## 2013-01-16 NOTE — Assessment & Plan Note (Addendum)
Takes Fentanyl 72mcg/hr and Vicodin q6hrs prn

## 2013-01-16 NOTE — Assessment & Plan Note (Signed)
Currently taking Aricept, may f/u MMSE   

## 2013-01-16 NOTE — Assessment & Plan Note (Signed)
No further GI bleed since admission to SNF 01/14/13, f/u CBC, f/u GI for repeat endoscopy. Cordard started during this hospital stay for dysphagia varices.

## 2013-01-16 NOTE — Assessment & Plan Note (Signed)
Blood pressure controlled on Nadolol 40mg 

## 2013-01-16 NOTE — Assessment & Plan Note (Signed)
Dysphagia varices and hemorrhoids-resolved.    

## 2013-01-16 NOTE — Assessment & Plan Note (Signed)
Repeat LFT

## 2013-01-16 NOTE — Progress Notes (Signed)
Date: 01/16/2013  MRN:  [id number] Name:  Tammy Myers Sex:  female Age:  79 y.o. DOB:Feb 24, 1921   [facility] #:                       Facility/Room; [facility] 24 Level Of Care: [address] Provider: [facility] Inger Wiest  Emergency Contacts: Contact Information   Name Relation Home Work Mobile   Routh,Mary Sister [phone]     Routh,Charles Other   [phone]      Code Status: Living Will  MOST Form: NA  Allergies: Allergies  Allergen Reactions  . Alendronate Sodium      Chief Complaint  Patient presents with  . Hospitalization Follow-up     HPI:   Hospitalized 01/09/13-01/14/13 for GI bleed(PUD, dysphagia varices hemorrhage, hemorrhoids, ulcerative colitis). The patient presented to hospital with hematemesis, epistaxis, bright red blood rectally.  Esophageal varices with hemorrhage-was band ligated x2--EGD 01/10/13 and started on Corgard after Octretide and PPI and full liquid, bright red blood from internal hemorrhoids(recently evaluated and sclerosed and started on anusol per Dr. Christella Hartigan and will f/u Dr. Marina Goodell as outpatient). Hx of ? ulcerative colitis in 12/08/11-on [place]. Chronic abd pain--was thought to be cholecystitis which resulted in cholecystectomy, but her abd pain persisted regardless the surgery. ANA 1:80, MRI with nodular liver/cirrhosis and dilated CBD-was not a candidate for ERCP give her age and pancreatic body cyst 9x19mm per MRI of abd.   Past Medical History  Diagnosis Date  . PMR (polymyalgia rheumatica)   . Temporal arteritis   . Osteoporosis   . Hearing loss   . Wears glasses   . Rectal bleeding   . Arthritis   . Rectal prolapse   . Hypertension   . Mitral valve prolapse   . Spinal stenosis   . Alzheimer's dementia   . Hyperlipidemia   . GERD (gastroesophageal reflux disease)   . Sciatica   . Atrial fibrillation   . Scoliosis     Past Surgical History  Procedure Laterality Date  . Abdominal hysterectomy    . Appendectomy    . Colonoscopy    . Breast surgery       left  . Hernia repair      [facility]  . Esophagogastroduodenoscopy  11/02/2011    Procedure: ESOPHAGOGASTRODUODENOSCOPY (EGD);  Surgeon: Yancey Flemings, MD;  Location: [facility] ENDOSCOPY;  Service: Endoscopy;  Laterality: N/A;  . Rectal prolapse repair, rectopexy  11/16/2010    with sigmoid colectomy.  Repair w biologic mesh in pelvis  . Cholecystectomy  12/12/11  . Esophagogastroduodenoscopy N/A 01/10/2013    Procedure: ESOPHAGOGASTRODUODENOSCOPY (EGD);  Surgeon: Meryl Dare, MD;  Location: [facility] ENDOSCOPY;  Service: Endoscopy;  Laterality: N/A;     Procedures: 01/09/13 CXR chronic bronchitic changes in the lungs with interstitial fibrosis 01/10/13 endoscopy: small, actively bleeding esophageal varix, band ligation x2 was performed. Moderate sized hiatal hernia.  01/12/13 abd US  IMPRESSION:   1.  Cirrhotic changes of the liver. 2.  Increasing size of the common bile duct.  No obstructing lesion is evident. 3.  Atherosclerosis. 4.  Moderate abdominal ascites has increased. 5.  Bilateral pleural 5.  Bilateral pleural effusions. effusions.  Consultants:  Dr. Christella Hartigan   Dr. Gwyneth Sprout  Current Outpatient Prescriptions  Medication Sig Dispense Refill  . calcium-vitamin D (OSCAL WITH D) 500-200 MG-UNIT per tablet Take 1 tablet by mouth 2 (two) times daily.      [name] donepezil (ARICEPT) 10 MG tablet Take 10 mg by mouth at bedtime.      [name]  estradiol (ESTRACE) 0.1 MG/GM vaginal cream Place 2 g vaginally once a week. Applied on Thursday nights.      . fentaNYL (DURAGESIC - DOSED MCG/HR) 25 MCG/HR Place 1 patch (25 mcg total) onto the skin every 3 (three) days.  5 patch  0  . HYDROcodone-acetaminophen (VICODIN) 5-500 MG per tablet Take 1 tablet by mouth every 6 (six) hours as needed for pain.  30 tablet  0  . hydrocortisone (ANUSOL-HC) 2.5 % rectal cream Place rectally 3 (three) times daily.  30 g    . mesalamine (CANASA) 1000 MG suppository Place 1 suppository (1,000 mg total) rectally daily.  30 suppository  12  .  nadolol (CORGARD) 40 MG tablet Take 1 tablet (40 mg total) by mouth daily.      . pantoprazole (PROTONIX) 40 MG tablet Take 40 mg by mouth 2 (two) times daily.      . predniSONE (DELTASONE) 5 MG tablet Take 5 mg by mouth daily.        [name] witch hazel-glycerin (TUCKS) pad Place 1 application rectally as needed for pain.       No current facility-administered medications for this visit.    There is no immunization history for the selected administration types on file for this patient.   Diet: regular  History  Substance Use Topics  . Smoking status: Never Smoker   . Smokeless tobacco: Never Used  . Alcohol Use: No    Family History  Problem Relation Age of Onset  . Cancer Brother     bladder     Review of Systems  Constitutional: Positive for malaise/fatigue. Negative for fever, chills, weight loss and diaphoresis.  HENT: Positive for hearing loss. Negative for ear pain, nosebleeds, congestion, sore throat and neck pain.   Eyes: Negative for pain, discharge and redness.  Respiratory: Positive for cough (hacking). Negative for sputum production, shortness of breath and wheezing.   Cardiovascular: Positive for PND. Negative for chest pain, palpitations, orthopnea, claudication and leg swelling.  Gastrointestinal: Positive for abdominal pain. Negative for heartburn, nausea, vomiting, diarrhea, constipation and blood in stool.  Genitourinary: Positive for frequency. Negative for dysuria, urgency, hematuria and flank pain.  Musculoskeletal: Positive for myalgias, back pain and joint pain.  Skin: Negative for itching and rash.  Neurological: Negative for dizziness, tingling, tremors, sensory change, speech change, focal weakness, seizures, loss of consciousness, weakness and headaches.  Endo/Heme/Allergies: Negative for environmental allergies and polydipsia. Bruises/bleeds easily.  Psychiatric/Behavioral: Positive for memory loss. Negative for depression and hallucinations. The patient is  not nervous/anxious and does not have insomnia.      Vital signs: BP 120/64  Pulse 62  Temp(Src) 97.2 F (36.2 C) (Tympanic)  Resp 16  Physical Exam  Constitutional: She is oriented to person, place, and time. She appears well-developed and well-nourished. No distress.  HENT:  Head: Normocephalic and atraumatic.  Right Ear: External ear normal.  Left Ear: External ear normal.  Nose: Nose normal.  Mouth/Throat: Oropharynx is clear and moist. No oropharyngeal exudate.  Eyes: Conjunctivae and EOM are normal. Pupils are equal, round, and reactive to light. Right eye exhibits no discharge. Left eye exhibits no discharge. No scleral icterus.  Neck: Normal range of motion. Neck supple. No JVD present. No thyromegaly present.  Cardiovascular: Normal rate, regular rhythm, normal heart sounds and intact distal pulses.   No murmur heard. Pulmonary/Chest: Effort normal. Not tachypneic. She has no decreased breath sounds. She has no wheezes. She has no rales. She exhibits no tenderness.  Abdominal: Soft. Bowel sounds are normal. She exhibits no distension and no mass. There is no tenderness. There is no rebound and no guarding.  Genitourinary: Vagina normal. No vaginal discharge found.  Musculoskeletal: Normal range of motion. She exhibits no edema and no tenderness.  kyphosis  Lymphadenopathy:    She has no cervical adenopathy.  Neurological: She is alert and oriented to person, place, and time. She has normal reflexes. She displays normal reflexes. No cranial nerve deficit. She exhibits normal muscle tone. Coordination normal.  Skin: Skin is warm and dry. No rash noted. She is not diaphoretic. No erythema.  Psychiatric: Judgment normal. Her mood appears not anxious. Her affect is not angry, not blunt, not labile and not inappropriate. Her speech is not rapid and/or pressured, not delayed, not tangential and not slurred. She is not agitated, not aggressive, not hyperactive, not slowed, not  withdrawn, not actively hallucinating and not combative. Thought content is not paranoid and not delusional. Cognition and memory are impaired. She does not express impulsivity or inappropriate judgment. She does not exhibit a depressed mood. She exhibits abnormal recent memory. She exhibits normal remote memory.     Screening Score  MMS    PHQ2    PHQ9     Fall Risk    BIMS    Annual summary: Hospitalizations: 09/18/09 abd pain coagulopathy 12/06/10 rectal prolapse surgery 11/01/11 Upper GI bleed 12/08/11 symptomatic cholecystitis with Lap chole- severe ulcerative esophagitis.   Problem List as of 01/16/2013     ICD-9-CM   HYPERTENSION   Last Assessment & Plan   01/16/2013 Nursing Home Written 01/16/2013 10:18 AM by Rhen Kawecki X Nguyet Mercer, NP     Blood pressure controlled on Nadolol 40mg     ATRIAL FIBRILLATION, PAROXYSMAL   Last Assessment & Plan   01/16/2013 Nursing Home Written 01/16/2013 10:19 AM by Inesha Sow X Torii Royse, NP     Rate controlled, off ASA 2nd to GI bleed.     OSTEOARTHRITIS   Last Assessment & Plan   01/16/2013 Nursing Home Edited 01/16/2013 10:23 AM by Davanee Klinkner X Maika Kaczmarek, NP     Takes Fentanyl 70mcg/hr and Vicodin q6hrs prn    MURMUR   Constipation, chronic   Last Assessment & Plan   01/16/2013 Nursing Home Written 01/16/2013 10:24 AM by Iker Nuttall X Alwaleed Obeso, NP     Able to be off Docusate and MiraLax.     Internal hemorrhoids with prolapse   Anemia   Esophagitis, erosive   Abdominal pain   elevated alkaline phosphatase   Nonspecific elevation of levels of transaminase or lactic acid dehydrogenase (LDH)   Last Assessment & Plan   01/16/2013 Nursing Home Written 01/16/2013 10:38 AM by Jaedyn Marrufo X Mikale Silversmith, NP     Repeat LFT    Chronically dilated bile duct   Ascites   Chronic cholecystitis with calculus   Last Assessment & Plan   01/16/2013 Nursing Home Written 01/16/2013 10:44 AM by Jaideep Pollack X Wilkes Potvin, NP     Risk reduction by considering dc Estradiol.     Abnormal LFTs   GI bleed   Last Assessment & Plan    01/16/2013 Nursing Home Written 01/16/2013 10:43 AM by Jagar Lua X Bryttani Blew, NP     Dysphagia varices and hemorrhoids-resolved.     Hematemesis   Epistaxis   Esophageal varices with hemorrhage   Last Assessment & Plan   01/16/2013 Nursing Home Written 01/16/2013 10:39 AM by Stephinie Battisti Juliann Pares Rafeef Lau, NP     Started Corgard during hospital stay  Hemorrhoids   Last Assessment & Plan   01/16/2013 Nursing Home Written 01/16/2013 10:42 AM by Taevyn Hausen X Chosen Geske, NP     Had bright red blood in stool. Treated with Hydrocortisone rectal cream, TUCKs pad, Mesalamine suppository rectal daily    Vaginitis, atrophic   Last Assessment & Plan   01/16/2013 Nursing Home Written 01/16/2013 10:48 AM by Lakesa Coste X Orbin Mayeux, NP     Continue Estrace pv 2x/wk for now, may consider dc since it may predispose to stones.     Dementia, in, senility   Last Assessment & Plan   01/16/2013 Nursing Home Written 01/16/2013 10:53 AM by Labria Wos X Genever Hentges, NP     Currently taking Aricept, may f/u MMSE    Polymyalgia rheumatica   Last Assessment & Plan   01/16/2013 Nursing Home Written 01/16/2013 10:55 AM by Lieutenant Abarca X [place] Espina, NP     Continue with Prednisone 5mg  by month.       Infection History: Levaquin completed 01/09/13 Functional assessment:  Areas of potential improvement: strengthening and ADL re-training, recover from GI bleed.  Rehabilitation Potential: fair Prognosis for survival: fair Plan: refer to problem list as of 01/16/13

## 2013-01-16 NOTE — Assessment & Plan Note (Signed)
Able to be off Docusate and MiraLax.

## 2013-01-16 NOTE — Assessment & Plan Note (Signed)
Continue Estrace pv 2x/wk for now, may consider dc since it may predispose to stones.      

## 2013-01-16 NOTE — Assessment & Plan Note (Signed)
Risk reduction by considering dc Estradiol.

## 2013-01-21 LAB — CBC AND DIFFERENTIAL
HCT: 36 % (ref 36–46)
Hemoglobin: 11.9 g/dL — AB (ref 12.0–16.0)
Platelets: 121 10*3/uL — AB (ref 150–399)
WBC: 7 10^3/mL

## 2013-01-22 ENCOUNTER — Ambulatory Visit (INDEPENDENT_AMBULATORY_CARE_PROVIDER_SITE_OTHER): Payer: Medicare Other | Admitting: General Surgery

## 2013-01-23 ENCOUNTER — Encounter: Payer: Self-pay | Admitting: Internal Medicine

## 2013-01-23 ENCOUNTER — Encounter: Payer: Medicare Other | Admitting: Internal Medicine

## 2013-01-29 ENCOUNTER — Non-Acute Institutional Stay (SKILLED_NURSING_FACILITY): Payer: Medicare Other | Admitting: Nurse Practitioner

## 2013-01-29 DIAGNOSIS — D649 Anemia, unspecified: Secondary | ICD-10-CM

## 2013-01-29 DIAGNOSIS — F0390 Unspecified dementia without behavioral disturbance: Secondary | ICD-10-CM

## 2013-01-29 DIAGNOSIS — K922 Gastrointestinal hemorrhage, unspecified: Secondary | ICD-10-CM

## 2013-01-29 DIAGNOSIS — M353 Polymyalgia rheumatica: Secondary | ICD-10-CM

## 2013-01-29 DIAGNOSIS — J189 Pneumonia, unspecified organism: Secondary | ICD-10-CM

## 2013-01-29 DIAGNOSIS — I509 Heart failure, unspecified: Secondary | ICD-10-CM | POA: Insufficient documentation

## 2013-01-29 DIAGNOSIS — R0902 Hypoxemia: Secondary | ICD-10-CM | POA: Insufficient documentation

## 2013-01-29 DIAGNOSIS — N952 Postmenopausal atrophic vaginitis: Secondary | ICD-10-CM

## 2013-01-29 DIAGNOSIS — I8501 Esophageal varices with bleeding: Secondary | ICD-10-CM

## 2013-01-29 NOTE — Assessment & Plan Note (Signed)
Currently taking Aricept, may f/u MMSE

## 2013-01-29 NOTE — Assessment & Plan Note (Signed)
Repeated  LFT showed persist elevated Alk phos

## 2013-01-29 NOTE — Assessment & Plan Note (Signed)
Continue Estrace pv 2x/wk for now, may consider dc since it may predispose to stones.

## 2013-01-29 NOTE — Assessment & Plan Note (Signed)
Per clinical presentation and CXR--gentle diurese the patient, daily weight.

## 2013-01-29 NOTE — Assessment & Plan Note (Signed)
Anemia, mild, Hgb 11.9 01/21/13

## 2013-01-29 NOTE — Assessment & Plan Note (Signed)
Continue with Prednisone 5mg by month.                

## 2013-01-29 NOTE — Assessment & Plan Note (Signed)
Dysphagia varices and hemorrhoids-resolved.

## 2013-01-29 NOTE — Progress Notes (Signed)
Patient ID: Tammy Myers, female   DOB: May 31, 1921, 79 y.o.   MRN: [id number]  Chief Complaint:  Chief Complaint  Patient presents with  . Medical Managment of Chronic Issues    sob     HPI:   Staff reported the patient's c/o SOB while in Rehab, denied chest pain or cough, O2 sat was corrected with O2 2lpm via [place]. Coarse lung sounds bilaterally. More ankle edema noted today, CXR 01/29/13 showed mild pulmonary vascular congestion and left mid/lower lung atelectasis vs pneumonitis. Will gentle diurese and ATB for now.  Problem List Items Addressed This Visit     ICD-9-CM   Anemia     Anemia, mild, Hgb 11.9 01/21/13    Esophageal varices with hemorrhage     Repeated  LFT showed persist elevated Alk phos      Vaginitis, atrophic     Continue Estrace pv 2x/wk for now, may consider dc since it may predispose to stones.       Dementia, in, senility     Currently taking Aricept, may f/u MMSE      Polymyalgia rheumatica     Continue with Prednisone 5mg  by month.       Hypoxemia - Primary     Onset this am while in therapy, denied chest pain, was corrected with O2 @ 2lpm via [place] from 88% to 95%, had x1 DuoNeb tx,  CXR 01/29/13 showed mild pulmonary vascular congestion, left mid/lower lung atelectasis vs PNA, no wheezes auscultated. Will start Lasix 20mg , Kcl daily, Avelox 400mg  daily for 10 days    CHF (congestive heart failure)     Per clinical presentation and CXR--gentle diurese the patient, daily weight.     HCAP (healthcare-associated pneumonia)     Left mid/lower lung--atelectasis vs PNA--Avelox 400mg  daily for 10 days.     RESOLVED: GI bleed     Dysphagia varices and hemorrhoids-resolved.          Review of Systems:  Review of Systems  Constitutional: Positive for malaise/fatigue. Negative for fever, chills, weight loss and diaphoresis.  HENT: Positive for hearing loss. Negative for ear pain, nosebleeds, congestion, sore throat and neck pain.   Eyes:  Negative for pain, discharge and redness.  Respiratory: Positive for cough (hacking) and shortness of breath. Negative for sputum production and wheezing.   Cardiovascular: Positive for leg swelling and PND. Negative for chest pain, palpitations, orthopnea and claudication.  Gastrointestinal: Positive for abdominal pain. Negative for heartburn, nausea, vomiting, diarrhea, constipation and blood in stool.  Genitourinary: Positive for frequency. Negative for dysuria, urgency, hematuria and flank pain.  Musculoskeletal: Positive for myalgias, back pain and joint pain.  Skin: Negative for itching and rash.  Neurological: Negative for dizziness, tingling, tremors, sensory change, speech change, focal weakness, seizures, loss of consciousness, weakness and headaches.  Endo/Heme/Allergies: Negative for environmental allergies and polydipsia. Bruises/bleeds easily.  Psychiatric/Behavioral: Positive for memory loss. Negative for depression and hallucinations. The patient is not nervous/anxious and does not have insomnia.      Medications: Patient's Medications  New Prescriptions   No medications on file  Previous Medications   CALCIUM-VITAMIN D (OSCAL WITH D) 500-200 MG-UNIT PER TABLET    Take 1 tablet by mouth 2 (two) times daily.   DONEPEZIL (ARICEPT) 10 MG TABLET    Take 10 mg by mouth at bedtime.   ESTRADIOL (ESTRACE) 0.1 MG/GM VAGINAL CREAM    Place 2 g vaginally once a week. Applied on Thursday nights.   FENTANYL (DURAGESIC -  DOSED MCG/HR) 25 MCG/HR    Place 1 patch (25 mcg total) onto the skin every 3 (three) days.   HYDROCODONE-ACETAMINOPHEN (VICODIN) 5-500 MG PER TABLET    Take 1 tablet by mouth every 6 (six) hours as needed for pain.   HYDROCORTISONE (ANUSOL-HC) 2.5 % RECTAL CREAM    Place rectally 3 (three) times daily.   MESALAMINE (CANASA) 1000 MG SUPPOSITORY    Place 1 suppository (1,000 mg total) rectally daily.   NADOLOL (CORGARD) 40 MG TABLET    Take 1 tablet (40 mg total) by mouth  daily.   PANTOPRAZOLE (PROTONIX) 40 MG TABLET    Take 40 mg by mouth 2 (two) times daily.   PREDNISONE (DELTASONE) 5 MG TABLET    Take 5 mg by mouth daily.     WITCH HAZEL-GLYCERIN (TUCKS) PAD    Place 1 application rectally as needed for pain.  Modified Medications   No medications on file  Discontinued Medications   No medications on file     Physical Exam: Physical Exam  Constitutional: She is oriented to person, place, and time. She appears well-developed and well-nourished. No distress.  HENT:  Head: Normocephalic and atraumatic.  Right Ear: External ear normal.  Left Ear: External ear normal.  Nose: Nose normal.  Mouth/Throat: Oropharynx is clear and moist. No oropharyngeal exudate.  Eyes: Conjunctivae and EOM are normal. Pupils are equal, round, and reactive to light. Right eye exhibits no discharge. Left eye exhibits no discharge. No scleral icterus.  Neck: Normal range of motion. Neck supple. No JVD present. No thyromegaly present.  Cardiovascular: Normal rate, regular rhythm and intact distal pulses.   Murmur heard.  Systolic murmur is present with a grade of 3/6  Pulmonary/Chest: Tachypnea noted. She is in respiratory distress. She has no decreased breath sounds. She has no wheezes. She has rhonchi (corse breath sound diffused). She has rales in the right lower field and the left lower field. She exhibits no tenderness.  Abdominal: Soft. Bowel sounds are normal. She exhibits no distension and no mass. There is no tenderness. There is no rebound and no guarding.  Genitourinary: Vagina normal. No vaginal discharge found.  Musculoskeletal: Normal range of motion. She exhibits edema (ankles). She exhibits no tenderness.  kyphosis  Lymphadenopathy:    She has no cervical adenopathy.  Neurological: She is alert and oriented to person, place, and time. She has normal reflexes. She displays normal reflexes. No cranial nerve deficit. She exhibits normal muscle tone. Coordination  normal.  Skin: Skin is warm and dry. No rash noted. She is not diaphoretic. No erythema.  Bilateral lower leg abrasions  Psychiatric: Judgment normal. Her mood appears not anxious. Her affect is not angry, not blunt, not labile and not inappropriate. Her speech is not rapid and/or pressured, not delayed, not tangential and not slurred. She is not agitated, not aggressive, not hyperactive, not slowed, not withdrawn, not actively hallucinating and not combative. Thought content is not paranoid and not delusional. Cognition and memory are impaired. She does not express impulsivity or inappropriate judgment. She does not exhibit a depressed mood. She exhibits abnormal recent memory. She exhibits normal remote memory.    Filed Vitals:   01/29/13 1200  BP: 106/68  Pulse: 79  Temp: 96.2 F (35.7 C)  Resp: 22  SpO2: 88%      Labs reviewed: Basic Metabolic Panel:  Recent Labs  91/47/82 2023 01/12/13 0930 01/14/13 0500  NA 140 139 137  K 5.1 3.5 3.5  CL  108 107 106  CO2 25 28 27   GLUCOSE 133* 104* 110*  BUN 46* 27* 23  CREATININE 0.84 0.58 0.53  CALCIUM 8.8 7.6* 7.7*    Liver Function Tests:  Recent Labs  01/09/13 2023 01/14/13 0500 01/21/13  AST 36 65* 54*  ALT 28 53* 47*  ALKPHOS 241* 348* 460*  BILITOT 0.9 1.7*  --   PROT 5.0* 4.5*  --   ALBUMIN 2.5* 2.3*  --     CBC:  Recent Labs  07/11/12 0934 01/09/13 2023  01/13/13 0830 01/14/13 0500 01/21/13  WBC 5.4 7.3  --   --  7.2 7.0  NEUTROABS  --  5.7  --   --   --   --   HGB 13.2 6.8*  < > 10.9* 11.4* 11.9*  HCT 39.1 20.0*  < > 32.9* 34.3* 36  MCV 102.1* 94.3  --   --  93.7  --   PLT 129* 143*  --   --  84* 121*  < > = values in this interval not displayed.  Anemia Panel: No results found for this basename: IRON, FOLATE, VITAMINB12,  in the last 8760 hours  Significant Diagnostic Results:   01/29/13 CXR mild pulmonary vascular congestion, patchy density at the left mid/lower lung likely atelectasis or less  likely pneumonitis.   Assessment/Plan Hypoxemia Onset this am while in therapy, denied chest pain, was corrected with O2 @ 2lpm via [place] from 88% to 95%, had x1 DuoNeb tx,  CXR 01/29/13 showed mild pulmonary vascular congestion, left mid/lower lung atelectasis vs PNA, no wheezes auscultated. Will start Lasix 20mg , Kcl daily, Avelox 400mg  daily for 10 days  Dementia, in, senility Currently taking Aricept, may f/u MMSE    Polymyalgia rheumatica Continue with Prednisone 5mg  by month.     GI bleed Dysphagia varices and hemorrhoids-resolved.     Anemia Anemia, mild, Hgb 11.9 01/21/13  CHF (congestive heart failure) Per clinical presentation and CXR--gentle diurese the patient, daily weight.   HCAP (healthcare-associated pneumonia) Left mid/lower lung--atelectasis vs PNA--Avelox 400mg  daily for 10 days.   Vaginitis, atrophic Continue Estrace pv 2x/wk for now, may consider dc since it may predispose to stones.     Esophageal varices with hemorrhage Repeated  LFT showed persist elevated Alk phos        Family/ staff Communication: monitor   Goals of care: SNF  Labs/tests ordered CXR pending.

## 2013-01-29 NOTE — Assessment & Plan Note (Signed)
Left mid/lower lung--atelectasis vs PNA--Avelox 400mg  daily for 10 days.

## 2013-01-29 NOTE — Assessment & Plan Note (Addendum)
Onset this am while in therapy, denied chest pain, was corrected with O2 @ 2lpm via [place] from 88% to 95%, had x1 DuoNeb tx,  CXR 01/29/13 showed mild pulmonary vascular congestion, left mid/lower lung atelectasis vs PNA, no wheezes auscultated. Will start Lasix 20mg , Kcl daily, Avelox 400mg  daily for 10 days

## 2013-01-30 LAB — BASIC METABOLIC PANEL
BUN: 25 mg/dL — AB (ref 4–21)
Creatinine: 0.6 mg/dL (ref 0.5–1.1)
Potassium: 4.6 mmol/L (ref 3.4–5.3)
Sodium: 140 mmol/L (ref 137–147)

## 2013-02-04 ENCOUNTER — Encounter: Payer: Self-pay | Admitting: Internal Medicine

## 2013-02-04 ENCOUNTER — Ambulatory Visit (INDEPENDENT_AMBULATORY_CARE_PROVIDER_SITE_OTHER): Payer: Medicare Other | Admitting: Internal Medicine

## 2013-02-04 VITALS — BP 100/52 | HR 68 | Ht 63.0 in | Wt 120.4 lb

## 2013-02-04 DIAGNOSIS — K2100 Gastro-esophageal reflux disease with esophagitis, without bleeding: Secondary | ICD-10-CM

## 2013-02-04 DIAGNOSIS — I8500 Esophageal varices without bleeding: Secondary | ICD-10-CM

## 2013-02-04 DIAGNOSIS — K7460 Unspecified cirrhosis of liver: Secondary | ICD-10-CM

## 2013-02-04 NOTE — Progress Notes (Signed)
HISTORY OF PRESENT ILLNESS:  Tammy Myers is a 79 y.o. female , soon to be 89, with multiple significant medical problems including hypertension, hyperlipidemia, atrial fibrillation for which she was previously on Coumadin, GERD complicated by erosive esophagitis with GI bleeding, Alzheimer's dementia, spinal stenosis, and newly diagnosed cirrhosis of the liver. She is status post cholecystectomy. She is a DO NOT RESUSCITATE.. This is the first outpatient GI visit for this patient to this practice. I saw her greater than one year ago as an unassigned hospital patient for upper GI bleeding. She was found to have erosive esophagitis at that time. She was continued on PPI therapy. Recently hospitalized with GI bleeding and underwent upper endoscopy with Dr. Stark. He found a small bleeding esophageal varix which was banded x2. She is now on nadolol. No blood thinner. She has been trying to rehabilitate. Apparently had problems with her breathing recently for which she is now on nasal cannula oxygen. GI review of systems is remarkable for chronic unchanged abdominal pain, bloating with gas, decreased appetite, and hemorrhoids. She is currently in a nursing home. She is accompanied by her sister and brother-in-law. This followup appointment was set up post hospital. Relook endoscopy suggested to the patient and her family.  REVIEW OF SYSTEMS:  All non-GI ROS negative except for back pain, confusion, cough, memory problems, fatigue, shortness of breath, nosebleeds, urinary leakage, ankle edema, hearing problems  Past Medical History  Diagnosis Date  . PMR (polymyalgia rheumatica)   . Temporal arteritis   . Osteoporosis   . Hearing loss   . Wears glasses   . Rectal bleeding   . Arthritis   . Rectal prolapse   . Hypertension   . Mitral valve prolapse   . Spinal stenosis   . Alzheimer's dementia   . Hyperlipidemia   . GERD (gastroesophageal reflux disease)   . Sciatica   . Atrial fibrillation    . Scoliosis   . Esophageal varices   . Hiatal hernia     Past Surgical History  Procedure Laterality Date  . Abdominal hysterectomy    . Appendectomy    . Colonoscopy    . Breast surgery      left  . Hernia repair      [facility]  . Esophagogastroduodenoscopy  11/02/2011    Procedure: ESOPHAGOGASTRODUODENOSCOPY (EGD);  Surgeon: Rhone Ozaki, MD;  Location: [facility];  Service: Endoscopy;  Laterality: N/A;  . Rectal prolapse repair, rectopexy  11/16/2010    with sigmoid colectomy.  Repair w biologic mesh in pelvis  . Cholecystectomy  12/12/11  . Esophagogastroduodenoscopy N/A 01/10/2013    Procedure: ESOPHAGOGASTRODUODENOSCOPY (EGD);  Surgeon: Malcolm T Stark, MD;  Location: [facility] ENDOSCOPY;  Service: Endoscopy;  Laterality: N/A;    Social History Bryce K Zaugg  reports that she has never smoked. She has never used smokeless tobacco. She reports that she does not drink alcohol or use illicit drugs.  family history includes Bladder Cancer in her brother.  Allergies  Allergen Reactions  . Caffeine   . Fosamax (Alendronate Sodium)   . Sudafed (Pseudoephedrine Hcl)        PHYSICAL EXAMINATION: Vital signs: BP 100/52  Pulse 68  Ht 5' 3" (1.6 m)  Wt 120 lb 6 oz (54.602 kg)  BMI 21.33 kg/m2  Constitutional: Chronically ill-appearing, no acute distress. Nasal cannula oxygen in place Psychiatric: alert and oriented x2, cooperative Eyes: extraocular movements intact, anicteric, conjunctiva pink Mouth: oral pharynx moist, no lesions Neck: supple no lymphadenopathy Cardiovascular:   Purulent heart rate, no murmur Lungs: clear to auscultation bilaterally Abdomen: soft, nontender, nondistended, no obvious ascites, no peritoneal signs, normal bowel sounds, no organomegaly Rectal: Omitted Extremities: 1+ lower extremity edema bilaterally Skin: no lesions on visible extremities Neuro: No focal deficits. No asterixis.   ASSESSMENT:  #1. Recent upper GI bleed secondary to esophageal varix  status post band ligation. Now on nadolol #2. History of erosive esophagitis. On PPI #3. Multiple significant medical problems including hepatic cirrhosis, atrial fibrillation, Alzheimer's dementia, and respiratory issues for which she is on nasal cannula oxygen   PLAN:  #1. We discussed the patient's medical history is extremely complicated. We discussed the pros and cons of proceeding with relook endoscopy for the purposes of additional band ligation therapy to reduce the risk of recurrent upper GI bleeding. They're very interested in this. She is high-risk. I do not think this is unreasonable given her recent issues, but nevertheless high-risk. We will set the examination up at the hospital. She should continue on PPI therapy indefinitely. Ongoing general medical care with Dr. Green. 

## 2013-02-04 NOTE — Patient Instructions (Addendum)
You have been scheduled for an endoscopy at [facility].  Please follow written instructions given to you at your visit today. If you use inhalers (even only as needed), please bring them with you on the day of your procedure.  

## 2013-02-05 ENCOUNTER — Non-Acute Institutional Stay (SKILLED_NURSING_FACILITY): Payer: Medicare Other | Admitting: Nurse Practitioner

## 2013-02-05 DIAGNOSIS — I10 Essential (primary) hypertension: Secondary | ICD-10-CM

## 2013-02-05 DIAGNOSIS — I509 Heart failure, unspecified: Secondary | ICD-10-CM

## 2013-02-05 DIAGNOSIS — F0390 Unspecified dementia without behavioral disturbance: Secondary | ICD-10-CM

## 2013-02-05 DIAGNOSIS — J189 Pneumonia, unspecified organism: Secondary | ICD-10-CM

## 2013-02-05 DIAGNOSIS — M353 Polymyalgia rheumatica: Secondary | ICD-10-CM

## 2013-02-05 NOTE — Assessment & Plan Note (Signed)
Continue with Prednisone 5mg  by month.

## 2013-02-05 NOTE — Assessment & Plan Note (Signed)
Left mid/lower lung--atelectasis vs PNA--Avelox 400mg  daily for 10 days since 01/29/13--improving.

## 2013-02-05 NOTE — Assessment & Plan Note (Signed)
Currently taking Aricept, may f/u MMSE   

## 2013-02-05 NOTE — Assessment & Plan Note (Addendum)
Per clinical presentation and CXR--gentle diurese the patient, daily weight showed 113.8, 114, 117.6, 115.5, 119.9 along with persisted BLE--will increase Furosemide to 20mg  and 40mg  alternating and f/u BMP in one week.

## 2013-02-05 NOTE — Progress Notes (Signed)
Patient ID: Tammy Myers, female   DOB: [birth year], 79 y.o.   MRN: [id number]  Chief Complaint:  Chief Complaint  Patient presents with  . Medical Managment of Chronic Issues    weight gain/edema     HPI:   Persisted ankle edema and wt gain since Furosemide 20mg  started 01/29/13 Problem List Items Addressed This Visit     ICD-9-CM   HYPERTENSION     Blood pressure controlled on Nadolol 40mg       Dementia, in, senility     Currently taking Aricept, may f/u MMSE      Polymyalgia rheumatica     Continue with Prednisone 5mg  by month.         CHF (congestive heart failure) - Primary     Per clinical presentation and CXR--gentle diurese the patient, daily weight showed 113.8, 114, 117.6, 115.5, 119.9 along with persisted BLE--will increase Furosemide to 20mg  and 40mg  alternating and f/u BMP in one week.       HCAP (healthcare-associated pneumonia)     Left mid/lower lung--atelectasis vs PNA--Avelox 400mg  daily for 10 days since 01/29/13--improving.          Review of Systems:  Review of Systems  Constitutional: Positive for malaise/fatigue. Negative for fever, chills, weight loss and diaphoresis.  HENT: Positive for hearing loss. Negative for ear pain, nosebleeds, congestion, sore throat and neck pain.   Eyes: Negative for pain, discharge and redness.  Respiratory: Positive for cough (hacking) and shortness of breath. Negative for sputum production and wheezing.   Cardiovascular: Positive for leg swelling and PND. Negative for chest pain, palpitations, orthopnea and claudication.  Gastrointestinal: Positive for abdominal pain. Negative for heartburn, nausea, vomiting, diarrhea, constipation and blood in stool.  Genitourinary: Positive for frequency. Negative for dysuria, urgency, hematuria and flank pain.  Musculoskeletal: Positive for myalgias, back pain and joint pain.  Skin: Negative for itching and rash.  Neurological: Negative for dizziness, tingling, tremors,  sensory change, speech change, focal weakness, seizures, loss of consciousness, weakness and headaches.  Endo/Heme/Allergies: Negative for environmental allergies and polydipsia. Bruises/bleeds easily.  Psychiatric/Behavioral: Positive for memory loss. Negative for depression and hallucinations. The patient is not nervous/anxious and does not have insomnia.      Medications: Patient's Medications  New Prescriptions   No medications on file  Previous Medications   AMBULATORY NON FORMULARY MEDICATION    O2 @@ 2 LPM   CALCIUM-VITAMIN D (OSCAL WITH D) 500-200 MG-UNIT PER TABLET    Take 1 tablet by mouth 2 (two) times daily.   DONEPEZIL (ARICEPT) 10 MG TABLET    Take 10 mg by mouth at bedtime.   ESTRADIOL (ESTRACE) 0.1 MG/GM VAGINAL CREAM    Place 2 g vaginally once a week. Applied on Thursday nights.   FENTANYL (DURAGESIC - DOSED MCG/HR) 25 MCG/HR    Place 1 patch (25 mcg total) onto the skin every 3 (three) days.   FUROSEMIDE (LASIX) 20 MG TABLET    Take 20 mg by mouth daily.   HYDROCODONE-ACETAMINOPHEN (VICODIN) 5-500 MG PER TABLET    Take 1 tablet by mouth every 6 (six) hours as needed for pain.   HYDROCORTISONE (ANUSOL-HC) 2.5 % RECTAL CREAM    Place rectally 3 (three) times daily.   MESALAMINE (CANASA) 1000 MG SUPPOSITORY    Place 1 suppository (1,000 mg total) rectally daily.   MOXIFLOXACIN (AVELOX) 400 MG TABLET    Take 400 mg by mouth daily.   NADOLOL (CORGARD) 40 MG TABLET    Take  1 tablet (40 mg total) by mouth daily.   PANTOPRAZOLE (PROTONIX) 40 MG TABLET    Take 40 mg by mouth 2 (two) times daily.   POTASSIUM CHLORIDE (K-DUR,KLOR-CON) 10 MEQ TABLET    Take 10 mEq by mouth daily.   PREDNISONE (DELTASONE) 5 MG TABLET    Take 5 mg by mouth daily.     SACCHAROMYCES BOULARDII (FLORASTOR) 250 MG CAPSULE    Take 250 mg by mouth 2 (two) times daily.   WITCH HAZEL-GLYCERIN (TUCKS) PAD    Place 1 application rectally as needed for pain.  Modified Medications   No medications on file   Discontinued Medications   No medications on file     Physical Exam: Physical Exam  Constitutional: She is oriented to person, place, and time. She appears well-developed and well-nourished. No distress.  HENT:  Head: Normocephalic and atraumatic.  Right Ear: External ear normal.  Left Ear: External ear normal.  Nose: Nose normal.  Mouth/Throat: Oropharynx is clear and moist. No oropharyngeal exudate.  Eyes: Conjunctivae and EOM are normal. Pupils are equal, round, and reactive to light. Right eye exhibits no discharge. Left eye exhibits no discharge. No scleral icterus.  Neck: Normal range of motion. Neck supple. No JVD present. No thyromegaly present.  Cardiovascular: Normal rate, regular rhythm and intact distal pulses.   Murmur heard.  Systolic murmur is present with a grade of 3/6  Pulmonary/Chest: Tachypnea noted. She is in respiratory distress. She has no decreased breath sounds. She has no wheezes. She has rhonchi (corse breath sound diffused). She has rales in the right lower field and the left lower field. She exhibits no tenderness.  Abdominal: Soft. Bowel sounds are normal. She exhibits no distension and no mass. There is no tenderness. There is no rebound and no guarding.  Genitourinary: Vagina normal. No vaginal discharge found.  Musculoskeletal: Normal range of motion. She exhibits edema (ankles). She exhibits no tenderness.  kyphosis  Lymphadenopathy:    She has no cervical adenopathy.  Neurological: She is alert and oriented to person, place, and time. She has normal reflexes. She displays normal reflexes. No cranial nerve deficit. She exhibits normal muscle tone. Coordination normal.  Skin: Skin is warm and dry. No rash noted. She is not diaphoretic. No erythema.  Bilateral lower leg abrasions  Psychiatric: Judgment normal. Her mood appears not anxious. Her affect is not angry, not blunt, not labile and not inappropriate. Her speech is not rapid and/or pressured, not  delayed, not tangential and not slurred. She is not agitated, not aggressive, not hyperactive, not slowed, not withdrawn, not actively hallucinating and not combative. Thought content is not paranoid and not delusional. Cognition and memory are impaired. She does not express impulsivity or inappropriate judgment. She does not exhibit a depressed mood. She exhibits abnormal recent memory. She exhibits normal remote memory.     Filed Vitals:   02/05/13 1309  BP: 142/50  Pulse: 62  Temp: 98.5 F (36.9 C)  TempSrc: Tympanic  Resp: 16      Labs reviewed: Basic Metabolic Panel:  Recent Labs  16/10/96 2023 01/12/13 0930 01/14/13 0500 01/30/13  NA 140 139 137 140  K 5.1 3.5 3.5 4.6  CL 108 107 106  --   CO2 25 28 27   --   GLUCOSE 133* 104* 110*  --   BUN 46* 27* 23 25*  CREATININE 0.84 0.58 0.53 0.6  CALCIUM 8.8 7.6* 7.7*  --     Liver Function Tests:  Recent Labs  01/09/13 2023 01/14/13 0500 01/21/13  AST 36 65* 54*  ALT 28 53* 47*  ALKPHOS 241* 348* 460*  BILITOT 0.9 1.7*  --   PROT 5.0* 4.5*  --   ALBUMIN 2.5* 2.3*  --     CBC:  Recent Labs  07/11/12 0934 01/09/13 2023  01/13/13 0830 01/14/13 0500 01/21/13  WBC 5.4 7.3  --   --  7.2 7.0  NEUTROABS  --  5.7  --   --   --   --   HGB 13.2 6.8*  < > 10.9* 11.4* 11.9*  HCT 39.1 20.0*  < > 32.9* 34.3* 36  MCV 102.1* 94.3  --   --  93.7  --   PLT 129* 143*  --   --  84* 121*  < > = values in this interval not displayed.  Anemia Panel: No results found for this basename: IRON, FOLATE, VITAMINB12,  in the last 8760 hours  Significant Diagnostic Results:     Assessment/Plan CHF (congestive heart failure) Per clinical presentation and CXR--gentle diurese the patient, daily weight showed 113.8, 114, 117.6, 115.5, 119.9 along with persisted BLE--will increase Furosemide to 20mg  and 40mg  alternating and f/u BMP in one week.     HCAP (healthcare-associated pneumonia) Left mid/lower lung--atelectasis vs  PNA--Avelox 400mg  daily for 10 days since 01/29/13--improving.     Polymyalgia rheumatica Continue with Prednisone 5mg  by month.       Dementia, in, senility Currently taking Aricept, may f/u MMSE    HYPERTENSION Blood pressure controlled on Nadolol 40mg         Family/ staff Communication: continue to monitor for weight and edema   Goals of care: SNF   Labs/tests ordered BMP in one week

## 2013-02-05 NOTE — Assessment & Plan Note (Signed)
Blood pressure controlled on Nadolol 40mg 

## 2013-02-12 ENCOUNTER — Ambulatory Visit (HOSPITAL_COMMUNITY)
Admission: RE | Admit: 2013-02-12 | Discharge: 2013-02-12 | Disposition: A | Discharge status: Home / Self Care | Payer: Medicare Other | Source: Ambulatory Visit | Attending: Internal Medicine | Admitting: Internal Medicine

## 2013-02-12 ENCOUNTER — Encounter (HOSPITAL_COMMUNITY): Payer: Self-pay | Admitting: Internal Medicine

## 2013-02-12 ENCOUNTER — Encounter (HOSPITAL_COMMUNITY)
Admission: RE | Disposition: A | Discharge status: Home / Self Care | Payer: Self-pay | Source: Ambulatory Visit | Attending: Internal Medicine

## 2013-02-12 DIAGNOSIS — G309 Alzheimer's disease, unspecified: Secondary | ICD-10-CM | POA: Insufficient documentation

## 2013-02-12 DIAGNOSIS — K209 Esophagitis, unspecified without bleeding: Secondary | ICD-10-CM | POA: Insufficient documentation

## 2013-02-12 DIAGNOSIS — M810 Age-related osteoporosis without current pathological fracture: Secondary | ICD-10-CM | POA: Insufficient documentation

## 2013-02-12 DIAGNOSIS — M353 Polymyalgia rheumatica: Secondary | ICD-10-CM | POA: Insufficient documentation

## 2013-02-12 DIAGNOSIS — Z9089 Acquired absence of other organs: Secondary | ICD-10-CM | POA: Insufficient documentation

## 2013-02-12 DIAGNOSIS — E785 Hyperlipidemia, unspecified: Secondary | ICD-10-CM | POA: Insufficient documentation

## 2013-02-12 DIAGNOSIS — K7460 Unspecified cirrhosis of liver: Secondary | ICD-10-CM | POA: Insufficient documentation

## 2013-02-12 DIAGNOSIS — K219 Gastro-esophageal reflux disease without esophagitis: Secondary | ICD-10-CM | POA: Insufficient documentation

## 2013-02-12 DIAGNOSIS — K2210 Ulcer of esophagus without bleeding: Secondary | ICD-10-CM

## 2013-02-12 DIAGNOSIS — F0280 Dementia in other diseases classified elsewhere without behavioral disturbance: Secondary | ICD-10-CM | POA: Insufficient documentation

## 2013-02-12 DIAGNOSIS — I10 Essential (primary) hypertension: Secondary | ICD-10-CM | POA: Insufficient documentation

## 2013-02-12 DIAGNOSIS — K449 Diaphragmatic hernia without obstruction or gangrene: Secondary | ICD-10-CM | POA: Insufficient documentation

## 2013-02-12 DIAGNOSIS — I4891 Unspecified atrial fibrillation: Secondary | ICD-10-CM | POA: Insufficient documentation

## 2013-02-12 DIAGNOSIS — I8500 Esophageal varices without bleeding: Secondary | ICD-10-CM | POA: Insufficient documentation

## 2013-02-12 HISTORY — PX: ESOPHAGOGASTRODUODENOSCOPY: SHX5428

## 2013-02-12 SURGERY — EGD (ESOPHAGOGASTRODUODENOSCOPY)
Anesthesia: Moderate Sedation

## 2013-02-12 MED ORDER — BUTAMBEN-TETRACAINE-BENZOCAINE 2-2-14 % EX AERO
INHALATION_SPRAY | CUTANEOUS | Status: DC | PRN
  Administered 2013-02-12: 1 via TOPICAL

## 2013-02-12 MED ORDER — MIDAZOLAM HCL 10 MG/2ML IJ SOLN
INTRAMUSCULAR | Status: DC | PRN
  Administered 2013-02-12: 1 mg via INTRAVENOUS
  Administered 2013-02-12: 2 mg via INTRAVENOUS

## 2013-02-12 MED ORDER — FENTANYL CITRATE 0.05 MG/ML IJ SOLN
INTRAMUSCULAR | Status: AC
  Filled 2013-02-12: qty 2

## 2013-02-12 MED ORDER — MIDAZOLAM HCL 10 MG/2ML IJ SOLN
INTRAMUSCULAR | Status: AC
  Filled 2013-02-12: qty 2

## 2013-02-12 MED ORDER — FENTANYL CITRATE 0.05 MG/ML IJ SOLN
INTRAMUSCULAR | Status: DC | PRN
  Administered 2013-02-12: 25 ug via INTRAVENOUS

## 2013-02-12 MED ORDER — SODIUM CHLORIDE 0.9 % IV SOLN
INTRAVENOUS | Status: DC
  Administered 2013-02-12: 08:00:00 via INTRAVENOUS

## 2013-02-12 MED ORDER — DIPHENHYDRAMINE HCL 50 MG/ML IJ SOLN
INTRAMUSCULAR | Status: AC
  Filled 2013-02-12: qty 1

## 2013-02-12 NOTE — H&P (View-Only) (Signed)
HISTORY OF PRESENT ILLNESS:  Tammy Myers is a 79 y.o. female , soon to be 32, with multiple significant medical problems including hypertension, hyperlipidemia, atrial fibrillation for which she was previously on Coumadin, GERD complicated by erosive esophagitis with GI bleeding, Alzheimer's dementia, spinal stenosis, and newly diagnosed cirrhosis of the liver. She is status post cholecystectomy. She is a DO NOT RESUSCITATE.[name] This is the first outpatient GI visit for this patient to this practice. I saw her greater than one year ago as an unassigned hospital patient for upper GI bleeding. She was found to have erosive esophagitis at that time. She was continued on PPI therapy. Recently hospitalized with GI bleeding and underwent upper endoscopy with Dr. Russella Dar. He found a small bleeding esophageal varix which was banded x2. She is now on nadolol. No blood thinner. She has been trying to rehabilitate. Apparently had problems with her breathing recently for which she is now on nasal cannula oxygen. GI review of systems is remarkable for chronic unchanged abdominal pain, bloating with gas, decreased appetite, and hemorrhoids. She is currently in a nursing home. She is accompanied by her sister and brother-in-law. This followup appointment was set up post hospital. Relook endoscopy suggested to the patient and her family.  REVIEW OF SYSTEMS:  All non-GI ROS negative except for back pain, confusion, cough, memory problems, fatigue, shortness of breath, nosebleeds, urinary leakage, ankle edema, hearing problems  Past Medical History  Diagnosis Date  . PMR (polymyalgia rheumatica)   . Temporal arteritis   . Osteoporosis   . Hearing loss   . Wears glasses   . Rectal bleeding   . Arthritis   . Rectal prolapse   . Hypertension   . Mitral valve prolapse   . Spinal stenosis   . Alzheimer's dementia   . Hyperlipidemia   . GERD (gastroesophageal reflux disease)   . Sciatica   . Atrial fibrillation    . Scoliosis   . Esophageal varices   . Hiatal hernia     Past Surgical History  Procedure Laterality Date  . Abdominal hysterectomy    . Appendectomy    . Colonoscopy    . Breast surgery      left  . Hernia repair      [facility]  . Esophagogastroduodenoscopy  11/02/2011    Procedure: ESOPHAGOGASTRODUODENOSCOPY (EGD);  Surgeon: Yancey Flemings, MD;  Location: [facility] ENDOSCOPY;  Service: Endoscopy;  Laterality: N/A;  . Rectal prolapse repair, rectopexy  11/16/2010    with sigmoid colectomy.  Repair w biologic mesh in pelvis  . Cholecystectomy  12/12/11  . Esophagogastroduodenoscopy N/A 01/10/2013    Procedure: ESOPHAGOGASTRODUODENOSCOPY (EGD);  Surgeon: Meryl Dare, MD;  Location: [facility];  Service: Endoscopy;  Laterality: N/A;    Social History Tammy Myers  reports that she has never smoked. She has never used smokeless tobacco. She reports that she does not drink alcohol or use illicit drugs.  family history includes Bladder Cancer in her brother.  Allergies  Allergen Reactions  . Caffeine   . Fosamax (Alendronate Sodium)   . Sudafed (Pseudoephedrine Hcl)        PHYSICAL EXAMINATION: Vital signs: BP 100/52  Pulse 68  Ht 5\' 3"  (1.6 m)  Wt 120 lb 6 oz (54.602 kg)  BMI 21.33 kg/m2  Constitutional: Chronically ill-appearing, no acute distress. Nasal cannula oxygen in place Psychiatric: alert and oriented x2, cooperative Eyes: extraocular movements intact, anicteric, conjunctiva pink Mouth: oral pharynx moist, no lesions Neck: supple no lymphadenopathy Cardiovascular:  Purulent heart rate, no murmur Lungs: clear to auscultation bilaterally Abdomen: soft, nontender, nondistended, no obvious ascites, no peritoneal signs, normal bowel sounds, no organomegaly Rectal: Omitted Extremities: 1+ lower extremity edema bilaterally Skin: no lesions on visible extremities Neuro: No focal deficits. No asterixis.   ASSESSMENT:  #1. Recent upper GI bleed secondary to esophageal varix  status post band ligation. Now on nadolol #2. History of erosive esophagitis. On PPI #3. Multiple significant medical problems including hepatic cirrhosis, atrial fibrillation, Alzheimer's dementia, and respiratory issues for which she is on nasal cannula oxygen   PLAN:  #1. We discussed the patient's medical history is extremely complicated. We discussed the pros and cons of proceeding with relook endoscopy for the purposes of additional band ligation therapy to reduce the risk of recurrent upper GI bleeding. They're very interested in this. She is high-risk. I do not think this is unreasonable given her recent issues, but nevertheless high-risk. We will set the examination up at the hospital. She should continue on PPI therapy indefinitely. Ongoing general medical care with Dr. Chilton Si.

## 2013-02-12 NOTE — Op Note (Signed)
[facility] [address] [place], [address]   ENDOSCOPY PROCEDURE REPORT  PATIENT: Tammy Myers, Tammy Myers  MR#: [id number] BIRTHDATE: 1921/01/25 , 89  yrs. old GENDER: Female ENDOSCOPIST: Roxy Cedar, MD REFERRED BY:  .  Self / Office PROCEDURE DATE:  02/12/2013 PROCEDURE:  EGD, diagnostic ASA CLASS:     Class III INDICATIONS:  Therapeutic procedure.  followup varices, recent banding MEDICATIONS: Fentanyl 25 mcg IV and Versed 3 mg IV TOPICAL ANESTHETIC: Cetacaine Spray  DESCRIPTION OF PROCEDURE: After the risks benefits and alternatives of the procedure were thoroughly explained, informed consent was obtained.  The PENTAX GASTOROSCOPE [id number] endoscope was introduced through the mouth and advanced to the second portion of the duodenum. Without limitations.  The instrument was slowly withdrawn as the mucosa was fully examined.      EXAM:The esophagus revealed scarring in the distal most portion from recent band placement.  Small variceal remnants present, but no stigmata or reasonable varix amenable to banding.  There was some mild but definite active esophagitis present.  The stomach revealed a large hiatal hernia but was otherwise normal.  The duodenal bulb and post bulbar duodenum were normal.  Retroflexed views revealed a hiatal hernia.     The scope was then withdrawn from the patient and the procedure completed.  COMPLICATIONS: There were no complications. ENDOSCOPIC IMPRESSION: 1. Trivial residual varices. No banding required 2. GERD with mild esophagitis  RECOMMENDATIONS: 1. Continue pantoprazole twice daily indefinitely 2. Continue nadolol 3. Return to the care of Dr. Chilton Si. GI followup as needed  REPEAT EXAM:  eSigned:  Roxy Cedar, MD 02/12/2013 9:29 AM   VH:QIONGE Chilton Si, MD and The Patient

## 2013-02-12 NOTE — Interval H&P Note (Signed)
History and Physical Interval Note:  02/12/2013 8:43 AM  Tammy Myers  has presented today for surgery, with the diagnosis of Esophageal varices [456.1]  The various methods of treatment have been discussed with the patient and family. After consideration of risks, benefits and other options for treatment, the patient has consented to  Procedure(s): ESOPHAGOGASTRODUODENOSCOPY (EGD) (N/A) as a surgical intervention .  The patient's history has been reviewed, patient examined, no change in status, stable for surgery.  I have reviewed the patient's chart and labs.  Questions were answered to the patient's satisfaction.    No interval change. EGD +/- banding planned  Brahim Dolman N. Eda Keys., M.D. [facility] Division of Gastroenterology   Yancey Flemings

## 2013-02-12 NOTE — OR Nursing (Signed)
Spoke to [place] in pharmacy regarding pt's allergies and Cetacaine throat spray, prior to scheduled endoscopy procedure with Dr. Marina Goodell.  Epic states "unspecified reaction".  Pharmacist stated pt should be fine to receive Cetacaine.  Relayed this to Dr. Marina Goodell who also agreed pt could receive Cetacaine.  Pt also received this from last EGD procedure with Dr. Russella Dar, according to his procedure note.  Will proceed with procedure and continue to monitor pt. Ennis Forts, Rn

## 2013-02-13 ENCOUNTER — Encounter (HOSPITAL_COMMUNITY): Payer: Self-pay | Admitting: Internal Medicine

## 2013-02-19 ENCOUNTER — Non-Acute Institutional Stay (SKILLED_NURSING_FACILITY): Payer: Medicare Other | Admitting: Nurse Practitioner

## 2013-02-19 DIAGNOSIS — M353 Polymyalgia rheumatica: Secondary | ICD-10-CM

## 2013-02-19 DIAGNOSIS — R188 Other ascites: Secondary | ICD-10-CM

## 2013-02-19 DIAGNOSIS — I8501 Esophageal varices with bleeding: Secondary | ICD-10-CM

## 2013-02-19 DIAGNOSIS — K8010 Calculus of gallbladder with chronic cholecystitis without obstruction: Secondary | ICD-10-CM

## 2013-02-19 NOTE — Progress Notes (Signed)
Patient ID: Tammy Myers, female   DOB: 24-Feb-1921, 79 y.o.   MRN: [id number]  Chief Complaint:  Chief Complaint  Patient presents with  . Medical Managment of Chronic Issues     HPI:   Problem List Items Addressed This Visit   Chronic cholecystitis with calculus     Risk reduction by considering dc Estradiol. Pain is managed with Fentanyl and Vicodin q6hr prn.       Esophageal varices with hemorrhage - Primary     Repeated  LFT showed persist elevated Alk phos, Endoscopy 02/12/13 per Dr. Marina Goodell: GERD with mild esophagitis, Trivial residual varices, no banding required. Continue Pantoprazole bid and Nadolol.         Polymyalgia rheumatica     Continue with Prednisone 5mg  by month.           Ascites (Chronic)     02/06/13 [place] abd showed free intraperitoneal fluid/ascites is seen in all four quadrants of the abdomen to a moderate to large extent, bilateral pleural effusion are noted as well. Furosemide 20mg  and 40mg  alternating dose since 02/05/13. Bun/creat 17/0.50 02/13/13.        Review of Systems:  Review of Systems  Constitutional: Positive for malaise/fatigue. Negative for fever, chills, weight loss and diaphoresis.  HENT: Positive for hearing loss. Negative for ear pain, nosebleeds, congestion, sore throat and neck pain.   Eyes: Negative for pain, discharge and redness.  Respiratory: Positive for cough (hacking) and shortness of breath. Negative for sputum production and wheezing.   Cardiovascular: Positive for leg swelling and PND. Negative for chest pain, palpitations, orthopnea and claudication.  Gastrointestinal: Positive for abdominal pain. Negative for heartburn, nausea, vomiting, diarrhea, constipation and blood in stool.  Genitourinary: Positive for frequency. Negative for dysuria, urgency, hematuria and flank pain.  Musculoskeletal: Positive for myalgias, back pain and joint pain.  Skin: Negative for itching and rash.  Neurological: Negative for  dizziness, tingling, tremors, sensory change, speech change, focal weakness, seizures, loss of consciousness, weakness and headaches.  Endo/Heme/Allergies: Negative for environmental allergies and polydipsia. Bruises/bleeds easily.  Psychiatric/Behavioral: Positive for memory loss. Negative for depression and hallucinations. The patient is not nervous/anxious and does not have insomnia.      Medications: Patient's Medications  New Prescriptions   No medications on file  Previous Medications   AMBULATORY NON FORMULARY MEDICATION    O2 @@ 2 LPM   CALCIUM-VITAMIN D (OSCAL WITH D) 500-200 MG-UNIT PER TABLET    Take 1 tablet by mouth 2 (two) times daily.   DONEPEZIL (ARICEPT) 10 MG TABLET    Take 10 mg by mouth at bedtime.   ESTRADIOL (ESTRACE) 0.1 MG/GM VAGINAL CREAM    Place 2 g vaginally once a week. Applied on Thursday nights.   FENTANYL (DURAGESIC - DOSED MCG/HR) 25 MCG/HR    Place 1 patch (25 mcg total) onto the skin every 3 (three) days.   FUROSEMIDE (LASIX) 20 MG TABLET    Take 20 mg by mouth daily.   HYDROCODONE-ACETAMINOPHEN (VICODIN) 5-500 MG PER TABLET    Take 1 tablet by mouth every 6 (six) hours as needed for pain.   HYDROCORTISONE (ANUSOL-HC) 2.5 % RECTAL CREAM    Place rectally 3 (three) times daily.   MESALAMINE (CANASA) 1000 MG SUPPOSITORY    Place 1 suppository (1,000 mg total) rectally daily.   MOXIFLOXACIN (AVELOX) 400 MG TABLET    Take 400 mg by mouth daily.   NADOLOL (CORGARD) 40 MG TABLET    Take 1  tablet (40 mg total) by mouth daily.   PANTOPRAZOLE (PROTONIX) 40 MG TABLET    Take 40 mg by mouth 2 (two) times daily.   POTASSIUM CHLORIDE (K-DUR,KLOR-CON) 10 MEQ TABLET    Take 10 mEq by mouth daily.   PREDNISONE (DELTASONE) 5 MG TABLET    Take 5 mg by mouth daily.     SACCHAROMYCES BOULARDII (FLORASTOR) 250 MG CAPSULE    Take 250 mg by mouth 2 (two) times daily.   WITCH HAZEL-GLYCERIN (TUCKS) PAD    Place 1 application rectally as needed for pain.  Modified Medications    No medications on file  Discontinued Medications   No medications on file     Physical Exam: Physical Exam  Constitutional: She is oriented to person, place, and time. She appears well-developed and well-nourished. No distress.  HENT:  Head: Normocephalic and atraumatic.  Right Ear: External ear normal.  Left Ear: External ear normal.  Nose: Nose normal.  Mouth/Throat: Oropharynx is clear and moist. No oropharyngeal exudate.  Eyes: Conjunctivae and EOM are normal. Pupils are equal, round, and reactive to light. Right eye exhibits no discharge. Left eye exhibits no discharge. No scleral icterus.  Neck: Normal range of motion. Neck supple. No JVD present. No thyromegaly present.  Cardiovascular: Normal rate, regular rhythm and intact distal pulses.   Murmur heard.  Systolic murmur is present with a grade of 3/6  Pulmonary/Chest: Tachypnea noted. She is in respiratory distress. She has no decreased breath sounds. She has no wheezes. She has rhonchi (corse breath sound diffused). She has rales in the right lower field and the left lower field. She exhibits no tenderness.  Abdominal: Soft. Bowel sounds are normal. She exhibits no distension and no mass. There is no tenderness. There is no rebound and no guarding.  Genitourinary: Vagina normal. No vaginal discharge found.  Musculoskeletal: Normal range of motion. She exhibits edema (ankles). She exhibits no tenderness.  kyphosis  Lymphadenopathy:    She has no cervical adenopathy.  Neurological: She is alert and oriented to person, place, and time. She has normal reflexes. She displays normal reflexes. No cranial nerve deficit. She exhibits normal muscle tone. Coordination normal.  Skin: Skin is warm and dry. No rash noted. She is not diaphoretic. No erythema.  Bilateral lower leg abrasions  Psychiatric: Judgment normal. Her mood appears not anxious. Her affect is not angry, not blunt, not labile and not inappropriate. Her speech is not rapid  and/or pressured, not delayed, not tangential and not slurred. She is not agitated, not aggressive, not hyperactive, not slowed, not withdrawn, not actively hallucinating and not combative. Thought content is not paranoid and not delusional. Cognition and memory are impaired. She does not express impulsivity or inappropriate judgment. She does not exhibit a depressed mood. She exhibits abnormal recent memory. She exhibits normal remote memory.     Filed Vitals:   02/20/13 1609  BP: 138/84  Pulse: 66  Temp: 97.6 F (36.4 C)  TempSrc: Tympanic  Resp: 20      Labs reviewed: Basic Metabolic Panel:  Recent Labs  11/91/47 2023 01/12/13 0930 01/14/13 0500 01/30/13 02/13/13  NA 140 139 137 140 139  K 5.1 3.5 3.5 4.6 4.0  CL 108 107 106  --   --   CO2 25 28 27   --   --   GLUCOSE 133* 104* 110*  --   --   BUN 46* 27* 23 25* 17  CREATININE 0.84 0.58 0.53 0.6 0.5  CALCIUM  8.8 7.6* 7.7*  --   --     Liver Function Tests:  Recent Labs  01/09/13 2023 01/14/13 0500 01/21/13  AST 36 65* 54*  ALT 28 53* 47*  ALKPHOS 241* 348* 460*  BILITOT 0.9 1.7*  --   PROT 5.0* 4.5*  --   ALBUMIN 2.5* 2.3*  --     CBC:  Recent Labs  07/11/12 0934 01/09/13 2023  01/13/13 0830 01/14/13 0500 01/21/13  WBC 5.4 7.3  --   --  7.2 7.0  NEUTROABS  --  5.7  --   --   --   --   HGB 13.2 6.8*  < > 10.9* 11.4* 11.9*  HCT 39.1 20.0*  < > 32.9* 34.3* 36  MCV 102.1* 94.3  --   --  93.7  --   PLT 129* 143*  --   --  84* 121*  < > = values in this interval not displayed.  Anemia Panel: No results found for this basename: IRON, FOLATE, VITAMINB12,  in the last 8760 hours  Significant Diagnostic Results:  02/12/13   Endoscopy: Dr. Yancey Flemings: continue pantoprazole bid and Nadolol. Trivial residual varices, no banding required, GERD with mild esophagitis.    Assessment/Plan Esophageal varices with hemorrhage Repeated  LFT showed persist elevated Alk phos, Endoscopy 02/12/13 per Dr. Marina Goodell: GERD  with mild esophagitis, Trivial residual varices, no banding required. Continue Pantoprazole bid and Nadolol.       Ascites 02/06/13 [place] abd showed free intraperitoneal fluid/ascites is seen in all four quadrants of the abdomen to a moderate to large extent, bilateral pleural effusion are noted as well. Furosemide 20mg  and 40mg  alternating dose since 02/05/13. Bun/creat 17/0.50 02/13/13.   Polymyalgia rheumatica Continue with Prednisone 5mg  by month.         Chronic cholecystitis with calculus Risk reduction by considering dc Estradiol. Pain is managed with Fentanyl and Vicodin q6hr prn.         Family/ staff Communication: monitor the patient's weight  Goals of care: SNF   Labs/tests ordered none

## 2013-02-20 NOTE — Assessment & Plan Note (Signed)
Continue with Prednisone 5mg by month.                

## 2013-02-20 NOTE — Assessment & Plan Note (Signed)
Repeated  LFT showed persist elevated Alk phos, Endoscopy 02/12/13 per Dr. Marina Goodell: GERD with mild esophagitis, Trivial residual varices, no banding required. Continue Pantoprazole bid and Nadolol.

## 2013-02-20 NOTE — Assessment & Plan Note (Signed)
02/06/13 [place] abd showed free intraperitoneal fluid/ascites is seen in all four quadrants of the abdomen to a moderate to large extent, bilateral pleural effusion are noted as well. Furosemide 20mg  and 40mg  alternating dose since 02/05/13. Bun/creat 17/0.50 02/13/13.

## 2013-02-20 NOTE — Assessment & Plan Note (Addendum)
Risk reduction by considering dc Estradiol. Pain is managed with Fentanyl 25mcg and Vicodin q6hr prn.      

## 2013-02-27 ENCOUNTER — Encounter: Payer: Medicare Other | Admitting: Nurse Practitioner

## 2013-03-17 ENCOUNTER — Non-Acute Institutional Stay (SKILLED_NURSING_FACILITY): Payer: Medicare Other | Admitting: Nurse Practitioner

## 2013-03-17 DIAGNOSIS — I4891 Unspecified atrial fibrillation: Secondary | ICD-10-CM

## 2013-03-17 DIAGNOSIS — M353 Polymyalgia rheumatica: Secondary | ICD-10-CM

## 2013-03-17 DIAGNOSIS — R109 Unspecified abdominal pain: Secondary | ICD-10-CM

## 2013-03-17 DIAGNOSIS — Z66 Do not resuscitate: Secondary | ICD-10-CM

## 2013-03-17 DIAGNOSIS — I8501 Esophageal varices with bleeding: Secondary | ICD-10-CM

## 2013-03-17 NOTE — Assessment & Plan Note (Signed)
Risk reduction by considering dc Estradiol. Pain is managed with Fentanyl and Vicodin q6hr prn.

## 2013-03-17 NOTE — Assessment & Plan Note (Signed)
Rate controlled, off ASA 2nd to GI bleed. Takes Nadolol 40mg 

## 2013-03-17 NOTE — Assessment & Plan Note (Signed)
Continue with Prednisone 5mg by month.                

## 2013-03-17 NOTE — Progress Notes (Signed)
Patient ID: Tammy Myers, female   DOB: [birth year], 79 y.o.   MRN: [id number]  Chief Complaint:  Chief Complaint  Patient presents with  . Medical Managment of Chronic Issues     HPI:    Problem List Items Addressed This Visit   ATRIAL FIBRILLATION, PAROXYSMAL     Rate controlled, off ASA 2nd to GI bleed. Takes Nadolol 40mg        Abdominal pain     Risk reduction by considering dc Estradiol. Pain is managed with Fentanyl and Vicodin q6hr prn.         Esophageal varices with hemorrhage     Repeated  LFT showed persist elevated Alk phos, takes PPI        Polymyalgia rheumatica     Continue with Prednisone 5mg  by month.             DNR (do not resuscitate) - Primary      Review of Systems:  Review of Systems  Constitutional: Positive for malaise/fatigue. Negative for fever, chills, weight loss and diaphoresis.  HENT: Positive for hearing loss. Negative for ear pain, nosebleeds, congestion, sore throat and neck pain.   Eyes: Negative for pain, discharge and redness.  Respiratory: Positive for cough (hacking) and shortness of breath. Negative for sputum production and wheezing.   Cardiovascular: Positive for leg swelling and PND. Negative for chest pain, palpitations, orthopnea and claudication.  Gastrointestinal: Positive for abdominal pain. Negative for heartburn, nausea, vomiting, diarrhea, constipation and blood in stool.  Genitourinary: Positive for frequency. Negative for dysuria, urgency, hematuria and flank pain.  Musculoskeletal: Positive for myalgias, back pain and joint pain.  Skin: Negative for itching and rash.  Neurological: Negative for dizziness, tingling, tremors, sensory change, speech change, focal weakness, seizures, loss of consciousness, weakness and headaches.  Endo/Heme/Allergies: Negative for environmental allergies and polydipsia. Bruises/bleeds easily.  Psychiatric/Behavioral: Positive for memory loss. Negative for depression  and hallucinations. The patient is not nervous/anxious and does not have insomnia.      Medications: Patient's Medications  New Prescriptions   No medications on file  Previous Medications   AMBULATORY NON FORMULARY MEDICATION    O2 @@ 2 LPM   CALCIUM-VITAMIN D (OSCAL WITH D) 500-200 MG-UNIT PER TABLET    Take 1 tablet by mouth 2 (two) times daily.   DONEPEZIL (ARICEPT) 10 MG TABLET    Take 10 mg by mouth at bedtime.   ESTRADIOL (ESTRACE) 0.1 MG/GM VAGINAL CREAM    Place 2 g vaginally once a week. Applied on Thursday nights.   FENTANYL (DURAGESIC - DOSED MCG/HR) 25 MCG/HR    Place 1 patch (25 mcg total) onto the skin every 3 (three) days.   FUROSEMIDE (LASIX) 20 MG TABLET    Take 20 mg by mouth daily.   HYDROCODONE-ACETAMINOPHEN (VICODIN) 5-500 MG PER TABLET    Take 1 tablet by mouth every 6 (six) hours as needed for pain.   HYDROCORTISONE (ANUSOL-HC) 2.5 % RECTAL CREAM    Place rectally 3 (three) times daily.   MESALAMINE (CANASA) 1000 MG SUPPOSITORY    Place 1 suppository (1,000 mg total) rectally daily.   MOXIFLOXACIN (AVELOX) 400 MG TABLET    Take 400 mg by mouth daily.   NADOLOL (CORGARD) 40 MG TABLET    Take 1 tablet (40 mg total) by mouth daily.   PANTOPRAZOLE (PROTONIX) 40 MG TABLET    Take 40 mg by mouth 2 (two) times daily.   POTASSIUM CHLORIDE (K-DUR,KLOR-CON) 10 MEQ TABLET  Take 10 mEq by mouth daily.   PREDNISONE (DELTASONE) 5 MG TABLET    Take 5 mg by mouth daily.     SACCHAROMYCES BOULARDII (FLORASTOR) 250 MG CAPSULE    Take 250 mg by mouth 2 (two) times daily.   WITCH HAZEL-GLYCERIN (TUCKS) PAD    Place 1 application rectally as needed for pain.  Modified Medications   No medications on file  Discontinued Medications   No medications on file     Physical Exam: Physical Exam  Constitutional: She is oriented to person, place, and time. She appears well-developed and well-nourished. No distress.  HENT:  Head: Normocephalic and atraumatic.  Right Ear: External ear  normal.  Left Ear: External ear normal.  Nose: Nose normal.  Mouth/Throat: Oropharynx is clear and moist. No oropharyngeal exudate.  Eyes: Conjunctivae and EOM are normal. Pupils are equal, round, and reactive to light. Right eye exhibits no discharge. Left eye exhibits no discharge. No scleral icterus.  Neck: Normal range of motion. Neck supple. No JVD present. No thyromegaly present.  Cardiovascular: Normal rate, regular rhythm and intact distal pulses.   Murmur heard.  Systolic murmur is present with a grade of 3/6  Pulmonary/Chest: Tachypnea noted. No respiratory distress. She has no decreased breath sounds. She has no wheezes. She has rhonchi (corse breath sound diffused). She has rales in the right lower field and the left lower field. She exhibits no tenderness.  Abdominal: Soft. Bowel sounds are normal. She exhibits no distension and no mass. There is no tenderness. There is no rebound and no guarding.  Genitourinary: Vagina normal. No vaginal discharge found.  Musculoskeletal: Normal range of motion. She exhibits no tenderness. Edema: ankles-improved.  kyphosis  Lymphadenopathy:    She has no cervical adenopathy.  Neurological: She is alert and oriented to person, place, and time. She has normal reflexes. She displays normal reflexes. No cranial nerve deficit. She exhibits normal muscle tone. Coordination normal.  Skin: Skin is warm and dry. No rash noted. She is not diaphoretic. No erythema.  Bilateral lower leg abrasions  Psychiatric: Judgment normal. Her mood appears not anxious. Her affect is not angry, not blunt, not labile and not inappropriate. Her speech is not rapid and/or pressured, not delayed, not tangential and not slurred. She is not agitated, not aggressive, not hyperactive, not slowed, not withdrawn, not actively hallucinating and not combative. Thought content is not paranoid and not delusional. Cognition and memory are impaired. She does not express impulsivity or  inappropriate judgment. She does not exhibit a depressed mood. She exhibits abnormal recent memory. She exhibits normal remote memory.       Filed Vitals:   03/17/13 1720  BP: 140/60  Pulse: 60  Temp: 97 F (36.1 C)  TempSrc: Tympanic  Resp: 16      Labs reviewed: Basic Metabolic Panel:  Recent Labs  16/10/96 2023 01/12/13 0930 01/14/13 0500 01/30/13 02/13/13  NA 140 139 137 140 139  K 5.1 3.5 3.5 4.6 4.0  CL 108 107 106  --   --   CO2 25 28 27   --   --   GLUCOSE 133* 104* 110*  --   --   BUN 46* 27* 23 25* 17  CREATININE 0.84 0.58 0.53 0.6 0.5  CALCIUM 8.8 7.6* 7.7*  --   --     Liver Function Tests:  Recent Labs  01/09/13 2023 01/14/13 0500 01/21/13  AST 36 65* 54*  ALT 28 53* 47*  ALKPHOS 241* 348* 460*  BILITOT 0.9 1.7*  --   PROT 5.0* 4.5*  --   ALBUMIN 2.5* 2.3*  --     CBC:  Recent Labs  07/11/12 0934 01/09/13 2023  01/13/13 0830 01/14/13 0500 01/21/13  WBC 5.4 7.3  --   --  7.2 7.0  NEUTROABS  --  5.7  --   --   --   --   HGB 13.2 6.8*  < > 10.9* 11.4* 11.9*  HCT 39.1 20.0*  < > 32.9* 34.3* 36  MCV 102.1* 94.3  --   --  93.7  --   PLT 129* 143*  --   --  84* 121*  < > = values in this interval not displayed.  Anemia Panel: No results found for this basename: IRON, FOLATE, VITAMINB12,  in the last 8760 hours  Significant Diagnostic Results:     Assessment/Plan Abdominal pain Risk reduction by considering dc Estradiol. Pain is managed with Fentanyl and Vicodin q6hr prn.       CHF (congestive heart failure) Per clinical presentation and CXR--gentle diurese the patient, daily weight showed 113.8, 114, 117.6, 115.5, 119.9 along with persisted BLE--better with  Furosemide 20mg  and 40mg  alternating       ATRIAL FIBRILLATION, PAROXYSMAL Rate controlled, off ASA 2nd to GI bleed. Takes Nadolol 40mg      Polymyalgia rheumatica Continue with Prednisone 5mg  by month.           Esophageal varices with  hemorrhage Repeated  LFT showed persist elevated Alk phos, takes PPI      Dementia, in, senility Currently taking Aricept, SNF for care         Family/ staff Communication: observe the patient   Goals of care: SNF   Labs/tests ordered none

## 2013-03-17 NOTE — Assessment & Plan Note (Signed)
Repeated  LFT showed persist elevated Alk phos, takes PPI

## 2013-03-17 NOTE — Assessment & Plan Note (Signed)
Currently taking Aricept, SNF for care

## 2013-03-17 NOTE — Assessment & Plan Note (Signed)
Per clinical presentation and CXR--gentle diurese the patient, daily weight showed 113.8, 114, 117.6, 115.5, 119.9 along with persisted BLE--better with  Furosemide 20mg  and 40mg  alternating

## 2013-04-15 ENCOUNTER — Telehealth (INDEPENDENT_AMBULATORY_CARE_PROVIDER_SITE_OTHER): Payer: Self-pay | Admitting: General Surgery

## 2013-04-15 NOTE — Telephone Encounter (Signed)
Yes, PRN would be fine.  I will be glad to refill as needed.

## 2013-04-15 NOTE — Telephone Encounter (Signed)
Patient's sister called to tell us that patient's hemorrhoids are doing much better and to ask if she should continue the suppositories as needed. I advised her that if she is no longer having problems to continue them as needed. She will call with any other questions.

## 2013-04-16 ENCOUNTER — Encounter: Payer: Self-pay | Admitting: Nurse Practitioner

## 2013-04-16 ENCOUNTER — Non-Acute Institutional Stay (SKILLED_NURSING_FACILITY): Payer: Medicare Other | Admitting: Nurse Practitioner

## 2013-04-16 DIAGNOSIS — D649 Anemia, unspecified: Secondary | ICD-10-CM

## 2013-04-16 DIAGNOSIS — I8501 Esophageal varices with bleeding: Secondary | ICD-10-CM

## 2013-04-16 DIAGNOSIS — K649 Unspecified hemorrhoids: Secondary | ICD-10-CM

## 2013-04-16 DIAGNOSIS — Z66 Do not resuscitate: Secondary | ICD-10-CM

## 2013-04-16 DIAGNOSIS — R109 Unspecified abdominal pain: Secondary | ICD-10-CM

## 2013-04-16 DIAGNOSIS — N952 Postmenopausal atrophic vaginitis: Secondary | ICD-10-CM

## 2013-04-16 DIAGNOSIS — M353 Polymyalgia rheumatica: Secondary | ICD-10-CM

## 2013-04-16 DIAGNOSIS — R188 Other ascites: Secondary | ICD-10-CM

## 2013-04-16 NOTE — Assessment & Plan Note (Signed)
02/06/13 [place] abd showed free intraperitoneal fluid/ascites is seen in all four quadrants of the abdomen to a moderate to large extent, bilateral pleural effusion are noted as well. Furosemide 20mg  and 40mg  alternating dose since 02/05/13. Bun/creat 17/0.50 02/13/13. Update CMP. Weight loss: # 114, 119, 107, 104 in the past 4 months.

## 2013-04-16 NOTE — Assessment & Plan Note (Signed)
Repeated  LFT showed persist elevated Alk phos, takes PPI, endoscopy 02/12/13 trivial residual varices, no banding required, GERD with mild esophagitis. Recommended continue Nadolol and Pantoprazole bid, GI f/u as needed.

## 2013-04-16 NOTE — Progress Notes (Signed)
Patient ID: Tammy Myers, female   DOB: 07/04/1921, 79 y.o.   MRN: [id number] Code Status: DNR  Allergies  Allergen Reactions  . Caffeine   . Fosamax (Alendronate Sodium)   . Sudafed (Pseudoephedrine Hcl)     Chief Complaint  Patient presents with  . Medical Managment of Chronic Issues    HPI: Patient is a 79 y.o. female seen in the SNF at [facility] today for evaluation of her chronic medical conditions.  Problem List Items Addressed This Visit   Abdominal pain     Risk reduction by considering dc Estradiol. Pain is managed with Fentanyl and Vicodin q6hr prn.           Anemia     Anemia, mild, Hgb 11.9 01/21/13, update date CBC and TSH      Ascites (Chronic)     02/06/13 [place] abd showed free intraperitoneal fluid/ascites is seen in all four quadrants of the abdomen to a moderate to large extent, bilateral pleural effusion are noted as well. Furosemide 20mg  and 40mg  alternating dose since 02/05/13. Bun/creat 17/0.50 02/13/13. Update CMP. Weight loss: # 114, 119, 107, 104 in the past 4 months.       DNR (do not resuscitate)     Currently taking Aricept, SNF for care          Esophageal varices with hemorrhage - Primary     Repeated  LFT showed persist elevated Alk phos, takes PPI, endoscopy 02/12/13 trivial residual varices, no banding required, GERD with mild esophagitis. Recommended continue Nadolol and Pantoprazole bid, GI f/u as needed.           Hemorrhoids     No bleeding presently.  Treated with Hydrocortisone rectal cream prn, TUCKs pad, Mesalamine suppository rectal daily      Polymyalgia rheumatica     Continue with Prednisone 5mg  by month.               Vaginitis, atrophic     Continue Estrace pv 2x/wk for now, may consider dc since it may predispose to stones.            Review of Systems:  Review of Systems  Constitutional: Positive for malaise/fatigue. Negative for fever, chills, weight loss and diaphoresis.   HENT: Positive for hearing loss. Negative for ear pain, nosebleeds, congestion, sore throat and neck pain.   Eyes: Negative for pain, discharge and redness.  Respiratory: Positive for cough (hacking) and shortness of breath. Negative for sputum production and wheezing.   Cardiovascular: Positive for leg swelling and PND. Negative for chest pain, palpitations, orthopnea and claudication.  Gastrointestinal: Positive for abdominal pain. Negative for heartburn, nausea, vomiting, diarrhea, constipation and blood in stool.  Genitourinary: Positive for frequency. Negative for dysuria, urgency, hematuria and flank pain.  Musculoskeletal: Positive for myalgias, back pain and joint pain.  Skin: Negative for itching and rash.  Neurological: Negative for dizziness, tingling, tremors, sensory change, speech change, focal weakness, seizures, loss of consciousness, weakness and headaches.  Endo/Heme/Allergies: Negative for environmental allergies and polydipsia. Bruises/bleeds easily.  Psychiatric/Behavioral: Positive for memory loss. Negative for depression and hallucinations. The patient is not nervous/anxious and does not have insomnia.      Past Medical History  Diagnosis Date  . PMR (polymyalgia rheumatica)   . Temporal arteritis   . Osteoporosis   . Hearing loss   . Wears glasses   . Rectal bleeding   . Arthritis   . Rectal prolapse   . Hypertension   .  Mitral valve prolapse   . Spinal stenosis   . Alzheimer's dementia   . Hyperlipidemia   . GERD (gastroesophageal reflux disease)   . Sciatica   . Scoliosis   . Esophageal varices   . Hiatal hernia   . Atrial fibrillation    Past Surgical History  Procedure Laterality Date  . Abdominal hysterectomy    . Appendectomy    . Colonoscopy    . Breast surgery      left  . Hernia repair      LIH  . Esophagogastroduodenoscopy  11/02/2011    Procedure: ESOPHAGOGASTRODUODENOSCOPY (EGD);  Surgeon: Yancey Flemings, MD;  Location: [facility] ENDOSCOPY;   Service: Endoscopy;  Laterality: N/A;  . Rectal prolapse repair, rectopexy  11/16/2010    with sigmoid colectomy.  Repair w biologic mesh in pelvis  . Cholecystectomy  12/12/11  . Esophagogastroduodenoscopy N/A 01/10/2013    Procedure: ESOPHAGOGASTRODUODENOSCOPY (EGD);  Surgeon: Meryl Dare, MD;  Location: [facility] ENDOSCOPY;  Service: Endoscopy;  Laterality: N/A;  . Esophagogastroduodenoscopy N/A 02/12/2013    Procedure: ESOPHAGOGASTRODUODENOSCOPY (EGD);  Surgeon: Hilarie Fredrickson, MD;  Location: [facility] ENDOSCOPY;  Service: Endoscopy;  Laterality: N/A;   Social History:   reports that she has never smoked. She has never used smokeless tobacco. She reports that she does not drink alcohol or use illicit drugs.  Family History  Problem Relation Age of Onset  . Bladder Cancer Brother     Medications: Reviewed at [facility]   Physical Exam: Physical Exam  Constitutional: She is oriented to person, place, and time. She appears well-developed and well-nourished. No distress.  HENT:  Head: Normocephalic and atraumatic.  Right Ear: External ear normal.  Left Ear: External ear normal.  Nose: Nose normal.  Mouth/Throat: Oropharynx is clear and moist. No oropharyngeal exudate.  Eyes: Conjunctivae and EOM are normal. Pupils are equal, round, and reactive to light. Right eye exhibits no discharge. Left eye exhibits no discharge. No scleral icterus.  Neck: Normal range of motion. Neck supple. No JVD present. No thyromegaly present.  Cardiovascular: Normal rate, regular rhythm and intact distal pulses.   Murmur heard.  Systolic murmur is present with a grade of 3/6  Pulmonary/Chest: Tachypnea noted. No respiratory distress. She has no decreased breath sounds. She has no wheezes. She has rhonchi (corse breath sound diffused). She has rales in the right lower field and the left lower field. She exhibits no tenderness.  Abdominal: Soft. Bowel sounds are normal. She exhibits no distension and no mass. There is no  tenderness. There is no rebound and no guarding.  Genitourinary: Vagina normal. No vaginal discharge found.  Musculoskeletal: Normal range of motion. She exhibits no tenderness. Edema: ankles-improved.  kyphosis  Lymphadenopathy:    She has no cervical adenopathy.  Neurological: She is alert and oriented to person, place, and time. She has normal reflexes. She displays normal reflexes. No cranial nerve deficit. She exhibits normal muscle tone. Coordination normal.  Skin: Skin is warm and dry. No rash noted. She is not diaphoretic. No erythema.  Bilateral lower leg abrasions  Psychiatric: Judgment normal. Her mood appears not anxious. Her affect is not angry, not blunt, not labile and not inappropriate. Her speech is not rapid and/or pressured, not delayed, not tangential and not slurred. She is not agitated, not aggressive, not hyperactive, not slowed, not withdrawn, not actively hallucinating and not combative. Thought content is not paranoid and not delusional. Cognition and memory are impaired. She does not express impulsivity or inappropriate judgment. She  does not exhibit a depressed mood. She exhibits abnormal recent memory. She exhibits normal remote memory.    Filed Vitals:   04/16/13 1326  BP: 160/82  Pulse: 74  Temp: 97 F (36.1 C)  TempSrc: Tympanic  Resp: 24      Labs reviewed: Basic Metabolic Panel:  Recent Labs  13/08/65 2023 01/12/13 0930 01/14/13 0500 01/30/13 02/13/13  NA 140 139 137 140 139  K 5.1 3.5 3.5 4.6 4.0  CL 108 107 106  --   --   CO2 25 28 27   --   --   GLUCOSE 133* 104* 110*  --   --   BUN 46* 27* 23 25* 17  CREATININE 0.84 0.58 0.53 0.6 0.5  CALCIUM 8.8 7.6* 7.7*  --   --    Liver Function Tests:  Recent Labs  01/09/13 2023 01/14/13 0500 01/21/13  AST 36 65* 54*  ALT 28 53* 47*  ALKPHOS 241* 348* 460*  BILITOT 0.9 1.7*  --   PROT 5.0* 4.5*  --   ALBUMIN 2.5* 2.3*  --     Recent Labs  01/09/13 2023  LIPASE 43   No results found  for this basename: AMMONIA,  in the last 8760 hours CBC:  Recent Labs  07/11/12 0934 01/09/13 2023  01/13/13 0830 01/14/13 0500 01/21/13  WBC 5.4 7.3  --   --  7.2 7.0  NEUTROABS  --  5.7  --   --   --   --   HGB 13.2 6.8*  < > 10.9* 11.4* 11.9*  HCT 39.1 20.0*  < > 32.9* 34.3* 36  MCV 102.1* 94.3  --   --  93.7  --   PLT 129* 143*  --   --  84* 121*  < > = values in this interval not displayed.      Assessment/Plan Esophageal varices with hemorrhage Repeated  LFT showed persist elevated Alk phos, takes PPI, endoscopy 02/12/13 trivial residual varices, no banding required, GERD with mild esophagitis. Recommended continue Nadolol and Pantoprazole bid, GI f/u as needed.         Hemorrhoids No bleeding presently.  Treated with Hydrocortisone rectal cream prn, TUCKs pad, Mesalamine suppository rectal daily    Ascites 02/06/13 [place] abd showed free intraperitoneal fluid/ascites is seen in all four quadrants of the abdomen to a moderate to large extent, bilateral pleural effusion are noted as well. Furosemide 20mg  and 40mg  alternating dose since 02/05/13. Bun/creat 17/0.50 02/13/13. Update CMP. Weight loss: # 114, 119, 107, 104 in the past 4 months.     Abdominal pain Risk reduction by considering dc Estradiol. Pain is managed with Fentanyl and Vicodin q6hr prn.         DNR (do not resuscitate) Currently taking Aricept, SNF for care        Polymyalgia rheumatica Continue with Prednisone 5mg  by month.             Vaginitis, atrophic Continue Estrace pv 2x/wk for now, may consider dc since it may predispose to stones.       Anemia Anemia, mild, Hgb 11.9 01/21/13, update date CBC and TSH      Family/ Staff Communication: observe the patient.   Goals of Care: SNF  Labs/tests ordered: CMP, CBC, TSH

## 2013-04-16 NOTE — Assessment & Plan Note (Signed)
Anemia, mild, Hgb 11.9 01/21/13, update date CBC and TSH

## 2013-04-16 NOTE — Assessment & Plan Note (Signed)
Risk reduction by considering dc Estradiol. Pain is managed with Fentanyl and Vicodin q6hr prn.

## 2013-04-16 NOTE — Assessment & Plan Note (Signed)
No bleeding presently.  Treated with Hydrocortisone rectal cream prn, TUCKs pad, Mesalamine suppository rectal daily

## 2013-04-16 NOTE — Assessment & Plan Note (Signed)
Continue with Prednisone 5mg by month.                

## 2013-04-16 NOTE — Assessment & Plan Note (Signed)
Continue Estrace pv 2x/wk for now, may consider dc since it may predispose to stones.

## 2013-04-16 NOTE — Assessment & Plan Note (Signed)
Currently taking Aricept, SNF for care     

## 2013-04-17 LAB — CBC AND DIFFERENTIAL
HCT: 36 % (ref 36–46)
Hemoglobin: 12 g/dL (ref 12.0–16.0)
WBC: 6.4 10^3/mL

## 2013-04-17 LAB — BASIC METABOLIC PANEL
Glucose: 68 mg/dL
Sodium: 130 mmol/L — AB (ref 137–147)

## 2013-04-17 LAB — HEPATIC FUNCTION PANEL: AST: 64 U/L — AB (ref 13–35)

## 2013-04-17 LAB — TSH: TSH: 1.32 u[IU]/mL (ref 0.41–5.90)

## 2013-04-23 ENCOUNTER — Non-Acute Institutional Stay (SKILLED_NURSING_FACILITY): Payer: Medicare Other | Admitting: Nurse Practitioner

## 2013-04-23 ENCOUNTER — Encounter: Payer: Self-pay | Admitting: Nurse Practitioner

## 2013-04-23 DIAGNOSIS — R7989 Other specified abnormal findings of blood chemistry: Secondary | ICD-10-CM

## 2013-04-23 DIAGNOSIS — R945 Abnormal results of liver function studies: Secondary | ICD-10-CM

## 2013-04-23 DIAGNOSIS — I5040 Unspecified combined systolic (congestive) and diastolic (congestive) heart failure: Secondary | ICD-10-CM

## 2013-04-23 DIAGNOSIS — M353 Polymyalgia rheumatica: Secondary | ICD-10-CM

## 2013-04-23 DIAGNOSIS — D649 Anemia, unspecified: Secondary | ICD-10-CM

## 2013-04-23 DIAGNOSIS — F0390 Unspecified dementia without behavioral disturbance: Secondary | ICD-10-CM

## 2013-04-23 DIAGNOSIS — F329 Major depressive disorder, single episode, unspecified: Secondary | ICD-10-CM | POA: Insufficient documentation

## 2013-04-23 DIAGNOSIS — K8010 Calculus of gallbladder with chronic cholecystitis without obstruction: Secondary | ICD-10-CM

## 2013-04-23 DIAGNOSIS — I10 Essential (primary) hypertension: Secondary | ICD-10-CM

## 2013-04-23 DIAGNOSIS — N952 Postmenopausal atrophic vaginitis: Secondary | ICD-10-CM

## 2013-04-23 NOTE — Assessment & Plan Note (Signed)
Anemia, mild, Hgb 11.9 01/21/13-12.0 04/17/13

## 2013-04-23 NOTE — Assessment & Plan Note (Signed)
Continue with Prednisone 5mg by month.                

## 2013-04-23 NOTE — Assessment & Plan Note (Signed)
Blood pressure controlled on Nadolol 40mg  except elevated SBP x1 189mmHg--will have Bp/P daily--evaluate later.

## 2013-04-23 NOTE — Assessment & Plan Note (Signed)
Persisted with elevated AST, ALT, alk phos   

## 2013-04-23 NOTE — Assessment & Plan Note (Signed)
Per clinical presentation and CXR--gentle diurese the patient, daily weight showed 113.8, 114, 117.6, 115.5, 119.9 along with persisted BLE--better with  Furosemide 20mg  and 40mg  alternating

## 2013-04-23 NOTE — Assessment & Plan Note (Signed)
Currently taking Aricept, SNF for care     

## 2013-04-23 NOTE — Assessment & Plan Note (Signed)
Pain is managed with Fentanyl 43mcg/hr and Norco q6hr prn

## 2013-04-23 NOTE — Assessment & Plan Note (Signed)
Sad facial looks, weight loss, poor appetite--may try Remeron 15mg  po daily if tolerated. My opinion her debility is the culprit.

## 2013-04-23 NOTE — Assessment & Plan Note (Signed)
Continue Estrace pv 2x/wk for now, may consider dc since it may predispose to stones.      

## 2013-04-23 NOTE — Progress Notes (Signed)
Patient ID: Tammy Myers, female   DOB: 1920-12-14, 79 y.o.   MRN: [id number] Code Status: DNR  Allergies  Allergen Reactions  . Caffeine   . Fosamax (Alendronate Sodium)   . Sudafed (Pseudoephedrine Hcl)     Chief Complaint  Patient presents with  . Medical Managment of Chronic Issues    weight loss, depressive mood, poor appetite.     HPI: Patient is a 79 y.o. female seen in the SNF at [facility] today for evaluation of weight loss, poor appetite, depressive mood, and other chronic medical condition.  Problem List Items Addressed This Visit   Abnormal LFTs - Primary     Persisted with elevated AST, ALT, alk phos    Anemia     Anemia, mild, Hgb 11.9 01/21/13-12.0 04/17/13        CHF (congestive heart failure)     Per clinical presentation and CXR--gentle diurese the patient, daily weight showed 113.8, 114, 117.6, 115.5, 119.9 along with persisted BLE--better with  Furosemide 20mg  and 40mg  alternating           Chronic cholecystitis with calculus     Pain is managed with Fentanyl 19mcg/hr and Norco q6hr prn    Dementia, in, senility     Currently taking Aricept, SNF for care          Depression     Sad facial looks, weight loss, poor appetite--may try Remeron 15mg  po daily if tolerated. My opinion her debility is the culprit.     HYPERTENSION     Blood pressure controlled on Nadolol 40mg  except elevated SBP x1 184mmHg--will have Bp/P daily--evaluate later.         Polymyalgia rheumatica     Continue with Prednisone 5mg  by month.                 Vaginitis, atrophic     Continue Estrace pv 2x/wk for now, may consider dc since it may predispose to stones.              Review of Systems:  Review of Systems  Constitutional: Positive for malaise/fatigue. Negative for fever, chills, weight loss and diaphoresis.  HENT: Positive for hearing loss. Negative for ear pain, nosebleeds, congestion, sore throat and neck pain.    Eyes: Negative for pain, discharge and redness.  Respiratory: Positive for cough (hacking) and shortness of breath. Negative for sputum production and wheezing.   Cardiovascular: Positive for leg swelling and PND. Negative for chest pain, palpitations, orthopnea and claudication.  Gastrointestinal: Positive for abdominal pain. Negative for heartburn, nausea, vomiting, diarrhea, constipation and blood in stool.  Genitourinary: Positive for frequency. Negative for dysuria, urgency, hematuria and flank pain.  Musculoskeletal: Positive for myalgias, back pain and joint pain.  Skin: Negative for itching and rash.  Neurological: Negative for dizziness, tingling, tremors, sensory change, speech change, focal weakness, seizures, loss of consciousness, weakness and headaches.  Endo/Heme/Allergies: Negative for environmental allergies and polydipsia. Bruises/bleeds easily.  Psychiatric/Behavioral: Positive for memory loss. Negative for depression and hallucinations. The patient is not nervous/anxious and does not have insomnia.      Past Medical History  Diagnosis Date  . PMR (polymyalgia rheumatica)   . Temporal arteritis   . Osteoporosis   . Hearing loss   . Wears glasses   . Rectal bleeding   . Arthritis   . Rectal prolapse   . Hypertension   . Mitral valve prolapse   . Spinal stenosis   . Alzheimer's dementia   .  Hyperlipidemia   . GERD (gastroesophageal reflux disease)   . Sciatica   . Scoliosis   . Esophageal varices   . Hiatal hernia   . Atrial fibrillation    Past Surgical History  Procedure Laterality Date  . Abdominal hysterectomy    . Appendectomy    . Colonoscopy    . Breast surgery      left  . Hernia repair      [facility]  . Esophagogastroduodenoscopy  11/02/2011    Procedure: ESOPHAGOGASTRODUODENOSCOPY (EGD);  Surgeon: Yancey Flemings, MD;  Location: [facility] ENDOSCOPY;  Service: Endoscopy;  Laterality: N/A;  . Rectal prolapse repair, rectopexy  11/16/2010    with sigmoid colectomy.   Repair w biologic mesh in pelvis  . Cholecystectomy  12/12/11  . Esophagogastroduodenoscopy N/A 01/10/2013    Procedure: ESOPHAGOGASTRODUODENOSCOPY (EGD);  Surgeon: Meryl Dare, MD;  Location: [facility] ENDOSCOPY;  Service: Endoscopy;  Laterality: N/A;  . Esophagogastroduodenoscopy N/A 02/12/2013    Procedure: ESOPHAGOGASTRODUODENOSCOPY (EGD);  Surgeon: Hilarie Fredrickson, MD;  Location: [facility] ENDOSCOPY;  Service: Endoscopy;  Laterality: N/A;   Social History:   reports that she has never smoked. She has never used smokeless tobacco. She reports that she does not drink alcohol or use illicit drugs.  Family History  Problem Relation Age of Onset  . Bladder Cancer Brother     Medications: Patient's Medications  New Prescriptions   No medications on file  Previous Medications   AMBULATORY NON FORMULARY MEDICATION    O2 @@ 2 LPM   CALCIUM-VITAMIN D (OSCAL WITH D) 500-200 MG-UNIT PER TABLET    Take 1 tablet by mouth 2 (two) times daily.   DONEPEZIL (ARICEPT) 10 MG TABLET    Take 10 mg by mouth at bedtime.   ESTRADIOL (ESTRACE) 0.1 MG/GM VAGINAL CREAM    Place 2 g vaginally once a week. Applied on Thursday nights.   FENTANYL (DURAGESIC - DOSED MCG/HR) 25 MCG/HR    Place 1 patch (25 mcg total) onto the skin every 3 (three) days.   FUROSEMIDE (LASIX) 20 MG TABLET    Take 20 mg by mouth daily.   HYDROCODONE-ACETAMINOPHEN (VICODIN) 5-500 MG PER TABLET    Take 1 tablet by mouth every 6 (six) hours as needed for pain.   HYDROCORTISONE (ANUSOL-HC) 2.5 % RECTAL CREAM    Place rectally 3 (three) times daily.   MESALAMINE (CANASA) 1000 MG SUPPOSITORY    Place 1 suppository (1,000 mg total) rectally daily.   MOXIFLOXACIN (AVELOX) 400 MG TABLET    Take 400 mg by mouth daily.   NADOLOL (CORGARD) 40 MG TABLET    Take 1 tablet (40 mg total) by mouth daily.   PANTOPRAZOLE (PROTONIX) 40 MG TABLET    Take 40 mg by mouth 2 (two) times daily.   POTASSIUM CHLORIDE (K-DUR,KLOR-CON) 10 MEQ TABLET    Take 10 mEq by mouth daily.    PREDNISONE (DELTASONE) 5 MG TABLET    Take 5 mg by mouth daily.     SACCHAROMYCES BOULARDII (FLORASTOR) 250 MG CAPSULE    Take 250 mg by mouth 2 (two) times daily.   WITCH HAZEL-GLYCERIN (TUCKS) PAD    Place 1 application rectally as needed for pain.  Modified Medications   No medications on file  Discontinued Medications   No medications on file     Physical Exam: Physical Exam  Constitutional: She is oriented to person, place, and time. She appears well-developed and well-nourished. No distress.  HENT:  Head: Normocephalic and atraumatic.  Right Ear: External ear normal.  Left Ear: External ear normal.  Nose: Nose normal.  Mouth/Throat: Oropharynx is clear and moist. No oropharyngeal exudate.  Eyes: Conjunctivae and EOM are normal. Pupils are equal, round, and reactive to light. Right eye exhibits no discharge. Left eye exhibits no discharge. No scleral icterus.  Neck: Normal range of motion. Neck supple. No JVD present. No thyromegaly present.  Cardiovascular: Normal rate, regular rhythm and intact distal pulses.   Murmur heard.  Systolic murmur is present with a grade of 3/6  Pulmonary/Chest: Tachypnea noted. No respiratory distress. She has no decreased breath sounds. She has no wheezes. She has rhonchi (corse breath sound diffused). She has rales in the right lower field and the left lower field. She exhibits no tenderness.  Abdominal: Soft. Bowel sounds are normal. She exhibits no distension and no mass. There is no tenderness. There is no rebound and no guarding.  Genitourinary: Vagina normal. No vaginal discharge found.  Musculoskeletal: Normal range of motion. She exhibits no tenderness. Edema: ankles-improved.  kyphosis  Lymphadenopathy:    She has no cervical adenopathy.  Neurological: She is alert and oriented to person, place, and time. She has normal reflexes. She displays normal reflexes. No cranial nerve deficit. She exhibits normal muscle tone. Coordination normal.   Skin: Skin is warm and dry. No rash noted. She is not diaphoretic. No erythema.  Bilateral lower leg abrasions  Psychiatric: Judgment normal. Her mood appears not anxious. Her affect is not angry, not blunt, not labile and not inappropriate. Her speech is not rapid and/or pressured, not delayed, not tangential and not slurred. She is not agitated, not aggressive, not hyperactive, not slowed, not withdrawn, not actively hallucinating and not combative. Thought content is not paranoid and not delusional. Cognition and memory are impaired. She does not express impulsivity or inappropriate judgment. She does not exhibit a depressed mood. She exhibits abnormal recent memory. She exhibits normal remote memory.    Filed Vitals:   04/23/13 1441  BP: 160/82  Pulse: 74  Temp: 97 F (36.1 C)  TempSrc: Tympanic  Resp: 24      Labs reviewed: Basic Metabolic Panel:  Recent Labs  16/10/96 2023 01/12/13 0930 01/14/13 0500 01/30/13 02/13/13 04/17/13  NA 140 139 137 140 139 130*  K 5.1 3.5 3.5 4.6 4.0 4.2  CL 108 107 106  --   --   --   CO2 25 28 27   --   --   --   GLUCOSE 133* 104* 110*  --   --   --   BUN 46* 27* 23 25* 17 68*  CREATININE 0.84 0.58 0.53 0.6 0.5 0.5  CALCIUM 8.8 7.6* 7.7*  --   --   --   TSH  --   --   --   --   --  1.32   Liver Function Tests:  Recent Labs  01/09/13 2023 01/14/13 0500 01/21/13 04/17/13  AST 36 65* 54* 64*  ALT 28 53* 47* 48*  ALKPHOS 241* 348* 460* 470*  BILITOT 0.9 1.7*  --   --   PROT 5.0* 4.5*  --   --   ALBUMIN 2.5* 2.3*  --   --     Recent Labs  01/09/13 2023  LIPASE 43    CBC:  Recent Labs  07/11/12 0934 01/09/13 2023  01/14/13 0500 01/21/13 04/17/13  WBC 5.4 7.3  --  7.2 7.0 6.4  NEUTROABS  --  5.7  --   --   --   --  HGB 13.2 6.8*  < > 11.4* 11.9* 12.0  HCT 39.1 20.0*  < > 34.3* 36 36  MCV 102.1* 94.3  --  93.7  --   --   PLT 129* 143*  --  84* 121* 153  < > = values in this interval not displayed.   Past  Procedures:     Assessment/Plan  Problem List Items Addressed This Visit   Abnormal LFTs - Primary     Persisted with elevated AST, ALT, alk phos    Anemia     Anemia, mild, Hgb 11.9 01/21/13-12.0 04/17/13        CHF (congestive heart failure)     Per clinical presentation and CXR--gentle diurese the patient, daily weight showed 113.8, 114, 117.6, 115.5, 119.9 along with persisted BLE--better with  Furosemide 20mg  and 40mg  alternating           Chronic cholecystitis with calculus     Pain is managed with Fentanyl 59mcg/hr and Norco q6hr prn    Dementia, in, senility     Currently taking Aricept, SNF for care          Depression     Sad facial looks, weight loss, poor appetite--may try Remeron 15mg  po daily if tolerated. My opinion her debility is the culprit.     HYPERTENSION     Blood pressure controlled on Nadolol 40mg  except elevated SBP x1 158mmHg--will have Bp/P daily--evaluate later.         Polymyalgia rheumatica     Continue with Prednisone 5mg  by month.                 Vaginitis, atrophic     Continue Estrace pv 2x/wk for now, may consider dc since it may predispose to stones.              Family/ Staff Communication: observe the patient.   Goals of Care: SNF  Labs/tests ordered: none     \\

## 2013-04-25 ENCOUNTER — Encounter: Payer: Self-pay | Admitting: *Deleted

## 2013-05-12 ENCOUNTER — Non-Acute Institutional Stay (SKILLED_NURSING_FACILITY): Payer: Medicare Other | Admitting: Nurse Practitioner

## 2013-05-12 DIAGNOSIS — I8501 Esophageal varices with bleeding: Secondary | ICD-10-CM

## 2013-05-12 DIAGNOSIS — K449 Diaphragmatic hernia without obstruction or gangrene: Secondary | ICD-10-CM

## 2013-05-12 DIAGNOSIS — F3289 Other specified depressive episodes: Secondary | ICD-10-CM

## 2013-05-12 DIAGNOSIS — M79609 Pain in unspecified limb: Secondary | ICD-10-CM

## 2013-05-12 DIAGNOSIS — M353 Polymyalgia rheumatica: Secondary | ICD-10-CM

## 2013-05-12 DIAGNOSIS — R188 Other ascites: Secondary | ICD-10-CM

## 2013-05-12 DIAGNOSIS — F0390 Unspecified dementia without behavioral disturbance: Secondary | ICD-10-CM

## 2013-05-12 DIAGNOSIS — F329 Major depressive disorder, single episode, unspecified: Secondary | ICD-10-CM

## 2013-05-12 DIAGNOSIS — R109 Unspecified abdominal pain: Secondary | ICD-10-CM

## 2013-05-12 NOTE — Progress Notes (Signed)
Patient ID: Tammy Myers, female   DOB: [birth year], 79 y.o.   MRN: [id number] Code Status: DNR  Allergies  Allergen Reactions  . Caffeine   . Fosamax (Alendronate Sodium)   . Sudafed (Pseudoephedrine Hcl)     Chief Complaint  Patient presents with  . Medical Managment of Chronic Issues    weight loss, ulnar aspect of the right 5th finger/hand warmth/tenderness/swelling    HPI: Patient is a 79 y.o. female seen in the SNF at [facility] today for evaluation of the right hand pain and other chronic medical conditions.  Problem List Items Addressed This Visit   Abdominal pain     Risk reduction by considering dc Estradiol. Pain is managed with Fentanyl and Vicodin q6hr prn.           Ascites (Chronic)     02/06/13 [place] abd showed free intraperitoneal fluid/ascites is seen in all four quadrants of the abdomen to a moderate to large extent, bilateral pleural effusion are noted as well. Furosemide 20mg  and 40mg  alternating dose since 02/05/13. Bun/creat 17/0.50 02/13/13. Update CMP. Weight loss persisted. Elevated LFT        Dementia, in, senility     Currently taking Aricept, SNF for care            Depression     Sad facial looks, weight loss, poor appetite--no better sinceRemeron 15mg  po daily. My opinion her debility is the culprit.       Esophageal varices with hemorrhage     F/u GI, takes Nadolol, s/p bands    RESOLVED: Hiatal hernia     Takes PPI daily.     Pain in hand - Primary     Ulnar aspect of the right hand mainly 5th finger-uric acid, CBC, BMP unremarkable. ? Trauma--observe.     Polymyalgia rheumatica     Continue with Prednisone 5mg  by month.                      Review of Systems:  Review of Systems  Constitutional: Positive for weight loss and malaise/fatigue. Negative for fever, chills and diaphoresis.  HENT: Positive for hearing loss. Negative for ear pain, nosebleeds, congestion, sore throat and neck pain.    Eyes: Negative for pain, discharge and redness.  Respiratory: Positive for cough (hacking) and shortness of breath. Negative for sputum production and wheezing.   Cardiovascular: Positive for leg swelling and PND. Negative for chest pain, palpitations, orthopnea and claudication.  Gastrointestinal: Positive for abdominal pain. Negative for heartburn, nausea, vomiting, diarrhea, constipation and blood in stool.  Genitourinary: Positive for frequency. Negative for dysuria, urgency, hematuria and flank pain.  Musculoskeletal: Positive for myalgias, back pain and joint pain.  Skin: Negative for itching and rash.       The ulna aspect of the right hand swelling, pain, warmth  Neurological: Negative for dizziness, tingling, tremors, sensory change, speech change, focal weakness, seizures, loss of consciousness, weakness and headaches.  Endo/Heme/Allergies: Negative for environmental allergies and polydipsia. Bruises/bleeds easily.  Psychiatric/Behavioral: Positive for memory loss. Negative for depression and hallucinations. The patient is not nervous/anxious and does not have insomnia.      Past Medical History  Diagnosis Date  . PMR (polymyalgia rheumatica)   . Temporal arteritis   . Osteoporosis   . Hearing loss   . Wears glasses   . Rectal bleeding   . Arthritis   . Rectal prolapse   . Hypertension   . Mitral valve  prolapse   . Spinal stenosis   . Alzheimer's dementia   . Hyperlipidemia   . GERD (gastroesophageal reflux disease)   . Sciatica   . Scoliosis   . Esophageal varices   . Hiatal hernia   . Atrial fibrillation   . Unspecified constipation   . Other specified circulatory system disorders   . Internal hemorrhoids without mention of complication   . External hemorrhoids without mention of complication   . Sciatica   . Pain in joint, ankle and foot   . Abdominal pain, unspecified site    Past Surgical History  Procedure Laterality Date  . Abdominal hysterectomy    .  Appendectomy    . Colonoscopy    . Breast surgery      left  . Hernia repair      LIH  . Esophagogastroduodenoscopy  11/02/2011    Procedure: ESOPHAGOGASTRODUODENOSCOPY (EGD);  Surgeon: Yancey Flemings, MD;  Location: [facility] ENDOSCOPY;  Service: Endoscopy;  Laterality: N/A;  . Rectal prolapse repair, rectopexy  11/16/2010    with sigmoid colectomy.  Repair w biologic mesh in pelvis  . Cholecystectomy  12/12/11  . Esophagogastroduodenoscopy N/A 01/10/2013    Procedure: ESOPHAGOGASTRODUODENOSCOPY (EGD);  Surgeon: Meryl Dare, MD;  Location: [facility] ENDOSCOPY;  Service: Endoscopy;  Laterality: N/A;  . Esophagogastroduodenoscopy N/A 02/12/2013    Procedure: ESOPHAGOGASTRODUODENOSCOPY (EGD);  Surgeon: Hilarie Fredrickson, MD;  Location: [facility] ENDOSCOPY;  Service: Endoscopy;  Laterality: N/A;   Social History:   reports that she has never smoked. She has never used smokeless tobacco. She reports that she does not drink alcohol or use illicit drugs.  Family History  Problem Relation Age of Onset  . Bladder Cancer Brother     Medications: Reviewed at [facility]   Physical Exam: Physical Exam  Constitutional: She is oriented to person, place, and time. She appears well-developed and well-nourished. No distress.  HENT:  Head: Normocephalic and atraumatic.  Right Ear: External ear normal.  Left Ear: External ear normal.  Nose: Nose normal.  Mouth/Throat: Oropharynx is clear and moist. No oropharyngeal exudate.  Eyes: Conjunctivae and EOM are normal. Pupils are equal, round, and reactive to light. Right eye exhibits no discharge. Left eye exhibits no discharge. No scleral icterus.  Neck: Normal range of motion. Neck supple. No JVD present. No thyromegaly present.  Cardiovascular: Normal rate, regular rhythm and intact distal pulses.   Murmur heard.  Systolic murmur is present with a grade of 3/6  Pulmonary/Chest: Tachypnea noted. No respiratory distress. She has no decreased breath sounds. She has no wheezes. She has  rhonchi (corse breath sound diffused). She has rales in the right lower field and the left lower field. She exhibits no tenderness.  Abdominal: Soft. Bowel sounds are normal. She exhibits no distension and no mass. There is no tenderness. There is no rebound and no guarding.  Genitourinary: Vagina normal. No vaginal discharge found.  Musculoskeletal: Normal range of motion. She exhibits no tenderness. Edema: ankles-improved.  kyphosis  Lymphadenopathy:    She has no cervical adenopathy.  Neurological: She is alert and oriented to person, place, and time. She has normal reflexes. She displays normal reflexes. No cranial nerve deficit. She exhibits normal muscle tone. Coordination normal.  Skin: Skin is warm and dry. No rash noted. She is not diaphoretic. No erythema.  Bilateral lower leg abrasions-healed. The ulna aspect of the right hand warmth/tenderness/swelling.   Psychiatric: Judgment normal. Her mood appears not anxious. Her affect is not angry, not blunt, not  labile and not inappropriate. Her speech is not rapid and/or pressured, not delayed, not tangential and not slurred. She is not agitated, not aggressive, not hyperactive, not slowed, not withdrawn, not actively hallucinating and not combative. Thought content is not paranoid and not delusional. Cognition and memory are impaired. She does not express impulsivity or inappropriate judgment. She does not exhibit a depressed mood. She exhibits abnormal recent memory. She exhibits normal remote memory.    Filed Vitals:   05/15/13 1627  BP: 128/80  Pulse: 74  Temp: 97.4 F (36.3 C)  TempSrc: Tympanic  Resp: 18      Labs reviewed: Basic Metabolic Panel:  Recent Labs  40/98/11 2023 01/12/13 0930 01/14/13 0500  02/13/13 04/17/13 05/13/13  NA 140 139 137  < > 139 130* 140  K 5.1 3.5 3.5  < > 4.0 4.2 4.2  CL 108 107 106  --   --   --   --   CO2 25 28 27   --   --   --   --   GLUCOSE 133* 104* 110*  --   --   --   --   BUN 46* 27*  23  < > 17 68* 24*  CREATININE 0.84 0.58 0.53  < > 0.5 0.5 0.5  CALCIUM 8.8 7.6* 7.7*  --   --   --   --   TSH  --   --   --   --   --  1.32  --   < > = values in this interval not displayed. Liver Function Tests:  Recent Labs  01/09/13 2023 01/14/13 0500 01/21/13 04/17/13 05/13/13  AST 36 65* 54* 64* 71*  ALT 28 53* 47* 48* 52*  ALKPHOS 241* 348* 460* 470* 463*  BILITOT 0.9 1.7*  --   --   --   PROT 5.0* 4.5*  --   --   --   ALBUMIN 2.5* 2.3*  --   --   --     Recent Labs  01/09/13 2023  LIPASE 43    CBC:  Recent Labs  07/11/12 0934 01/09/13 2023  01/14/13 0500 01/21/13 04/17/13 05/13/13  WBC 5.4 7.3  --  7.2 7.0 6.4 6.3  NEUTROABS  --  5.7  --   --   --   --   --   HGB 13.2 6.8*  < > 11.4* 11.9* 12.0 12.1  HCT 39.1 20.0*  < > 34.3* 36 36 37  MCV 102.1* 94.3  --  93.7  --   --   --   PLT 129* 143*  --  84* 121* 153 167  < > = values in this interval not displayed.      Assessment/Plan Pain in hand Ulnar aspect of the right hand mainly 5th finger-uric acid, CBC, BMP unremarkable. ? Trauma--observe.   Ascites 02/06/13 [place] abd showed free intraperitoneal fluid/ascites is seen in all four quadrants of the abdomen to a moderate to large extent, bilateral pleural effusion are noted as well. Furosemide 20mg  and 40mg  alternating dose since 02/05/13. Bun/creat 17/0.50 02/13/13. Update CMP. Weight loss persisted. Elevated LFT      Abdominal pain Risk reduction by considering dc Estradiol. Pain is managed with Fentanyl and Vicodin q6hr prn.         Depression Sad facial looks, weight loss, poor appetite--no better sinceRemeron 15mg  po daily. My opinion her debility is the culprit.     Esophageal varices with hemorrhage F/u GI, takes  Nadolol, s/p bands  Dementia, in, senility Currently taking Aricept, SNF for care          Polymyalgia rheumatica Continue with Prednisone 5mg  by month.                 Hiatal hernia Takes PPI  daily.     Family/ Staff Communication: observe the patient.   Goals of Care: SNF  Labs/tests ordered:  none

## 2013-05-13 LAB — BASIC METABOLIC PANEL
Creatinine: 0.5 mg/dL (ref 0.5–1.1)
Potassium: 4.2 mmol/L (ref 3.4–5.3)
Sodium: 140 mmol/L (ref 137–147)

## 2013-05-13 LAB — CBC AND DIFFERENTIAL
Hemoglobin: 12.1 g/dL (ref 12.0–16.0)
Platelets: 167 10*3/uL (ref 150–399)

## 2013-05-13 LAB — HEPATIC FUNCTION PANEL
ALT: 52 U/L — AB (ref 7–35)
ALT: 52 U/L — AB (ref 7–35)
AST: 71 U/L — AB (ref 13–35)
Alkaline Phosphatase: 463 U/L — AB (ref 25–125)
Alkaline Phosphatase: 463 U/L — AB (ref 25–125)

## 2013-05-15 ENCOUNTER — Encounter: Payer: Self-pay | Admitting: Nurse Practitioner

## 2013-05-15 DIAGNOSIS — M79643 Pain in unspecified hand: Secondary | ICD-10-CM | POA: Insufficient documentation

## 2013-05-15 NOTE — Assessment & Plan Note (Signed)
F/u GI, takes Nadolol, s/p bands         

## 2013-05-15 NOTE — Assessment & Plan Note (Signed)
Ulnar aspect of the right hand mainly 5th finger-uric acid, CBC, BMP unremarkable. ? Trauma--observe.

## 2013-05-15 NOTE — Assessment & Plan Note (Signed)
Risk reduction by considering dc Estradiol. Pain is managed with Fentanyl 25mcg and Vicodin q6hr prn.      

## 2013-05-15 NOTE — Assessment & Plan Note (Signed)
Currently taking Aricept, SNF for care

## 2013-05-15 NOTE — Assessment & Plan Note (Signed)
Continue with Prednisone 5mg  by month.

## 2013-05-15 NOTE — Assessment & Plan Note (Signed)
Sad facial looks, weight loss, poor appetite--no better sinceRemeron 15mg po daily. My opinion her debility is the culprit.        

## 2013-05-15 NOTE — Assessment & Plan Note (Signed)
Takes PPI daily.

## 2013-05-15 NOTE — Assessment & Plan Note (Signed)
02/06/13 [place] abd showed free intraperitoneal fluid/ascites is seen in all four quadrants of the abdomen to a moderate to large extent, bilateral pleural effusion are noted as well. Furosemide 20mg  and 40mg  alternating dose since 02/05/13. Bun/creat 17/0.50 02/13/13. Update CMP. Weight loss persisted. Elevated LFT

## 2013-05-26 ENCOUNTER — Encounter: Payer: Self-pay | Admitting: Nurse Practitioner

## 2013-05-26 ENCOUNTER — Non-Acute Institutional Stay (SKILLED_NURSING_FACILITY): Payer: Medicare Other | Admitting: Nurse Practitioner

## 2013-05-26 DIAGNOSIS — F0390 Unspecified dementia without behavioral disturbance: Secondary | ICD-10-CM

## 2013-05-26 DIAGNOSIS — F329 Major depressive disorder, single episode, unspecified: Secondary | ICD-10-CM

## 2013-05-26 DIAGNOSIS — I509 Heart failure, unspecified: Secondary | ICD-10-CM

## 2013-05-26 DIAGNOSIS — K2210 Ulcer of esophagus without bleeding: Secondary | ICD-10-CM

## 2013-05-26 DIAGNOSIS — I8501 Esophageal varices with bleeding: Secondary | ICD-10-CM

## 2013-05-26 DIAGNOSIS — K8010 Calculus of gallbladder with chronic cholecystitis without obstruction: Secondary | ICD-10-CM

## 2013-05-26 NOTE — Assessment & Plan Note (Signed)
F/u GI, takes Nadolol, s/p bands

## 2013-05-26 NOTE — Assessment & Plan Note (Signed)
Risk reduction by considering dc Estradiol. Pain is managed with Fentanyl 25mcg and Vicodin q6hr prn.      

## 2013-05-26 NOTE — Assessment & Plan Note (Signed)
Sad facial looks, weight loss, poor appetite--no better sinceRemeron 15mg  po daily. My opinion her debility is the culprit.

## 2013-05-26 NOTE — Assessment & Plan Note (Signed)
02/06/13 [place] abd showed free intraperitoneal fluid/ascites is seen in all four quadrants of the abdomen to a moderate to large extent, bilateral pleural effusion are noted as well. Furosemide 20mg and 40mg alternating dose since 02/05/13. Bun/creat 17/0.50 02/13/13. Update CMP. Weight loss persisted. Elevated LFT     

## 2013-05-26 NOTE — Assessment & Plan Note (Signed)
Currently taking Aricept, SNF for care     

## 2013-05-26 NOTE — Progress Notes (Signed)
Patient ID: Tammy Myers, female   DOB: Dec 01, 1920, 79 y.o.   MRN: [id number] Code Status: DNR  Allergies  Allergen Reactions  . Caffeine   . Fosamax [Alendronate Sodium]   . Sudafed [Pseudoephedrine Hcl]     Chief Complaint  Patient presents with  . Medical Managment of Chronic Issues    HPI: Patient is a 79 y.o. female seen in the SNF at [facility] today for evaluation of  chronic medical conditions.  Problem List Items Addressed This Visit   CHF (congestive heart failure) - Primary     02/06/13 [place] abd showed free intraperitoneal fluid/ascites is seen in all four quadrants of the abdomen to a moderate to large extent, bilateral pleural effusion are noted as well. Furosemide 20mg  and 40mg  alternating dose since 02/05/13. Bun/creat 17/0.50 02/13/13. Update CMP. Weight loss persisted. Elevated LFT          Chronic cholecystitis with calculus     Risk reduction by considering dc Estradiol. Pain is managed with Fentanyl and Vicodin q6hr prn.             Dementia, in, senility     Currently taking Aricept, SNF for care              Depression     Sad facial looks, weight loss, poor appetite--no better sinceRemeron 15mg  po daily. My opinion her debility is the culprit.         Esophageal varices with hemorrhage     F/u GI, takes Nadolol, s/p bands      Esophagitis, erosive     Stable on Protonix 40mg  bid.        Review of Systems:  Review of Systems  Constitutional: Positive for weight loss and malaise/fatigue. Negative for fever, chills and diaphoresis.  HENT: Positive for hearing loss. Negative for ear pain, nosebleeds, congestion, sore throat and neck pain.   Eyes: Negative for pain, discharge and redness.  Respiratory: Positive for cough (hacking) and shortness of breath. Negative for sputum production and wheezing.   Cardiovascular: Positive for leg swelling and PND. Negative for chest pain, palpitations, orthopnea and  claudication.  Gastrointestinal: Positive for abdominal pain. Negative for heartburn, nausea, vomiting, diarrhea, constipation and blood in stool.  Genitourinary: Positive for frequency. Negative for dysuria, urgency, hematuria and flank pain.  Musculoskeletal: Positive for myalgias, back pain and joint pain.  Skin: Negative for itching and rash.       The ulna aspect of the right hand swelling, pain, warmth  Neurological: Negative for dizziness, tingling, tremors, sensory change, speech change, focal weakness, seizures, loss of consciousness, weakness and headaches.  Endo/Heme/Allergies: Negative for environmental allergies and polydipsia. Bruises/bleeds easily.  Psychiatric/Behavioral: Positive for memory loss. Negative for depression and hallucinations. The patient is not nervous/anxious and does not have insomnia.      Past Medical History  Diagnosis Date  . PMR (polymyalgia rheumatica)   . Temporal arteritis   . Osteoporosis   . Hearing loss   . Wears glasses   . Rectal bleeding   . Arthritis   . Rectal prolapse   . Hypertension   . Mitral valve prolapse   . Spinal stenosis   . Alzheimer's dementia   . Hyperlipidemia   . GERD (gastroesophageal reflux disease)   . Sciatica   . Scoliosis   . Esophageal varices   . Hiatal hernia   . Atrial fibrillation   . Unspecified constipation   . Other specified circulatory system disorders   .  Internal hemorrhoids without mention of complication   . External hemorrhoids without mention of complication   . Sciatica   . Pain in joint, ankle and foot   . Abdominal pain, unspecified site    Past Surgical History  Procedure Laterality Date  . Abdominal hysterectomy    . Appendectomy    . Colonoscopy    . Breast surgery      left  . Hernia repair      LIH  . Esophagogastroduodenoscopy  11/02/2011    Procedure: ESOPHAGOGASTRODUODENOSCOPY (EGD);  Surgeon: Yancey Flemings, MD;  Location: [facility] ENDOSCOPY;  Service: Endoscopy;  Laterality: N/A;   . Rectal prolapse repair, rectopexy  11/16/2010    with sigmoid colectomy.  Repair w biologic mesh in pelvis  . Cholecystectomy  12/12/11  . Esophagogastroduodenoscopy N/A 01/10/2013    Procedure: ESOPHAGOGASTRODUODENOSCOPY (EGD);  Surgeon: Meryl Dare, MD;  Location: [facility] ENDOSCOPY;  Service: Endoscopy;  Laterality: N/A;  . Esophagogastroduodenoscopy N/A 02/12/2013    Procedure: ESOPHAGOGASTRODUODENOSCOPY (EGD);  Surgeon: Hilarie Fredrickson, MD;  Location: [facility] ENDOSCOPY;  Service: Endoscopy;  Laterality: N/A;   Social History:   reports that she has never smoked. She has never used smokeless tobacco. She reports that she does not drink alcohol or use illicit drugs.  Family History  Problem Relation Age of Onset  . Bladder Cancer Brother     Medications: Reviewed at [facility]   Physical Exam: Physical Exam  Constitutional: She is oriented to person, place, and time. She appears well-developed and well-nourished. No distress.  HENT:  Head: Normocephalic and atraumatic.  Right Ear: External ear normal.  Left Ear: External ear normal.  Nose: Nose normal.  Mouth/Throat: Oropharynx is clear and moist. No oropharyngeal exudate.  Eyes: Conjunctivae and EOM are normal. Pupils are equal, round, and reactive to light. Right eye exhibits no discharge. Left eye exhibits no discharge. No scleral icterus.  Neck: Normal range of motion. Neck supple. No JVD present. No thyromegaly present.  Cardiovascular: Normal rate, regular rhythm and intact distal pulses.   Murmur heard.  Systolic murmur is present with a grade of 3/6  Pulmonary/Chest: Tachypnea noted. No respiratory distress. She has no decreased breath sounds. She has no wheezes. She has rhonchi (corse breath sound diffused). She has rales in the right lower field and the left lower field. She exhibits no tenderness.  Abdominal: Soft. Bowel sounds are normal. She exhibits no distension and no mass. There is no tenderness. There is no rebound and no guarding.   Genitourinary: Vagina normal. No vaginal discharge found.  Musculoskeletal: Normal range of motion. She exhibits no tenderness. Edema: ankles-improved.  kyphosis  Lymphadenopathy:    She has no cervical adenopathy.  Neurological: She is alert and oriented to person, place, and time. She has normal reflexes. She displays normal reflexes. No cranial nerve deficit. She exhibits normal muscle tone. Coordination normal.  Skin: Skin is warm and dry. No rash noted. She is not diaphoretic. No erythema.  Bilateral lower leg abrasions-healed. The ulna aspect of the right hand warmth/tenderness/swelling.   Psychiatric: Judgment normal. Her mood appears not anxious. Her affect is not angry, not blunt, not labile and not inappropriate. Her speech is not rapid and/or pressured, not delayed, not tangential and not slurred. She is not agitated, not aggressive, not hyperactive, not slowed, not withdrawn, not actively hallucinating and not combative. Thought content is not paranoid and not delusional. Cognition and memory are impaired. She does not express impulsivity or inappropriate judgment. She does not exhibit  a depressed mood. She exhibits abnormal recent memory. She exhibits normal remote memory.    Filed Vitals:   05/26/13 1713  BP: 118/52  Pulse: 70  Temp: 98.2 F (36.8 C)  TempSrc: Tympanic  Resp: 24      Labs reviewed: Basic Metabolic Panel:  Recent Labs  16/10/96 2023 01/12/13 0930 01/14/13 0500  02/13/13 04/17/13 05/13/13  NA 140 139 137  < > 139 130* 140  K 5.1 3.5 3.5  < > 4.0 4.2 4.2  CL 108 107 106  --   --   --   --   CO2 25 28 27   --   --   --   --   GLUCOSE 133* 104* 110*  --   --   --   --   BUN 46* 27* 23  < > 17 68* 24*  CREATININE 0.84 0.58 0.53  < > 0.5 0.5 0.5  CALCIUM 8.8 7.6* 7.7*  --   --   --   --   TSH  --   --   --   --   --  1.32  --   < > = values in this interval not displayed. Liver Function Tests:  Recent Labs  01/09/13 2023 01/14/13 0500 01/21/13  04/17/13 05/13/13  AST 36 65* 54* 64* 71*  ALT 28 53* 47* 48* 52*  ALKPHOS 241* 348* 460* 470* 463*  BILITOT 0.9 1.7*  --   --   --   PROT 5.0* 4.5*  --   --   --   ALBUMIN 2.5* 2.3*  --   --   --     Recent Labs  01/09/13 2023  LIPASE 43   CBC:  Recent Labs  07/11/12 0934 01/09/13 2023  01/14/13 0500 01/21/13 04/17/13 05/13/13  WBC 5.4 7.3  --  7.2 7.0 6.4 6.3  NEUTROABS  --  5.7  --   --   --   --   --   HGB 13.2 6.8*  < > 11.4* 11.9* 12.0 12.1  HCT 39.1 20.0*  < > 34.3* 36 36 37  MCV 102.1* 94.3  --  93.7  --   --   --   PLT 129* 143*  --  84* 121* 153 167  < > = values in this interval not displayed.  Past Procedures:  02/06/13 [place] abd: free intraperitoneal fluid/ascites is seen in all four quadrants of the abdomen to a moderate to large extent.    Assessment/Plan CHF (congestive heart failure) 02/06/13 [place] abd showed free intraperitoneal fluid/ascites is seen in all four quadrants of the abdomen to a moderate to large extent, bilateral pleural effusion are noted as well. Furosemide 20mg  and 40mg  alternating dose since 02/05/13. Bun/creat 17/0.50 02/13/13. Update CMP. Weight loss persisted. Elevated LFT        Chronic cholecystitis with calculus Risk reduction by considering dc Estradiol. Pain is managed with Fentanyl and Vicodin q6hr prn.           Depression Sad facial looks, weight loss, poor appetite--no better sinceRemeron 15mg  po daily. My opinion her debility is the culprit.       Dementia, in, senility Currently taking Aricept, SNF for care            Esophageal varices with hemorrhage F/u GI, takes Nadolol, s/p bands    Esophagitis, erosive Stable on Protonix 40mg  bid.     Family/ Staff Communication: observe the patient  Goals of Care: SNF  Labs/tests ordered: none

## 2013-05-26 NOTE — Assessment & Plan Note (Signed)
Stable on Protonix 40mg bid.   

## 2013-06-19 ENCOUNTER — Encounter: Payer: Self-pay | Admitting: Nurse Practitioner

## 2013-06-19 ENCOUNTER — Non-Acute Institutional Stay (SKILLED_NURSING_FACILITY): Payer: Medicare Other | Admitting: Nurse Practitioner

## 2013-06-19 DIAGNOSIS — F0390 Unspecified dementia without behavioral disturbance: Secondary | ICD-10-CM

## 2013-06-19 DIAGNOSIS — I509 Heart failure, unspecified: Secondary | ICD-10-CM

## 2013-06-19 DIAGNOSIS — K2210 Ulcer of esophagus without bleeding: Secondary | ICD-10-CM

## 2013-06-19 DIAGNOSIS — I8501 Esophageal varices with bleeding: Secondary | ICD-10-CM

## 2013-06-19 DIAGNOSIS — M353 Polymyalgia rheumatica: Secondary | ICD-10-CM

## 2013-06-19 DIAGNOSIS — R945 Abnormal results of liver function studies: Secondary | ICD-10-CM

## 2013-06-19 DIAGNOSIS — R7989 Other specified abnormal findings of blood chemistry: Secondary | ICD-10-CM

## 2013-06-19 NOTE — Progress Notes (Signed)
Patient ID: Tammy Myers, female   DOB: Sep 30, 1921, 79 y.o.   MRN: [id number] Code Status: DNR  Allergies  Allergen Reactions  . Caffeine   . Fosamax [Alendronate Sodium]   . Sudafed [Pseudoephedrine Hcl]     Chief Complaint  Patient presents with  . Medical Managment of Chronic Issues    HPI: Patient is a 79 y.o. female seen in the SNF at [facility] today for evaluation of  chronic medical conditions.  Problem List Items Addressed This Visit   Abnormal LFTs     Persisted with elevated AST, ALT, alk phos      CHF (congestive heart failure)     02/06/13 [place] abd showed free intraperitoneal fluid/ascites is seen in all four quadrants of the abdomen to a moderate to large extent, bilateral pleural effusion are noted as well. Furosemide 20mg  and 40mg  alternating dose since 02/05/13. Bun/creat 17/0.50 02/13/13 and 24/0.52 05/13/13 Weight loss persisted. Elevated LFT persisted AST. ALT, alk phos.             Dementia, in, senility     Currently taking Aricept, SNF for care                Esophageal varices with hemorrhage     F/u GI, takes Nadolol, s/p bands      Esophagitis, erosive     Stable on Protonix 40mg  bid.       Polymyalgia rheumatica - Primary     Continue with Prednisone 5mg  by month. Multiple sites pain--controlled on Fentanyl 48mcg/hr                       Review of Systems:  Review of Systems  Constitutional: Positive for weight loss and malaise/fatigue. Negative for fever, chills and diaphoresis.  HENT: Positive for hearing loss. Negative for ear pain, nosebleeds, congestion, sore throat and neck pain.   Eyes: Negative for pain, discharge and redness.  Respiratory: Positive for cough (hacking) and shortness of breath. Negative for sputum production and wheezing.   Cardiovascular: Positive for leg swelling and PND. Negative for chest pain, palpitations, orthopnea and claudication.  Gastrointestinal: Positive for  abdominal pain. Negative for heartburn, nausea, vomiting, diarrhea, constipation and blood in stool.  Genitourinary: Positive for frequency. Negative for dysuria, urgency, hematuria and flank pain.  Musculoskeletal: Positive for myalgias, back pain and joint pain.  Skin: Negative for itching and rash.       The ulna aspect of the right hand swelling, pain, warmth  Neurological: Negative for dizziness, tingling, tremors, sensory change, speech change, focal weakness, seizures, loss of consciousness, weakness and headaches.  Endo/Heme/Allergies: Negative for environmental allergies and polydipsia. Bruises/bleeds easily.  Psychiatric/Behavioral: Positive for memory loss. Negative for depression and hallucinations. The patient is not nervous/anxious and does not have insomnia.      Past Medical History  Diagnosis Date  . PMR (polymyalgia rheumatica)   . Temporal arteritis   . Osteoporosis   . Hearing loss   . Wears glasses   . Rectal bleeding   . Arthritis   . Rectal prolapse   . Hypertension   . Mitral valve prolapse   . Spinal stenosis   . Alzheimer's dementia   . Hyperlipidemia   . GERD (gastroesophageal reflux disease)   . Sciatica   . Scoliosis   . Esophageal varices   . Hiatal hernia   . Atrial fibrillation   . Unspecified constipation   . Other specified circulatory system disorders   .  Internal hemorrhoids without mention of complication   . External hemorrhoids without mention of complication   . Sciatica   . Pain in joint, ankle and foot   . Abdominal pain, unspecified site    Past Surgical History  Procedure Laterality Date  . Abdominal hysterectomy    . Appendectomy    . Colonoscopy    . Breast surgery      left  . Hernia repair      LIH  . Esophagogastroduodenoscopy  11/02/2011    Procedure: ESOPHAGOGASTRODUODENOSCOPY (EGD);  Surgeon: Yancey Flemings, MD;  Location: [facility] ENDOSCOPY;  Service: Endoscopy;  Laterality: N/A;  . Rectal prolapse repair, rectopexy   11/16/2010    with sigmoid colectomy.  Repair w biologic mesh in pelvis  . Cholecystectomy  12/12/11  . Esophagogastroduodenoscopy N/A 01/10/2013    Procedure: ESOPHAGOGASTRODUODENOSCOPY (EGD);  Surgeon: Meryl Dare, MD;  Location: [facility] ENDOSCOPY;  Service: Endoscopy;  Laterality: N/A;  . Esophagogastroduodenoscopy N/A 02/12/2013    Procedure: ESOPHAGOGASTRODUODENOSCOPY (EGD);  Surgeon: Hilarie Fredrickson, MD;  Location: [facility] ENDOSCOPY;  Service: Endoscopy;  Laterality: N/A;   Social History:   reports that she has never smoked. She has never used smokeless tobacco. She reports that she does not drink alcohol or use illicit drugs.  Family History  Problem Relation Age of Onset  . Bladder Cancer Brother     Medications: Reviewed at [facility]   Physical Exam: Physical Exam  Constitutional: She is oriented to person, place, and time. She appears well-developed and well-nourished. No distress.  HENT:  Head: Normocephalic and atraumatic.  Right Ear: External ear normal.  Left Ear: External ear normal.  Nose: Nose normal.  Mouth/Throat: Oropharynx is clear and moist. No oropharyngeal exudate.  Eyes: Conjunctivae and EOM are normal. Pupils are equal, round, and reactive to light. Right eye exhibits no discharge. Left eye exhibits no discharge. No scleral icterus.  Neck: Normal range of motion. Neck supple. No JVD present. No thyromegaly present.  Cardiovascular: Normal rate, regular rhythm and intact distal pulses.   Murmur heard.  Systolic murmur is present with a grade of 3/6  Pulmonary/Chest: Tachypnea noted. No respiratory distress. She has no decreased breath sounds. She has no wheezes. She has rhonchi (corse breath sound diffused). She has rales in the right lower field and the left lower field. She exhibits no tenderness.  Abdominal: Soft. Bowel sounds are normal. She exhibits no distension and no mass. There is no tenderness. There is no rebound and no guarding.  Genitourinary: Vagina normal. No  vaginal discharge found.  Musculoskeletal: Normal range of motion. She exhibits no tenderness. Edema: ankles-improved.  kyphosis  Lymphadenopathy:    She has no cervical adenopathy.  Neurological: She is alert and oriented to person, place, and time. She has normal reflexes. No cranial nerve deficit. She exhibits normal muscle tone. Coordination normal.  Skin: Skin is warm and dry. No rash noted. She is not diaphoretic. No erythema.  Bilateral lower leg abrasions-healed. The ulna aspect of the right hand warmth/tenderness/swelling.   Psychiatric: Judgment normal. Her mood appears not anxious. Her affect is not angry, not blunt, not labile and not inappropriate. Her speech is not rapid and/or pressured, not delayed, not tangential and not slurred. She is not agitated, not aggressive, not hyperactive, not slowed, not withdrawn, not actively hallucinating and not combative. Thought content is not paranoid and not delusional. Cognition and memory are impaired. She does not express impulsivity or inappropriate judgment. She does not exhibit a depressed mood. She  exhibits abnormal recent memory. She exhibits normal remote memory.    Filed Vitals:   06/19/13 1311  BP: 118/58  Pulse: 68  Temp: 97.4 F (36.3 C)  TempSrc: Tympanic  Resp: 18      Labs reviewed: Basic Metabolic Panel:  Recent Labs  16/10/96 2023 01/12/13 0930 01/14/13 0500  02/13/13 04/17/13 05/13/13  NA 140 139 137  < > 139 130* 140  140  K 5.1 3.5 3.5  < > 4.0 4.2 4.2  4.2  CL 108 107 106  --   --   --   --   CO2 25 28 27   --   --   --   --   GLUCOSE 133* 104* 110*  --   --   --   --   BUN 46* 27* 23  < > 17 68* 24*  24*  CREATININE 0.84 0.58 0.53  < > 0.5 0.5 0.5  0.5  CALCIUM 8.8 7.6* 7.7*  --   --   --   --   TSH  --   --   --   --   --  1.32  --   < > = values in this interval not displayed. Liver Function Tests:  Recent Labs  01/09/13 2023 01/14/13 0500 01/21/13 04/17/13 05/13/13  AST 36 65* 54* 64* 71*   71*  ALT 28 53* 47* 48* 52*  52*  ALKPHOS 241* 348* 460* 470* 463*  463*  BILITOT 0.9 1.7*  --   --   --   PROT 5.0* 4.5*  --   --   --   ALBUMIN 2.5* 2.3*  --   --   --     Recent Labs  01/09/13 2023  LIPASE 43   CBC:  Recent Labs  07/11/12 0934 01/09/13 2023  01/14/13 0500 01/21/13 04/17/13 05/13/13  WBC 5.4 7.3  --  7.2 7.0 6.4 6.3  6.3  NEUTROABS  --  5.7  --   --   --   --   --   HGB 13.2 6.8*  < > 11.4* 11.9* 12.0 12.1  12.1  HCT 39.1 20.0*  < > 34.3* 36 36 37  MCV 102.1* 94.3  --  93.7  --   --   --   PLT 129* 143*  --  84* 121* 153 167  < > = values in this interval not displayed.  Past Procedures:  02/06/13 [place] abd: free intraperitoneal fluid/ascites is seen in all four quadrants of the abdomen to a moderate to large extent.    Assessment/Plan Polymyalgia rheumatica Continue with Prednisone 5mg  by month. Multiple sites pain--controlled on Fentanyl 62mcg/hr                  Dementia, in, senility Currently taking Aricept, SNF for care              CHF (congestive heart failure) 02/06/13 [place] abd showed free intraperitoneal fluid/ascites is seen in all four quadrants of the abdomen to a moderate to large extent, bilateral pleural effusion are noted as well. Furosemide 20mg  and 40mg  alternating dose since 02/05/13. Bun/creat 17/0.50 02/13/13 and 24/0.52 05/13/13 Weight loss persisted. Elevated LFT persisted AST. ALT, alk phos.           Abnormal LFTs Persisted with elevated AST, ALT, alk phos    Esophageal varices with hemorrhage F/u GI, takes Nadolol, s/p bands    Esophagitis, erosive Stable on Protonix 40mg  bid.  Family/ Staff Communication: observe the patient  Goals of Care: SNF  Labs/tests ordered: none

## 2013-06-19 NOTE — Assessment & Plan Note (Signed)
Stable on Protonix 40mg bid.   

## 2013-06-19 NOTE — Assessment & Plan Note (Signed)
Continue with Prednisone 5mg  by month. Multiple sites pain--controlled on Fentanyl 93mcg/hr

## 2013-06-19 NOTE — Assessment & Plan Note (Signed)
F/u GI, takes Nadolol, s/p bands         

## 2013-06-19 NOTE — Assessment & Plan Note (Signed)
Persisted with elevated AST, ALT, alk phos

## 2013-06-19 NOTE — Assessment & Plan Note (Signed)
02/06/13 [place] abd showed free intraperitoneal fluid/ascites is seen in all four quadrants of the abdomen to a moderate to large extent, bilateral pleural effusion are noted as well. Furosemide 20mg  and 40mg  alternating dose since 02/05/13. Bun/creat 17/0.50 02/13/13 and 24/0.52 05/13/13 Weight loss persisted. Elevated LFT persisted AST. ALT, alk phos.

## 2013-06-19 NOTE — Assessment & Plan Note (Signed)
Currently taking Aricept, SNF for care     

## 2013-07-16 ENCOUNTER — Encounter: Payer: Self-pay | Admitting: Nurse Practitioner

## 2013-07-16 ENCOUNTER — Non-Acute Institutional Stay (SKILLED_NURSING_FACILITY): Payer: Medicare Other | Admitting: Nurse Practitioner

## 2013-07-16 DIAGNOSIS — F32A Depression, unspecified: Secondary | ICD-10-CM

## 2013-07-16 DIAGNOSIS — R188 Other ascites: Secondary | ICD-10-CM

## 2013-07-16 DIAGNOSIS — R748 Abnormal levels of other serum enzymes: Secondary | ICD-10-CM

## 2013-07-16 DIAGNOSIS — F0390 Unspecified dementia without behavioral disturbance: Secondary | ICD-10-CM

## 2013-07-16 DIAGNOSIS — I509 Heart failure, unspecified: Secondary | ICD-10-CM

## 2013-07-16 DIAGNOSIS — M353 Polymyalgia rheumatica: Secondary | ICD-10-CM

## 2013-07-16 DIAGNOSIS — F329 Major depressive disorder, single episode, unspecified: Secondary | ICD-10-CM

## 2013-07-16 DIAGNOSIS — N952 Postmenopausal atrophic vaginitis: Secondary | ICD-10-CM

## 2013-07-16 DIAGNOSIS — I8501 Esophageal varices with bleeding: Secondary | ICD-10-CM

## 2013-07-16 DIAGNOSIS — D649 Anemia, unspecified: Secondary | ICD-10-CM

## 2013-07-16 DIAGNOSIS — K2210 Ulcer of esophagus without bleeding: Secondary | ICD-10-CM

## 2013-07-16 DIAGNOSIS — I10 Essential (primary) hypertension: Secondary | ICD-10-CM

## 2013-07-16 NOTE — Assessment & Plan Note (Signed)
Blood pressure controlled. 

## 2013-07-16 NOTE — Assessment & Plan Note (Signed)
Continue PPI ?

## 2013-07-16 NOTE — Assessment & Plan Note (Signed)
02/06/13 [place] abd showed free intraperitoneal fluid/ascites is seen in all four quadrants of the abdomen to a moderate to large extent, bilateral pleural effusion are noted as well. Furosemide 20mg  and 40mg  alternating dose since 02/05/13. Bun/creat 17/0.50 02/13/13 and 24/0.52 05/13/13 Weight loss persisted. Elevated LFT persisted AST. ALT, alk phos. Update LFT and abd [place]

## 2013-07-16 NOTE — Progress Notes (Signed)
Patient ID: Tammy Myers, female   DOB: 08-Apr-1921, 79 y.o.   MRN: [id number]  Code Status: DNR  Allergies  Allergen Reactions  . Caffeine   . Fosamax [Alendronate Sodium]   . Sudafed [Pseudoephedrine Hcl]     Chief Complaint  Patient presents with  . Medical Managment of Chronic Issues    ascites.    HPI: Patient is a 79 y.o. female seen in the SNF at [facility] today for evaluation of ascites and chronic medical conditions.      Problem List Items Addressed This Visit   Anemia     Update CBC    Ascites (Chronic)     02/06/13 [place] abd showed free intraperitoneal fluid/ascites is seen in all four quadrants of the abdomen to a moderate to large extent, bilateral pleural effusion are noted as well. Furosemide 20mg  and 40mg  alternating dose since 02/05/13. Bun/creat 17/0.50 02/13/13 and 24/0.52 05/13/13 Weight loss persisted. Elevated LFT persisted AST. ALT, alk phos. Update LFT and abd [place]              CHF (congestive heart failure)     Compensated. O2 dependent is related to restricted lung capacity due to large ascites.               Dementia, in, senility     Currently taking Aricept, SNF for care                Depression     Sad facial looks, weight loss, poor appetite--no better sinceRemeron 15mg  po daily. My opinion her debility is the culprit.           elevated alkaline phosphatase     Chronic, update CMP    Esophageal varices with hemorrhage     F/u GI, takes Nadolol, s/p bands        Esophagitis, erosive     Continue PPI    HYPERTENSION     Blood pressure controlled          Polymyalgia rheumatica     Continue with Prednisone 5mg  by month. Multiple sites pain--controlled on Fentanyl 59mcg/hr                      Vaginitis, atrophic - Primary     Off HRT since it may predispose to stones.              Review of Systems:  Review of Systems  Constitutional: Positive for weight  loss and malaise/fatigue. Negative for fever, chills and diaphoresis.  HENT: Positive for hearing loss. Negative for congestion, ear pain, nosebleeds and sore throat.   Eyes: Negative for pain, discharge and redness.  Respiratory: Positive for cough (hacking) and shortness of breath. Negative for sputum production and wheezing.   Cardiovascular: Positive for PND. Negative for chest pain, palpitations, orthopnea, claudication and leg swelling.  Gastrointestinal: Positive for abdominal pain. Negative for heartburn, nausea, vomiting, diarrhea, constipation and blood in stool.       Ascites-feels tight  Genitourinary: Positive for frequency. Negative for dysuria, urgency, hematuria and flank pain.  Musculoskeletal: Positive for back pain, joint pain and myalgias. Negative for neck pain.  Skin: Negative for itching and rash.       The ulna aspect of the right hand swelling, pain, warmth-improving.   Neurological: Negative for dizziness, tingling, tremors, sensory change, speech change, focal weakness, seizures, loss of consciousness, weakness and headaches.  Endo/Heme/Allergies: Negative for environmental allergies and polydipsia. Bruises/bleeds easily.  Psychiatric/Behavioral: Positive for depression and memory loss. Negative for hallucinations. The patient is not nervous/anxious and does not have insomnia.      Past Medical History  Diagnosis Date  . PMR (polymyalgia rheumatica)   . Temporal arteritis   . Osteoporosis   . Hearing loss   . Wears glasses   . Rectal bleeding   . Arthritis   . Rectal prolapse   . Hypertension   . Mitral valve prolapse   . Spinal stenosis   . Alzheimer's dementia   . Hyperlipidemia   . GERD (gastroesophageal reflux disease)   . Sciatica   . Scoliosis   . Esophageal varices   . Hiatal hernia   . Atrial fibrillation   . Unspecified constipation   . Other specified circulatory system disorders   . Internal hemorrhoids without mention of complication   .  External hemorrhoids without mention of complication   . Sciatica   . Pain in joint, ankle and foot   . Abdominal pain, unspecified site    Past Surgical History  Procedure Laterality Date  . Abdominal hysterectomy    . Appendectomy    . Colonoscopy    . Breast surgery      left  . Hernia repair      LIH  . Esophagogastroduodenoscopy  11/02/2011    Procedure: ESOPHAGOGASTRODUODENOSCOPY (EGD);  Surgeon: Yancey Flemings, MD;  Location: [facility] ENDOSCOPY;  Service: Endoscopy;  Laterality: N/A;  . Rectal prolapse repair, rectopexy  11/16/2010    with sigmoid colectomy.  Repair w biologic mesh in pelvis  . Cholecystectomy  12/12/11  . Esophagogastroduodenoscopy N/A 01/10/2013    Procedure: ESOPHAGOGASTRODUODENOSCOPY (EGD);  Surgeon: Meryl Dare, MD;  Location: [facility] ENDOSCOPY;  Service: Endoscopy;  Laterality: N/A;  . Esophagogastroduodenoscopy N/A 02/12/2013    Procedure: ESOPHAGOGASTRODUODENOSCOPY (EGD);  Surgeon: Hilarie Fredrickson, MD;  Location: [facility] ENDOSCOPY;  Service: Endoscopy;  Laterality: N/A;   Social History:   reports that she has never smoked. She has never used smokeless tobacco. She reports that she does not drink alcohol or use illicit drugs.  Family History  Problem Relation Age of Onset  . Bladder Cancer Brother     Medications:   Medication List       This list is accurate as of: 07/16/13 11:26 AM.  Always use your most recent med list.               AMBULATORY NON FORMULARY MEDICATION  O2 @@ 2 LPM     CALCIUM 600 600 MG Tabs tablet  Generic drug:  calcium carbonate  Take 600 mg by mouth 2 (two) times daily with a meal.     donepezil 10 MG tablet  Commonly known as:  ARICEPT  Take 10 mg by mouth at bedtime.     fentaNYL 25 MCG/HR patch  Commonly known as:  DURAGESIC - dosed mcg/hr  Place 1 patch (25 mcg total) onto the skin every 3 (three) days.     furosemide 20 MG tablet  Commonly known as:  LASIX  Take 20 mg by mouth daily. Alternating 20mg  and 40mg       HYDROcodone-acetaminophen 5-500 MG per tablet  Commonly known as:  VICODIN  Take 1 tablet by mouth every 6 (six) hours as needed for pain.     nadolol 40 MG tablet  Commonly known as:  CORGARD  Take 1 tablet (40 mg total) by mouth daily.     pantoprazole 40 MG tablet  Commonly known as:  PROTONIX  Take 40 mg by mouth 2 (two) times daily.     potassium chloride 10 MEQ tablet  Commonly known as:  K-DUR,KLOR-CON  Take 10 mEq by mouth daily.     predniSONE 5 MG tablet  Commonly known as:  DELTASONE  Take 5 mg by mouth daily.     witch hazel-glycerin pad  Commonly known as:  TUCKS  Place 1 application rectally as needed for pain.           Physical Exam: Physical Exam  Constitutional: She is oriented to person, place, and time. She appears well-developed and well-nourished. No distress.  HENT:  Head: Normocephalic and atraumatic.  Right Ear: External ear normal.  Left Ear: External ear normal.  Nose: Nose normal.  Mouth/Throat: Oropharynx is clear and moist. No oropharyngeal exudate.  Eyes: Conjunctivae and EOM are normal. Pupils are equal, round, and reactive to light. Right eye exhibits no discharge. Left eye exhibits no discharge. No scleral icterus.  Neck: Normal range of motion. Neck supple. No JVD present. No thyromegaly present.  Cardiovascular: Normal rate, regular rhythm and intact distal pulses.   Murmur heard.  Systolic murmur is present with a grade of 3/6  Pulmonary/Chest: Tachypnea noted. No respiratory distress. She has no decreased breath sounds. She has no wheezes. She has rhonchi (corse breath sound diffused). She has rales in the right lower field and the left lower field. She exhibits no tenderness.  Abdominal: Soft. Bowel sounds are normal. She exhibits no distension and no mass. There is no tenderness. There is no rebound and no guarding.  Genitourinary: Vagina normal. No vaginal discharge found.  Musculoskeletal: Normal range of motion. She exhibits no  tenderness. Edema: ankles-improved.  kyphosis  Lymphadenopathy:    She has no cervical adenopathy.  Neurological: She is alert and oriented to person, place, and time. She has normal reflexes. No cranial nerve deficit. She exhibits normal muscle tone. Coordination normal.  Skin: Skin is warm and dry. No rash noted. She is not diaphoretic. No erythema.  The ulna aspect of the right hand warmth/tenderness/swelling-Resolving.   Psychiatric: Judgment normal. Her mood appears not anxious. Her affect is not angry, not blunt, not labile and not inappropriate. Her speech is not rapid and/or pressured, not delayed, not tangential and not slurred. She is not agitated, not aggressive, not hyperactive, not slowed, not withdrawn, not actively hallucinating and not combative. Thought content is not paranoid and not delusional. Cognition and memory are impaired. She does not express impulsivity or inappropriate judgment. She does not exhibit a depressed mood. She exhibits abnormal recent memory. She exhibits normal remote memory.    Filed Vitals:   07/16/13 1100  BP: 122/66  Pulse: 74  Temp: 98.3 F (36.8 C)  TempSrc: Tympanic  Resp: 24      Labs reviewed: Basic Metabolic Panel:  Recent Labs  40/98/11 2023 01/12/13 0930 01/14/13 0500  02/13/13 04/17/13 05/13/13  NA 140 139 137  < > 139 130* 140  140  K 5.1 3.5 3.5  < > 4.0 4.2 4.2  4.2  CL 108 107 106  --   --   --   --   CO2 25 28 27   --   --   --   --   GLUCOSE 133* 104* 110*  --   --   --   --   BUN 46* 27* 23  < > 17 68* 24*  24*  CREATININE 0.84 0.58 0.53  < > 0.5 0.5  0.5  0.5  CALCIUM 8.8 7.6* 7.7*  --   --   --   --   TSH  --   --   --   --   --  1.32  --   < > = values in this interval not displayed. Liver Function Tests:  Recent Labs  01/09/13 2023 01/14/13 0500 01/21/13 04/17/13 05/13/13  AST 36 65* 54* 64* 71*  71*  ALT 28 53* 47* 48* 52*  52*  ALKPHOS 241* 348* 460* 470* 463*  463*  BILITOT 0.9 1.7*  --   --   --    PROT 5.0* 4.5*  --   --   --   ALBUMIN 2.5* 2.3*  --   --   --     Recent Labs  01/09/13 2023  LIPASE 43   CBC:  Recent Labs  01/09/13 2023  01/14/13 0500 01/21/13 04/17/13 05/13/13  WBC 7.3  --  7.2 7.0 6.4 6.3  6.3  NEUTROABS 5.7  --   --   --   --   --   HGB 6.8*  < > 11.4* 11.9* 12.0 12.1  12.1  HCT 20.0*  < > 34.3* 36 36 37  MCV 94.3  --  93.7  --   --   --   PLT 143*  --  84* 121* 153 167  < > = values in this interval not displayed.  Past Procedures:  02/06/13 [place] abd: free intraperitoneal fluid/ascites is seen in all four quadrants of the abdomen to a moderate to large extent.    Assessment/Plan Vaginitis, atrophic Off HRT since it may predispose to stones.         Polymyalgia rheumatica Continue with Prednisone 5mg  by month. Multiple sites pain--controlled on Fentanyl 90mcg/hr                    HYPERTENSION Blood pressure controlled        Esophageal varices with hemorrhage F/u GI, takes Nadolol, s/p bands      elevated alkaline phosphatase Chronic, update CMP  Depression Sad facial looks, weight loss, poor appetite--no better sinceRemeron 15mg  po daily. My opinion her debility is the culprit.         Dementia, in, senility Currently taking Aricept, SNF for care              CHF (congestive heart failure) Compensated. O2 dependent is related to restricted lung capacity due to large ascites.             Ascites 02/06/13 [place] abd showed free intraperitoneal fluid/ascites is seen in all four quadrants of the abdomen to a moderate to large extent, bilateral pleural effusion are noted as well. Furosemide 20mg  and 40mg  alternating dose since 02/05/13. Bun/creat 17/0.50 02/13/13 and 24/0.52 05/13/13 Weight loss persisted. Elevated LFT persisted AST. ALT, alk phos. Update LFT and abd [place]            Anemia Update CBC  Esophagitis, erosive Continue PPI    Family/ Staff Communication: observe  the patient  Goals of Care: SNF  Labs/tests ordered: CBC, CMP, abd [place]

## 2013-07-16 NOTE — Assessment & Plan Note (Signed)
Off HRT since it may predispose to stones.

## 2013-07-16 NOTE — Assessment & Plan Note (Signed)
Chronic, update CMP °

## 2013-07-16 NOTE — Assessment & Plan Note (Signed)
Update CBC. 

## 2013-07-16 NOTE — Assessment & Plan Note (Signed)
Continue with Prednisone 5mg by month. Multiple sites pain--controlled on Fentanyl 25mcg/hr                 

## 2013-07-16 NOTE — Assessment & Plan Note (Signed)
Currently taking Aricept, SNF for care

## 2013-07-16 NOTE — Assessment & Plan Note (Signed)
F/u GI, takes Nadolol, s/p bands         

## 2013-07-16 NOTE — Assessment & Plan Note (Signed)
Compensated. O2 dependent is related to restricted lung capacity due to large ascites.

## 2013-07-16 NOTE — Assessment & Plan Note (Signed)
Sad facial looks, weight loss, poor appetite--no better sinceRemeron 15mg  po daily. My opinion her debility is the culprit.

## 2013-07-17 ENCOUNTER — Encounter: Payer: Self-pay | Admitting: *Deleted

## 2013-07-17 LAB — BASIC METABOLIC PANEL
BUN: 24 mg/dL — AB (ref 4–21)
Creatinine: 0.4 mg/dL — AB (ref 0.5–1.1)
Potassium: 4.1 mmol/L (ref 3.4–5.3)
Sodium: 140 mmol/L (ref 137–147)

## 2013-07-17 LAB — HEPATIC FUNCTION PANEL: Alkaline Phosphatase: 251 U/L — AB (ref 25–125)

## 2013-07-17 LAB — CBC AND DIFFERENTIAL: HCT: 35 % — AB (ref 36–46)

## 2013-07-28 ENCOUNTER — Encounter: Payer: Self-pay | Admitting: Nurse Practitioner

## 2013-07-28 ENCOUNTER — Non-Acute Institutional Stay (SKILLED_NURSING_FACILITY): Payer: Medicare Other | Admitting: Nurse Practitioner

## 2013-07-28 DIAGNOSIS — M353 Polymyalgia rheumatica: Secondary | ICD-10-CM

## 2013-07-28 DIAGNOSIS — R945 Abnormal results of liver function studies: Secondary | ICD-10-CM

## 2013-07-28 DIAGNOSIS — D649 Anemia, unspecified: Secondary | ICD-10-CM

## 2013-07-28 DIAGNOSIS — R7989 Other specified abnormal findings of blood chemistry: Secondary | ICD-10-CM

## 2013-07-28 DIAGNOSIS — F329 Major depressive disorder, single episode, unspecified: Secondary | ICD-10-CM

## 2013-07-28 DIAGNOSIS — K8010 Calculus of gallbladder with chronic cholecystitis without obstruction: Secondary | ICD-10-CM

## 2013-07-28 DIAGNOSIS — R188 Other ascites: Secondary | ICD-10-CM

## 2013-07-28 DIAGNOSIS — I509 Heart failure, unspecified: Secondary | ICD-10-CM

## 2013-07-28 DIAGNOSIS — K2210 Ulcer of esophagus without bleeding: Secondary | ICD-10-CM

## 2013-07-28 DIAGNOSIS — F0390 Unspecified dementia without behavioral disturbance: Secondary | ICD-10-CM

## 2013-07-28 DIAGNOSIS — I8501 Esophageal varices with bleeding: Secondary | ICD-10-CM

## 2013-07-28 DIAGNOSIS — I4891 Unspecified atrial fibrillation: Secondary | ICD-10-CM

## 2013-07-28 NOTE — Assessment & Plan Note (Signed)
Hgb 11.8 07/17/13

## 2013-07-28 NOTE — Assessment & Plan Note (Addendum)
Sad facial looks, weight loss, poor appetite--no better sinceRemeron 15mg  po daily-down to 7.5mg  now. My opinion her debility is the culprit.

## 2013-07-28 NOTE — Assessment & Plan Note (Signed)
Risk reduction by considering dc Estradiol. Pain is managed with Fentanyl and Vicodin q6hr prn. N/V/Jaundice-will check CBC, CMP, Amylase, Lipase. Zofran 4mg  q6hr. Decrease Furosemide to 20mg  daily. Oral intake as tolerated for now. Hospice Referral. Dc OsCal

## 2013-07-28 NOTE — Assessment & Plan Note (Signed)
Currently taking Aricept, SNF for care. N/V/Juandice/difficulty with oral intake--dc Aricept-R>B

## 2013-07-28 NOTE — Assessment & Plan Note (Signed)
Continue with Prednisone 5mg by month. Multiple sites pain--controlled on Fentanyl 25mcg/hr                 

## 2013-07-28 NOTE — Assessment & Plan Note (Signed)
Still rate controlled.

## 2013-07-28 NOTE — Assessment & Plan Note (Signed)
Compensated. O2 dependent is related to restricted lung capacity due to large ascites. Will decrease Furosemide to 20mg  daily 2nd to limited oral intake, f/u CMP in am.

## 2013-07-28 NOTE — Assessment & Plan Note (Signed)
F/u GI, takes Nadolol, s/p bands         

## 2013-07-28 NOTE — Assessment & Plan Note (Signed)
Continue PPI ?

## 2013-07-28 NOTE — Assessment & Plan Note (Signed)
Persists.  

## 2013-07-28 NOTE — Assessment & Plan Note (Signed)
02/06/13 [place] abd showed free intraperitoneal fluid/ascites is seen in all four quadrants of the abdomen to a moderate to large extent, bilateral pleural effusion are noted as well. 07/17/13 [place] abd: small amount of ascites seen at the right upper quadrant and right lower quadrant. Small right pleural effusion. Decrease Furosemide to 20mg  daily.

## 2013-07-28 NOTE — Progress Notes (Signed)
Patient ID: Tammy Myers, female   DOB: 10-10-1920, 79 y.o.   MRN: [id number]  Code Status: DNR  Allergies  Allergen Reactions  . Caffeine   . Fosamax [Alendronate Sodium]   . Sudafed [Pseudoephedrine Hcl]     Chief Complaint  Patient presents with  . Medical Managment of Chronic Issues    N/V/jaundice.   . Acute Visit    HPI: Patient is a 79 y.o. female seen in the SNF at [facility] today for evaluation of nausea, vomiting for 2 days, jaundice,  ascites and chronic medical conditions.      Problem List Items Addressed This Visit   Abnormal LFTs     Persists.     Anemia     Hgb 11.8 07/17/13      Ascites (Chronic)     02/06/13 [place] abd showed free intraperitoneal fluid/ascites is seen in all four quadrants of the abdomen to a moderate to large extent, bilateral pleural effusion are noted as well. 07/17/13 [place] abd: small amount of ascites seen at the right upper quadrant and right lower quadrant. Small right pleural effusion. Decrease Furosemide to 20mg  daily.                 ATRIAL FIBRILLATION, PAROXYSMAL     Still rate controlled.     CHF (congestive heart failure)     Compensated. O2 dependent is related to restricted lung capacity due to large ascites. Will decrease Furosemide to 20mg  daily 2nd to limited oral intake, f/u CMP in am.                 Chronic cholecystitis with calculus     Risk reduction by considering dc Estradiol. Pain is managed with Fentanyl and Vicodin q6hr prn. N/V/Jaundice-will check CBC, CMP, Amylase, Lipase. Zofran 4mg  q6hr. Decrease Furosemide to 20mg  daily. Oral intake as tolerated for now. Hospice Referral. Dc OsCal              Dementia, in, senility     Currently taking Aricept, SNF for care. N/V/Juandice/difficulty with oral intake--dc Aricept-R>B                  Depression     Sad facial looks, weight loss, poor appetite--no better sinceRemeron 15mg  po daily-down to 7.5mg   now. My opinion her debility is the culprit.             Esophageal varices with hemorrhage     F/u GI, takes Nadolol, s/p bands          Esophagitis, erosive     Continue PPI      Polymyalgia rheumatica - Primary     Continue with Prednisone 5mg  by month. Multiple sites pain--controlled on Fentanyl 47mcg/hr                           Review of Systems:  Review of Systems  Constitutional: Positive for weight loss and malaise/fatigue. Negative for fever, chills and diaphoresis.  HENT: Positive for hearing loss. Negative for congestion, ear pain, nosebleeds and sore throat.   Eyes: Negative for pain, discharge and redness.  Respiratory: Positive for cough (hacking) and shortness of breath. Negative for sputum production and wheezing.   Cardiovascular: Positive for PND. Negative for chest pain, palpitations, orthopnea, claudication and leg swelling.  Gastrointestinal: Positive for nausea, vomiting, abdominal pain and diarrhea. Negative for heartburn, constipation and blood in stool.       Ascites-feels  tight  Genitourinary: Positive for frequency. Negative for dysuria, urgency, hematuria and flank pain.  Musculoskeletal: Positive for back pain, joint pain and myalgias. Negative for neck pain.  Skin: Negative for itching and rash.       juandice  Neurological: Negative for dizziness, tingling, tremors, sensory change, speech change, focal weakness, seizures, loss of consciousness, weakness and headaches.  Endo/Heme/Allergies: Negative for environmental allergies and polydipsia. Bruises/bleeds easily.  Psychiatric/Behavioral: Positive for depression and memory loss. Negative for hallucinations. The patient is not nervous/anxious and does not have insomnia.      Past Medical History  Diagnosis Date  . PMR (polymyalgia rheumatica)   . Temporal arteritis   . Osteoporosis   . Hearing loss   . Wears glasses   . Rectal bleeding   . Arthritis   .  Rectal prolapse   . Hypertension   . Mitral valve prolapse   . Spinal stenosis   . Alzheimer's dementia   . Hyperlipidemia   . GERD (gastroesophageal reflux disease)   . Sciatica   . Scoliosis   . Esophageal varices   . Hiatal hernia   . Atrial fibrillation   . Unspecified constipation   . Other specified circulatory system disorders   . Internal hemorrhoids without mention of complication   . External hemorrhoids without mention of complication   . Sciatica   . Pain in joint, ankle and foot   . Abdominal pain, unspecified site    Past Surgical History  Procedure Laterality Date  . Abdominal hysterectomy    . Appendectomy    . Colonoscopy    . Breast surgery      left  . Hernia repair      [facility]  . Esophagogastroduodenoscopy  11/02/2011    Procedure: ESOPHAGOGASTRODUODENOSCOPY (EGD);  Surgeon: Yancey Flemings, MD;  Location: [facility] ENDOSCOPY;  Service: Endoscopy;  Laterality: N/A;  . Rectal prolapse repair, rectopexy  11/16/2010    with sigmoid colectomy.  Repair w biologic mesh in pelvis  . Cholecystectomy  12/12/11  . Esophagogastroduodenoscopy N/A 01/10/2013    Procedure: ESOPHAGOGASTRODUODENOSCOPY (EGD);  Surgeon: Meryl Dare, MD;  Location: [facility] ENDOSCOPY;  Service: Endoscopy;  Laterality: N/A;  . Esophagogastroduodenoscopy N/A 02/12/2013    Procedure: ESOPHAGOGASTRODUODENOSCOPY (EGD);  Surgeon: Hilarie Fredrickson, MD;  Location: [facility] ENDOSCOPY;  Service: Endoscopy;  Laterality: N/A;   Social History:   reports that she has never smoked. She has never used smokeless tobacco. She reports that she does not drink alcohol or use illicit drugs.  Family History  Problem Relation Age of Onset  . Bladder Cancer Brother     Medications:   Medication List       This list is accurate as of: 07/28/13  3:35 PM.  Always use your most recent med list.               AMBULATORY NON FORMULARY MEDICATION  O2 @@ 2 LPM via nasal cannula to maintain SATs 89% monitor O2 SATs every shift     fentaNYL  25 MCG/HR patch  Commonly known as:  DURAGESIC - dosed mcg/hr  Place 1 patch (25 mcg total) onto the skin every 3 (three) days.     furosemide 20 MG tablet  Commonly known as:  LASIX  Take 20 mg by mouth daily.     HYDROcodone-acetaminophen 5-500 MG per tablet  Commonly known as:  VICODIN  Take 1 tablet by mouth every 6 (six) hours as needed for pain.     hydrocortisone 2.5 %  rectal cream  Commonly known as:  ANUSOL-HC  Place 1 application rectally as needed for hemorrhoids.     mirtazapine 15 MG tablet  Commonly known as:  REMERON  Take 7.5 mg by mouth at bedtime.     nadolol 40 MG tablet  Commonly known as:  CORGARD  Take 1 tablet (40 mg total) by mouth daily.     pantoprazole 40 MG tablet  Commonly known as:  PROTONIX  Take 40 mg by mouth 2 (two) times daily.     potassium chloride 10 MEQ tablet  Commonly known as:  K-DUR,KLOR-CON  Take 10 mEq by mouth daily.     predniSONE 5 MG tablet  Commonly known as:  DELTASONE  Take 5 mg by mouth daily.     RESOURCE 2.0 Liqd  Take 120 mLs by mouth 2 (two) times daily. Give resource 120 ml by mouth twice daily with Med Pass and add ice cream.     TUCKS 50 % Pads  Apply topically. Apply 1 pad rectally as needed for pain.     witch hazel-glycerin pad  Commonly known as:  TUCKS  Place 1 application rectally as needed for pain.           Physical Exam: Physical Exam  Constitutional: She is oriented to person, place, and time. She appears well-developed and well-nourished. No distress.  HENT:  Head: Normocephalic and atraumatic.  Right Ear: External ear normal.  Left Ear: External ear normal.  Nose: Nose normal.  Mouth/Throat: Oropharynx is clear and moist. No oropharyngeal exudate.  Eyes: Conjunctivae and EOM are normal. Pupils are equal, round, and reactive to light. Right eye exhibits no discharge. Left eye exhibits no discharge. Scleral icterus is present.  Neck: Normal range of motion. Neck supple. No JVD present.  No thyromegaly present.  Cardiovascular: Normal rate, regular rhythm and intact distal pulses.   Murmur heard.  Systolic murmur is present with a grade of 3/6  Pulmonary/Chest: Tachypnea noted. No respiratory distress. She has no decreased breath sounds. She has no wheezes. She has rhonchi (corse breath sound diffused). She has rales in the right lower field and the left lower field. She exhibits no tenderness.  Abdominal: Soft. Bowel sounds are normal. She exhibits no distension and no mass. There is no tenderness. There is no rebound and no guarding.  Genitourinary: Vagina normal. No vaginal discharge found.  Musculoskeletal: Normal range of motion. She exhibits no tenderness. Edema: ankles-improved.  kyphosis  Lymphadenopathy:    She has no cervical adenopathy.  Neurological: She is alert and oriented to person, place, and time. She has normal reflexes. No cranial nerve deficit. She exhibits normal muscle tone. Coordination normal.  Skin: Skin is warm and dry. No rash noted. She is not diaphoretic. No erythema.  juandice  Psychiatric: Judgment normal. Her mood appears not anxious. Her affect is not angry, not blunt, not labile and not inappropriate. Her speech is not rapid and/or pressured, not delayed, not tangential and not slurred. She is not agitated, not aggressive, not hyperactive, not slowed, not withdrawn, not actively hallucinating and not combative. Thought content is not paranoid and not delusional. Cognition and memory are impaired. She does not express impulsivity or inappropriate judgment. She does not exhibit a depressed mood. She exhibits abnormal recent memory. She exhibits normal remote memory.    Filed Vitals:   07/28/13 1512  BP: 160/70  Pulse: 68  Temp: 97.9 F (36.6 C)  TempSrc: Tympanic  Resp: 18  Labs reviewed: Basic Metabolic Panel:  Recent Labs  82/95/62 2023 01/12/13 0930 01/14/13 0500  02/13/13 04/17/13 05/13/13 07/17/13  NA 140 139 137  < > 139  130* 140  140 140  K 5.1 3.5 3.5  < > 4.0 4.2 4.2  4.2 4.1  CL 108 107 106  --   --   --   --   --   CO2 25 28 27   --   --   --   --   --   GLUCOSE 133* 104* 110*  --   --   --   --   --   BUN 46* 27* 23  < > 17 68* 24*  24* 24*  CREATININE 0.84 0.58 0.53  < > 0.5 0.5 0.5  0.5 0.4*  CALCIUM 8.8 7.6* 7.7*  --   --   --   --   --   TSH  --   --   --   --   --  1.32  --   --   < > = values in this interval not displayed. Liver Function Tests:  Recent Labs  01/09/13 2023 01/14/13 0500  04/17/13 05/13/13 07/17/13  AST 36 65*  < > 64* 71*  71* 61*  ALT 28 53*  < > 48* 52*  52* 45*  ALKPHOS 241* 348*  < > 470* 463*  463* 251*  BILITOT 0.9 1.7*  --   --   --   --   PROT 5.0* 4.5*  --   --   --   --   ALBUMIN 2.5* 2.3*  --   --   --   --   < > = values in this interval not displayed.  Recent Labs  01/09/13 2023  LIPASE 43   CBC:  Recent Labs  01/09/13 2023  01/14/13 0500  04/17/13 05/13/13 07/17/13  WBC 7.3  --  7.2  < > 6.4 6.3  6.3 7.2  NEUTROABS 5.7  --   --   --   --   --   --   HGB 6.8*  < > 11.4*  < > 12.0 12.1  12.1 11.8*  HCT 20.0*  < > 34.3*  < > 36 37 35*  MCV 94.3  --  93.7  --   --   --   --   PLT 143*  --  84*  < > 153 167 153  < > = values in this interval not displayed.  Past Procedures:  02/06/13 [place] abd: free intraperitoneal fluid/ascites is seen in all four quadrants of the abdomen to a moderate to large extent.   07/17/13 [place] abd: small amount of ascites seen at the right upper quadrant and right lower quadrant. Small right pleural effusion.    Assessment/Plan Polymyalgia rheumatica Continue with Prednisone 5mg  by month. Multiple sites pain--controlled on Fentanyl 50mcg/hr                      Esophagitis, erosive Continue PPI    Esophageal varices with hemorrhage F/u GI, takes Nadolol, s/p bands        Depression Sad facial looks, weight loss, poor appetite--no better sinceRemeron 15mg  po daily-down to 7.5mg  now.  My opinion her debility is the culprit.           Dementia, in, senility Currently taking Aricept, SNF for care. N/V/Juandice/difficulty with oral intake--dc Aricept-R>B  Chronic cholecystitis with calculus Risk reduction by considering dc Estradiol. Pain is managed with Fentanyl and Vicodin q6hr prn. N/V/Jaundice-will check CBC, CMP, Amylase, Lipase. Zofran 4mg  q6hr. Decrease Furosemide to 20mg  daily. Oral intake as tolerated for now. Hospice Referral. Dc OsCal            CHF (congestive heart failure) Compensated. O2 dependent is related to restricted lung capacity due to large ascites. Will decrease Furosemide to 20mg  daily 2nd to limited oral intake, f/u CMP in am.               ATRIAL FIBRILLATION, PAROXYSMAL Still rate controlled.   Ascites 02/06/13 [place] abd showed free intraperitoneal fluid/ascites is seen in all four quadrants of the abdomen to a moderate to large extent, bilateral pleural effusion are noted as well. 07/17/13 [place] abd: small amount of ascites seen at the right upper quadrant and right lower quadrant. Small right pleural effusion. Decrease Furosemide to 20mg  daily.               Anemia Hgb 11.8 07/17/13    Abnormal LFTs Persists.     Family/ Staff Communication: observe the patient. Hospice Referral.   Goals of Care: SNF  Labs/tests ordered: CBC, CMP, Amylase, Lipase

## 2013-07-30 LAB — CBC AND DIFFERENTIAL
HCT: 38 % (ref 36–46)
Hemoglobin: 12.9 g/dL (ref 12.0–16.0)
Platelets: 149 K/µL — AB (ref 150–399)
WBC: 8.9 10*3/mL

## 2013-07-30 LAB — BASIC METABOLIC PANEL
BUN: 29 mg/dL — AB (ref 4–21)
Creatinine: 0.7 mg/dL (ref 0.5–1.1)
Glucose: 130 mg/dL
Potassium: 4.1 mmol/L (ref 3.4–5.3)

## 2013-08-06 ENCOUNTER — Non-Acute Institutional Stay (SKILLED_NURSING_FACILITY): Payer: Medicare Other | Admitting: Nurse Practitioner

## 2013-08-06 ENCOUNTER — Encounter: Payer: Self-pay | Admitting: Nurse Practitioner

## 2013-08-06 DIAGNOSIS — K8010 Calculus of gallbladder with chronic cholecystitis without obstruction: Secondary | ICD-10-CM

## 2013-08-06 DIAGNOSIS — B377 Candidal sepsis: Secondary | ICD-10-CM

## 2013-08-06 DIAGNOSIS — F329 Major depressive disorder, single episode, unspecified: Secondary | ICD-10-CM

## 2013-08-06 DIAGNOSIS — M353 Polymyalgia rheumatica: Secondary | ICD-10-CM

## 2013-08-06 DIAGNOSIS — B372 Candidiasis of skin and nail: Secondary | ICD-10-CM | POA: Insufficient documentation

## 2013-08-06 DIAGNOSIS — R188 Other ascites: Secondary | ICD-10-CM

## 2013-08-06 DIAGNOSIS — I8501 Esophageal varices with bleeding: Secondary | ICD-10-CM

## 2013-08-06 DIAGNOSIS — K2210 Ulcer of esophagus without bleeding: Secondary | ICD-10-CM

## 2013-08-06 DIAGNOSIS — K648 Other hemorrhoids: Secondary | ICD-10-CM

## 2013-08-06 NOTE — Assessment & Plan Note (Signed)
Jaundice-elevated bilirubin 7 07/30/13--contribute to her N/V--Zofran helped.

## 2013-08-06 NOTE — Assessment & Plan Note (Addendum)
02/06/13 [place] abd showed free intraperitoneal fluid/ascites is seen in all four quadrants of the abdomen to a moderate to large extent, bilateral pleural effusion are noted as well. 07/17/13 [place] abd: small amount of ascites seen at the right upper quadrant and right lower quadrant. Small right pleural effusion. Decreased Furosemide to 20mg  daily. This is mainly her cause of distended abd-she has BM regularly.

## 2013-08-06 NOTE — Assessment & Plan Note (Signed)
F/u GI, takes Nadolol, s/p bands

## 2013-08-06 NOTE — Assessment & Plan Note (Signed)
Continue PPI ?

## 2013-08-06 NOTE — Progress Notes (Signed)
Patient ID: Tammy Myers, female   DOB: 1921-08-13, 79 y.o.   MRN: [id number]  Code Status: DNR  Allergies  Allergen Reactions  . Caffeine   . Fosamax [Alendronate Sodium]   . Sudafed [Pseudoephedrine Hcl]     Chief Complaint  Patient presents with  . Acute Visit    redness of intergluteal fold, hemorrhoids, ?constipation, distended abd, N/V    HPI: Patient is a 79 y.o. female seen in the SNF at [facility] today for evaluation of nausea, vomiting, redness of intergluteal fold/hemorrhoids,  jaundice,  Ascites/distended abd and chronic medical conditions.      Problem List Items Addressed This Visit   Ascites - Primary (Chronic)     02/06/13 [place] abd showed free intraperitoneal fluid/ascites is seen in all four quadrants of the abdomen to a moderate to large extent, bilateral pleural effusion are noted as well. 07/17/13 [place] abd: small amount of ascites seen at the right upper quadrant and right lower quadrant. Small right pleural effusion. Decreased Furosemide to 20mg  daily. This is mainly her cause of distended abd-she has BM regularly.                   Chronic cholecystitis with calculus     Jaundice-elevated bilirubin 7 07/30/13--contribute to her N/V--Zofran helped.     Depression     Sad facial looks, weight loss, poor appetite--no better sinceRemeron 15mg  po daily-down to 7.5mg  now. My opinion her debility is the culprit.               Diaper candidiasis     Intergluteal redness--Mycolog II cream bid     Esophageal varices with hemorrhage     F/u GI, takes Nadolol, s/p bands            Esophagitis, erosive     Continue PPI        Internal hemorrhoids with prolapse     No bleeding or s/s of infection--TUCK prn available to her. Also will apply Mycolog II cream bid to ease irritation in the area.     Polymyalgia rheumatica     Continue with Prednisone 5mg  by month. Multiple sites pain--controlled on Fentanyl  64mcg/hr                           Review of Systems:  Review of Systems  Constitutional: Positive for weight loss and malaise/fatigue. Negative for fever, chills and diaphoresis.  HENT: Positive for hearing loss. Negative for congestion, ear pain, nosebleeds and sore throat.   Eyes: Negative for pain, discharge and redness.  Respiratory: Positive for cough (hacking) and shortness of breath. Negative for sputum production and wheezing.   Cardiovascular: Positive for PND. Negative for chest pain, palpitations, orthopnea, claudication and leg swelling.  Gastrointestinal: Positive for nausea, vomiting and abdominal pain. Negative for heartburn, constipation and blood in stool.       Ascites-feels tight/distended abd. Multiple hemorrhoids-not infected or bleeding.   Genitourinary: Positive for frequency. Negative for dysuria, urgency, hematuria and flank pain.  Musculoskeletal: Positive for back pain, joint pain and myalgias. Negative for neck pain.  Skin: Negative for itching and rash.       Juandice. Intergluteal fold redness.   Neurological: Negative for dizziness, tingling, tremors, sensory change, speech change, focal weakness, seizures, loss of consciousness, weakness and headaches.  Endo/Heme/Allergies: Negative for environmental allergies and polydipsia. Bruises/bleeds easily.  Psychiatric/Behavioral: Positive for depression and memory loss. Negative for hallucinations. The  patient is not nervous/anxious and does not have insomnia.      Past Medical History  Diagnosis Date  . PMR (polymyalgia rheumatica)   . Temporal arteritis   . Osteoporosis   . Hearing loss   . Wears glasses   . Rectal bleeding   . Arthritis   . Rectal prolapse   . Hypertension   . Mitral valve prolapse   . Spinal stenosis   . Alzheimer's dementia   . Hyperlipidemia   . GERD (gastroesophageal reflux disease)   . Sciatica   . Scoliosis   . Esophageal varices   . Hiatal hernia   .  Atrial fibrillation   . Unspecified constipation   . Other specified circulatory system disorders   . Internal hemorrhoids without mention of complication   . External hemorrhoids without mention of complication   . Sciatica   . Pain in joint, ankle and foot   . Abdominal pain, unspecified site    Past Surgical History  Procedure Laterality Date  . Abdominal hysterectomy    . Appendectomy    . Colonoscopy    . Breast surgery      left  . Hernia repair      [facility]  . Esophagogastroduodenoscopy  11/02/2011    Procedure: ESOPHAGOGASTRODUODENOSCOPY (EGD);  Surgeon: Yancey Flemings, MD;  Location: [facility] ENDOSCOPY;  Service: Endoscopy;  Laterality: N/A;  . Rectal prolapse repair, rectopexy  11/16/2010    with sigmoid colectomy.  Repair w biologic mesh in pelvis  . Cholecystectomy  12/12/11  . Esophagogastroduodenoscopy N/A 01/10/2013    Procedure: ESOPHAGOGASTRODUODENOSCOPY (EGD);  Surgeon: Meryl Dare, MD;  Location: [facility] ENDOSCOPY;  Service: Endoscopy;  Laterality: N/A;  . Esophagogastroduodenoscopy N/A 02/12/2013    Procedure: ESOPHAGOGASTRODUODENOSCOPY (EGD);  Surgeon: Hilarie Fredrickson, MD;  Location: [facility] ENDOSCOPY;  Service: Endoscopy;  Laterality: N/A;   Social History:   reports that she has never smoked. She has never used smokeless tobacco. She reports that she does not drink alcohol or use illicit drugs.  Family History  Problem Relation Age of Onset  . Bladder Cancer Brother     Medications:   Medication List       This list is accurate as of: 08/06/13 12:22 PM.  Always use your most recent med list.               AMBULATORY NON FORMULARY MEDICATION  O2 @@ 2 LPM via nasal cannula to maintain SATs 89% monitor O2 SATs every shift     fentaNYL 25 MCG/HR patch  Commonly known as:  DURAGESIC - dosed mcg/hr  Place 1 patch (25 mcg total) onto the skin every 3 (three) days.     furosemide 20 MG tablet  Commonly known as:  LASIX  Take 20 mg by mouth daily.     HYDROcodone-acetaminophen  5-500 MG per tablet  Commonly known as:  VICODIN  Take 1 tablet by mouth every 6 (six) hours as needed for pain.     hydrocortisone 2.5 % rectal cream  Commonly known as:  ANUSOL-HC  Place 1 application rectally as needed for hemorrhoids.     mirtazapine 15 MG tablet  Commonly known as:  REMERON  Take 7.5 mg by mouth at bedtime.     nadolol 40 MG tablet  Commonly known as:  CORGARD  Take 1 tablet (40 mg total) by mouth daily.     pantoprazole 40 MG tablet  Commonly known as:  PROTONIX  Take 40 mg by mouth 2 (  two) times daily.     potassium chloride 10 MEQ tablet  Commonly known as:  K-DUR,KLOR-CON  Take 10 mEq by mouth daily.     predniSONE 5 MG tablet  Commonly known as:  DELTASONE  Take 5 mg by mouth daily.     RESOURCE 2.0 Liqd  Take 120 mLs by mouth 2 (two) times daily. Give resource 120 ml by mouth twice daily with Med Pass and add ice cream.     TUCKS 50 % Pads  Apply topically. Apply 1 pad rectally as needed for pain.     witch hazel-glycerin pad  Commonly known as:  TUCKS  Place 1 application rectally as needed for pain.           Physical Exam: Physical Exam  Constitutional: She is oriented to person, place, and time. She appears well-developed and well-nourished. No distress.  HENT:  Head: Normocephalic and atraumatic.  Right Ear: External ear normal.  Left Ear: External ear normal.  Nose: Nose normal.  Mouth/Throat: Oropharynx is clear and moist. No oropharyngeal exudate.  Eyes: Conjunctivae and EOM are normal. Pupils are equal, round, and reactive to light. Right eye exhibits no discharge. Left eye exhibits no discharge.  Neck: Normal range of motion. Neck supple. No JVD present. No thyromegaly present.  Cardiovascular: Normal rate, regular rhythm and intact distal pulses.   Murmur heard.  Systolic murmur is present with a grade of 3/6  Pulmonary/Chest: Tachypnea noted. No respiratory distress. She has no decreased breath sounds. She has no  wheezes. She has rhonchi (corse breath sound diffused). She has rales in the right lower field and the left lower field. She exhibits no tenderness.  Abdominal: Soft. Bowel sounds are normal. She exhibits distension. She exhibits no mass. There is tenderness. There is no rebound and no guarding.  Multiple hemorrhoids.   Genitourinary: Vagina normal. No vaginal discharge found.  Musculoskeletal: Normal range of motion. She exhibits no tenderness. Edema: ankles-improved.  kyphosis  Lymphadenopathy:    She has no cervical adenopathy.  Neurological: She is alert and oriented to person, place, and time. She has normal reflexes. No cranial nerve deficit. She exhibits normal muscle tone. Coordination normal.  Skin: Skin is warm and dry. No rash noted. She is not diaphoretic. There is erythema.  Juandice. Redness in intergluteal fold. Multiple hemorrhoids.   Psychiatric: Judgment normal. Her mood appears not anxious. Her affect is not angry, not blunt, not labile and not inappropriate. Her speech is not rapid and/or pressured, not delayed, not tangential and not slurred. She is not agitated, not aggressive, not hyperactive, not slowed, not withdrawn, not actively hallucinating and not combative. Thought content is not paranoid and not delusional. Cognition and memory are impaired. She does not express impulsivity or inappropriate judgment. She does not exhibit a depressed mood. She exhibits abnormal recent memory. She exhibits normal remote memory.    Filed Vitals:   08/06/13 1135  BP: 120/62  Pulse: 96  Temp: 97 F (36.1 C)  TempSrc: Tympanic  Resp: 20      Labs reviewed: Basic Metabolic Panel:  Recent Labs  95/62/13 2023 01/12/13 0930 01/14/13 0500  02/13/13 04/17/13 05/13/13 07/17/13 07/30/13  NA 140 139 137  < > 139 130* 140  140 140 138  K 5.1 3.5 3.5  < > 4.0 4.2 4.2  4.2 4.1 4.1  CL 108 107 106  --   --   --   --   --   --   CO2  25 28 27   --   --   --   --   --   --   GLUCOSE  133* 104* 110*  --   --   --   --   --   --   BUN 46* 27* 23  < > 17 68* 24*  24* 24* 29*  CREATININE 0.84 0.58 0.53  < > 0.5 0.5 0.5  0.5 0.4* 0.7  CALCIUM 8.8 7.6* 7.7*  --   --   --   --   --   --   TSH  --   --   --   --   --  1.32  --   --   --   < > = values in this interval not displayed. Liver Function Tests:  Recent Labs  01/09/13 2023 01/14/13 0500  05/13/13 07/17/13 07/30/13  AST 36 65*  < > 71*  71* 61* 95*  ALT 28 53*  < > 52*  52* 45* 76*  ALKPHOS 241* 348*  < > 463*  463* 251* 513*  BILITOT 0.9 1.7*  --   --   --   --   PROT 5.0* 4.5*  --   --   --   --   ALBUMIN 2.5* 2.3*  --   --   --   --   < > = values in this interval not displayed.  Recent Labs  01/09/13 2023  LIPASE 43   CBC:  Recent Labs  01/09/13 2023  01/14/13 0500  05/13/13 07/17/13 07/30/13  WBC 7.3  --  7.2  < > 6.3  6.3 7.2 8.9  NEUTROABS 5.7  --   --   --   --   --   --   HGB 6.8*  < > 11.4*  < > 12.1  12.1 11.8* 12.9  HCT 20.0*  < > 34.3*  < > 37 35* 38  MCV 94.3  --  93.7  --   --   --   --   PLT 143*  --  84*  < > 167 153 149*  < > = values in this interval not displayed.  Past Procedures:  02/06/13 [place] abd: free intraperitoneal fluid/ascites is seen in all four quadrants of the abdomen to a moderate to large extent.   07/17/13 [place] abd: small amount of ascites seen at the right upper quadrant and right lower quadrant. Small right pleural effusion.    Assessment/Plan Ascites 02/06/13 [place] abd showed free intraperitoneal fluid/ascites is seen in all four quadrants of the abdomen to a moderate to large extent, bilateral pleural effusion are noted as well. 07/17/13 [place] abd: small amount of ascites seen at the right upper quadrant and right lower quadrant. Small right pleural effusion. Decreased Furosemide to 20mg  daily. This is mainly her cause of distended abd-she has BM regularly.                 Internal hemorrhoids with prolapse No bleeding or s/s of infection--TUCK prn  available to her. Also will apply Mycolog II cream bid to ease irritation in the area.   Chronic cholecystitis with calculus Jaundice-elevated bilirubin 7 07/30/13--contribute to her N/V--Zofran helped.   Diaper candidiasis Intergluteal redness--Mycolog II cream bid   Polymyalgia rheumatica Continue with Prednisone 5mg  by month. Multiple sites pain--controlled on Fentanyl 77mcg/hr                      Esophagitis, erosive  Continue PPI      Esophageal varices with hemorrhage F/u GI, takes Nadolol, s/p bands          Depression Sad facial looks, weight loss, poor appetite--no better sinceRemeron 15mg  po daily-down to 7.5mg  now. My opinion her debility is the culprit.               Family/ Staff Communication: observe the patient. Comfort Care  Goals of Care: SNF. Hospice Service.   Labs/tests ordered: none

## 2013-08-06 NOTE — Assessment & Plan Note (Signed)
Continue with Prednisone 5mg  by month. Multiple sites pain--controlled on Fentanyl 33mcg/hr

## 2013-08-06 NOTE — Assessment & Plan Note (Signed)
Intergluteal redness--Mycolog II cream bid

## 2013-08-06 NOTE — Assessment & Plan Note (Signed)
Sad facial looks, weight loss, poor appetite--no better sinceRemeron 15mg po daily-down to 7.5mg now. My opinion her debility is the culprit.          

## 2013-08-06 NOTE — Assessment & Plan Note (Signed)
No bleeding or s/s of infection--TUCK prn available to her. Also will apply Mycolog II cream bid to ease irritation in the area.

## 2013-08-25 ENCOUNTER — Non-Acute Institutional Stay (SKILLED_NURSING_FACILITY): Payer: Medicare Other | Admitting: Nurse Practitioner

## 2013-08-25 ENCOUNTER — Encounter: Payer: Self-pay | Admitting: Nurse Practitioner

## 2013-08-25 DIAGNOSIS — R188 Other ascites: Secondary | ICD-10-CM

## 2013-08-25 DIAGNOSIS — I4891 Unspecified atrial fibrillation: Secondary | ICD-10-CM

## 2013-08-25 DIAGNOSIS — R17 Unspecified jaundice: Secondary | ICD-10-CM | POA: Insufficient documentation

## 2013-08-25 DIAGNOSIS — K7460 Unspecified cirrhosis of liver: Secondary | ICD-10-CM | POA: Insufficient documentation

## 2013-08-25 DIAGNOSIS — F329 Major depressive disorder, single episode, unspecified: Secondary | ICD-10-CM

## 2013-08-25 DIAGNOSIS — I8501 Esophageal varices with bleeding: Secondary | ICD-10-CM

## 2013-08-25 DIAGNOSIS — M353 Polymyalgia rheumatica: Secondary | ICD-10-CM

## 2013-08-25 NOTE — Assessment & Plan Note (Signed)
Dc Remeron 7.5mg -no efficacy and no longer beneficial.

## 2013-08-25 NOTE — Assessment & Plan Note (Signed)
s/p bands, dc Nadolol-difficulty with oral intake and comfort is her goal of care.

## 2013-08-25 NOTE — Assessment & Plan Note (Signed)
Apparent due to chronic cholelithiasis with dilated bile duct--total bilirubin 7.0 07/30/13.

## 2013-08-25 NOTE — Assessment & Plan Note (Addendum)
Hx of Portal HTN, esophageal varices and bleeding, ascites-untreatable--should be the cause of adult FTT. Comfort measures with Zofran, Ativan, and Fentanyl

## 2013-08-25 NOTE — Assessment & Plan Note (Signed)
02/06/13 [place] abd showed free intraperitoneal fluid/ascites is seen in all four quadrants of the abdomen to a moderate to large extent, bilateral pleural effusion are noted as well. 07/17/13 [place] abd: small amount of ascites seen at the right upper quadrant and right lower quadrant. Small right pleural effusion. Dc Furosemide due to near zero oral intake presently and abd is not as distended as prior.

## 2013-08-25 NOTE — Assessment & Plan Note (Signed)
Still rate controlled.  

## 2013-08-25 NOTE — Assessment & Plan Note (Signed)
Pain is managed with Fentanyl transdermal patch-dc'd Prednisone due to severe N/V

## 2013-08-25 NOTE — Progress Notes (Signed)
Patient ID: Tammy Myers, female   DOB: 07-04-1921, 79 y.o.   MRN: [id number]  Code Status: DNR  Allergies  Allergen Reactions  . Caffeine   . Fosamax [Alendronate Sodium]   . Sudafed [Pseudoephedrine Hcl]     Chief Complaint  Patient presents with  . Medical Managment of Chronic Issues    end of life care.     HPI: Patient is a 79 y.o. female seen in the SNF at [facility] today for evaluation of jaundice, cirrhosis,  Ascites/distended abd,  and chronic medical conditions.      Problem List Items Addressed This Visit   Ascites (Chronic)     02/06/13 [place] abd showed free intraperitoneal fluid/ascites is seen in all four quadrants of the abdomen to a moderate to large extent, bilateral pleural effusion are noted as well. 07/17/13 [place] abd: small amount of ascites seen at the right upper quadrant and right lower quadrant. Small right pleural effusion. Dc Furosemide due to near zero oral intake presently and abd is not as distended as prior.                    ATRIAL FIBRILLATION, PAROXYSMAL     Still rate controlled.       Cirrhosis of liver     Hx of Portal HTN, esophageal varices and bleeding, ascites-untreatable--should be the cause of adult FTT. Comfort measures with Zofran, Ativan, and Fentanyl    Depression     Dc Remeron 7.5mg -no efficacy and no longer beneficial.                 Relevant Medications      LORazepam (ATIVAN) 0.5 MG tablet   Esophageal varices with hemorrhage      s/p bands, dc Nadolol-difficulty with oral intake and comfort is her goal of care.               Jaundice     Apparent due to chronic cholelithiasis with dilated bile duct--total bilirubin 7.0 07/30/13.    Polymyalgia rheumatica - Primary     Pain is managed with Fentanyl transdermal patch-dc'd Prednisone due to severe N/V       Review of Systems:  Review of Systems  Constitutional: Positive for weight loss and malaise/fatigue. Negative for  fever, chills and diaphoresis.  HENT: Positive for hearing loss. Negative for congestion, ear pain, nosebleeds and sore throat.   Eyes: Negative for pain, discharge and redness.  Respiratory: Positive for cough (hacking) and shortness of breath. Negative for sputum production and wheezing.   Cardiovascular: Positive for PND. Negative for chest pain, palpitations, orthopnea, claudication and leg swelling.  Gastrointestinal: Positive for nausea, vomiting and abdominal pain. Negative for heartburn, constipation and blood in stool.       Ascites-feels tight/distended abd. Multiple hemorrhoids-not infected or bleeding.   Genitourinary: Positive for frequency. Negative for dysuria, urgency, hematuria and flank pain.  Musculoskeletal: Positive for back pain, joint pain and myalgias. Negative for neck pain.  Skin: Negative for itching and rash.       Juandice. Intergluteal fold redness.   Neurological: Positive for weakness. Negative for dizziness, tingling, tremors, sensory change, speech change, focal weakness, seizures, loss of consciousness and headaches.  Endo/Heme/Allergies: Negative for environmental allergies and polydipsia. Bruises/bleeds easily.  Psychiatric/Behavioral: Positive for depression and memory loss. Negative for hallucinations. The patient is not nervous/anxious and does not have insomnia.      Past Medical History  Diagnosis Date  . PMR (polymyalgia rheumatica)   .  Temporal arteritis   . Osteoporosis   . Hearing loss   . Wears glasses   . Rectal bleeding   . Arthritis   . Rectal prolapse   . Hypertension   . Mitral valve prolapse   . Spinal stenosis   . Alzheimer's dementia   . Hyperlipidemia   . GERD (gastroesophageal reflux disease)   . Sciatica   . Scoliosis   . Esophageal varices   . Hiatal hernia   . Atrial fibrillation   . Unspecified constipation   . Other specified circulatory system disorders   . Internal hemorrhoids without mention of complication   .  External hemorrhoids without mention of complication   . Sciatica   . Pain in joint, ankle and foot   . Abdominal pain, unspecified site    Past Surgical History  Procedure Laterality Date  . Abdominal hysterectomy    . Appendectomy    . Colonoscopy    . Breast surgery      left  . Hernia repair      [facility]  . Esophagogastroduodenoscopy  11/02/2011    Procedure: ESOPHAGOGASTRODUODENOSCOPY (EGD);  Surgeon: Yancey Flemings, MD;  Location: [facility] ENDOSCOPY;  Service: Endoscopy;  Laterality: N/A;  . Rectal prolapse repair, rectopexy  11/16/2010    with sigmoid colectomy.  Repair w biologic mesh in pelvis  . Cholecystectomy  12/12/11  . Esophagogastroduodenoscopy N/A 01/10/2013    Procedure: ESOPHAGOGASTRODUODENOSCOPY (EGD);  Surgeon: Meryl Dare, MD;  Location: [facility] ENDOSCOPY;  Service: Endoscopy;  Laterality: N/A;  . Esophagogastroduodenoscopy N/A 02/12/2013    Procedure: ESOPHAGOGASTRODUODENOSCOPY (EGD);  Surgeon: Hilarie Fredrickson, MD;  Location: [facility] ENDOSCOPY;  Service: Endoscopy;  Laterality: N/A;   Social History:   reports that she has never smoked. She has never used smokeless tobacco. She reports that she does not drink alcohol or use illicit drugs.  Family History  Problem Relation Age of Onset  . Bladder Cancer Brother     Medications:   Medication List       This list is accurate as of: 08/25/13  5:33 PM.  Always use your most recent med list.               AMBULATORY NON FORMULARY MEDICATION  O2 @@ 2 LPM via nasal cannula to maintain SATs 89% monitor O2 SATs every shift     fentaNYL 25 MCG/HR patch  Commonly known as:  DURAGESIC - dosed mcg/hr  Place 1.5 patches onto the skin every 3 (three) days.     HYDROcodone-acetaminophen 5-500 MG per tablet  Commonly known as:  VICODIN  Take 1 tablet by mouth every 6 (six) hours as needed for pain.     hydrocortisone 2.5 % rectal cream  Commonly known as:  ANUSOL-HC  Place 1 application rectally as needed for hemorrhoids.     LORazepam  0.5 MG tablet  Commonly known as:  ATIVAN  Take 0.5 mg by mouth every 2 (two) hours as needed for anxiety.     ondansetron 4 MG disintegrating tablet  Commonly known as:  ZOFRAN-ODT  Take 4 mg by mouth every 6 (six) hours.     TUCKS 50 % Pads  Apply topically. Apply 1 pad rectally as needed for pain.     witch hazel-glycerin pad  Commonly known as:  TUCKS  Place 1 application rectally as needed for pain.           Physical Exam: Physical Exam  Constitutional: She is oriented to person, place, and time.  She appears well-developed and well-nourished. No distress.  HENT:  Head: Normocephalic and atraumatic.  Right Ear: External ear normal.  Left Ear: External ear normal.  Nose: Nose normal.  Mouth/Throat: Oropharynx is clear and moist. No oropharyngeal exudate.  Eyes: Conjunctivae and EOM are normal. Pupils are equal, round, and reactive to light. Right eye exhibits no discharge. Left eye exhibits no discharge.  Neck: Normal range of motion. Neck supple. No JVD present. No thyromegaly present.  Cardiovascular: Normal rate, regular rhythm and intact distal pulses.   Murmur heard.  Systolic murmur is present with a grade of 3/6  Pulmonary/Chest: Tachypnea noted. No respiratory distress. She has no decreased breath sounds. She has no wheezes. She has rhonchi (corse breath sound diffused). She has rales in the right lower field and the left lower field. She exhibits no tenderness.  Abdominal: Soft. Bowel sounds are normal. She exhibits distension. She exhibits no mass. There is tenderness. There is no rebound and no guarding.  Multiple hemorrhoids.   Genitourinary: Vagina normal. No vaginal discharge found.  Musculoskeletal: Normal range of motion. She exhibits no tenderness. Edema: ankles-improved.  kyphosis  Lymphadenopathy:    She has no cervical adenopathy.  Neurological: She is alert and oriented to person, place, and time. She has normal reflexes. No cranial nerve deficit.  She exhibits normal muscle tone. Coordination normal.  Skin: Skin is warm and dry. No rash noted. She is not diaphoretic. There is erythema.  Juandice. Redness in intergluteal fold. Multiple hemorrhoids.   Psychiatric: Judgment normal. Her mood appears not anxious. Her affect is not angry, not blunt, not labile and not inappropriate. Her speech is not rapid and/or pressured, not delayed, not tangential and not slurred. She is not agitated, not aggressive, not hyperactive, not slowed, not withdrawn, not actively hallucinating and not combative. Thought content is not paranoid and not delusional. Cognition and memory are impaired. She does not express impulsivity or inappropriate judgment. She does not exhibit a depressed mood. She exhibits abnormal recent memory. She exhibits normal remote memory.    Filed Vitals:   08/25/13 1710  BP: 130/60  Pulse: 70  Temp: 97 F (36.1 C)  TempSrc: Tympanic  Resp: 18      Labs reviewed: Basic Metabolic Panel:  Recent Labs  45/40/98 2023 01/12/13 0930 01/14/13 0500  02/13/13 04/17/13 05/13/13 07/17/13 07/30/13  NA 140 139 137  < > 139 130* 140  140 140 138  K 5.1 3.5 3.5  < > 4.0 4.2 4.2  4.2 4.1 4.1  CL 108 107 106  --   --   --   --   --   --   CO2 25 28 27   --   --   --   --   --   --   GLUCOSE 133* 104* 110*  --   --   --   --   --   --   BUN 46* 27* 23  < > 17 68* 24*  24* 24* 29*  CREATININE 0.84 0.58 0.53  < > 0.5 0.5 0.5  0.5 0.4* 0.7  CALCIUM 8.8 7.6* 7.7*  --   --   --   --   --   --   TSH  --   --   --   --   --  1.32  --   --   --   < > = values in this interval not displayed. Liver Function Tests:  Recent Labs  01/09/13 2023  01/14/13 0500  05/13/13 07/17/13 07/30/13  AST 36 65*  < > 71*  71* 61* 95*  ALT 28 53*  < > 52*  52* 45* 76*  ALKPHOS 241* 348*  < > 463*  463* 251* 513*  BILITOT 0.9 1.7*  --   --   --   --   PROT 5.0* 4.5*  --   --   --   --   ALBUMIN 2.5* 2.3*  --   --   --   --   < > = values in this  interval not displayed.  Recent Labs  01/09/13 2023  LIPASE 43   CBC:  Recent Labs  01/09/13 2023  01/14/13 0500  05/13/13 07/17/13 07/30/13  WBC 7.3  --  7.2  < > 6.3  6.3 7.2 8.9  NEUTROABS 5.7  --   --   --   --   --   --   HGB 6.8*  < > 11.4*  < > 12.1  12.1 11.8* 12.9  HCT 20.0*  < > 34.3*  < > 37 35* 38  MCV 94.3  --  93.7  --   --   --   --   PLT 143*  --  84*  < > 167 153 149*  < > = values in this interval not displayed.  Past Procedures:  02/06/13 [place] abd: free intraperitoneal fluid/ascites is seen in all four quadrants of the abdomen to a moderate to large extent.   07/17/13 [place] abd: small amount of ascites seen at the right upper quadrant and right lower quadrant. Small right pleural effusion.    Assessment/Plan Polymyalgia rheumatica Pain is managed with Fentanyl transdermal patch-dc'd Prednisone due to severe N/V  Esophageal varices with hemorrhage  s/p bands, dc Nadolol-difficulty with oral intake and comfort is her goal of care.             Ascites 02/06/13 [place] abd showed free intraperitoneal fluid/ascites is seen in all four quadrants of the abdomen to a moderate to large extent, bilateral pleural effusion are noted as well. 07/17/13 [place] abd: small amount of ascites seen at the right upper quadrant and right lower quadrant. Small right pleural effusion. Dc Furosemide due to near zero oral intake presently and abd is not as distended as prior.                  ATRIAL FIBRILLATION, PAROXYSMAL Still rate controlled.     Depression Dc Remeron 7.5mg -no efficacy and no longer beneficial.               Jaundice Apparent due to chronic cholelithiasis with dilated bile duct--total bilirubin 7.0 07/30/13.  Cirrhosis of liver Hx of Portal HTN, esophageal varices and bleeding, ascites-untreatable--should be the cause of adult FTT. Comfort measures with Zofran, Ativan, and Fentanyl    Family/ Staff Communication: observe the  patient. Comfort Care  Goals of Care: SNF. Hospice Service.   Labs/tests ordered: none

## 2013-09-03 ENCOUNTER — Encounter: Payer: Self-pay | Admitting: Nurse Practitioner

## 2013-09-03 ENCOUNTER — Non-Acute Institutional Stay (SKILLED_NURSING_FACILITY): Payer: Medicare Other | Admitting: Nurse Practitioner

## 2013-09-03 DIAGNOSIS — R0902 Hypoxemia: Secondary | ICD-10-CM

## 2013-09-03 DIAGNOSIS — R627 Adult failure to thrive: Secondary | ICD-10-CM

## 2013-09-03 DIAGNOSIS — M353 Polymyalgia rheumatica: Secondary | ICD-10-CM

## 2013-09-03 DIAGNOSIS — I509 Heart failure, unspecified: Secondary | ICD-10-CM

## 2013-09-03 DIAGNOSIS — K7460 Unspecified cirrhosis of liver: Secondary | ICD-10-CM

## 2013-09-03 DIAGNOSIS — R188 Other ascites: Secondary | ICD-10-CM

## 2013-09-08 NOTE — Assessment & Plan Note (Signed)
Irreversible: lethargic, very little oral intake, nausea, jaundice, pain, O2 desaturation, and restlessness. Ativan and Morphine for comfort care

## 2013-09-08 NOTE — Assessment & Plan Note (Signed)
Persisted.  

## 2013-09-08 NOTE — Assessment & Plan Note (Signed)
Pain is managed with Fentanyl transdermal patch and prn Morphine 5mg  q2h prn.

## 2013-09-08 NOTE — Assessment & Plan Note (Signed)
Moist rales presented posterior mid/lower lungs.

## 2013-09-08 NOTE — Progress Notes (Signed)
Patient ID: Tammy Myers, female   DOB: Jan 06, 1921, 79 y.o.   MRN: [id number]   Code Status: DNR  Allergies  Allergen Reactions  . Caffeine   . Fosamax [Alendronate Sodium]   . Sudafed [Pseudoephedrine Hcl]     Chief Complaint  Patient presents with  . Acute Visit    end of life care.     HPI: Patient is a 79 y.o. female seen in the SNF at [facility] today for managing end of life care issues.  Problem List Items Addressed This Visit   Ascites (Chronic)     No change in abd girth since the patient has very little oral intake.     CHF (congestive heart failure)     Moist rales presented posterior mid/lower lungs.     Cirrhosis of liver - Primary     Hx of Portal HTN, esophageal varices and bleeding, ascites-untreatable--should be the cause of adult FTT. Comfort measures with Zofran, Ativan, Morphine, and Fentanyl. The patient is jaundice with altered LFT.       Failure to thrive in adult     Irreversible: lethargic, very little oral intake, nausea, jaundice, pain, O2 desaturation, and restlessness. Ativan and Morphine for comfort care    Hypoxemia     Persisted.     Polymyalgia rheumatica     Pain is managed with Fentanyl transdermal patch and prn Morphine 5mg  q2h prn.        Review of Systems:  Review of Systems  Constitutional: Positive for weight loss and malaise/fatigue. Negative for fever, chills and diaphoresis.       Very lethargic, only responded to tactile stimuli with eyes open briefly  HENT: Positive for hearing loss. Negative for congestion, ear pain, nosebleeds and sore throat.   Eyes: Negative for pain, discharge and redness.  Respiratory: Positive for cough (hacking) and shortness of breath. Negative for sputum production and wheezing.   Cardiovascular: Positive for PND. Negative for chest pain, palpitations, orthopnea, claudication and leg swelling.  Gastrointestinal: Positive for nausea, vomiting and abdominal pain. Negative for  heartburn, constipation and blood in stool.       Ascites-feels tight/distended abd. Multiple hemorrhoids-not infected or bleeding.   Genitourinary: Positive for frequency. Negative for dysuria, urgency, hematuria and flank pain.  Musculoskeletal: Positive for back pain, joint pain and myalgias. Negative for neck pain.  Skin: Negative for itching and rash.       Juandice. Intergluteal fold redness.   Neurological: Positive for weakness. Negative for dizziness, tingling, tremors, sensory change, speech change, focal weakness, seizures, loss of consciousness and headaches.  Endo/Heme/Allergies: Negative for environmental allergies and polydipsia. Bruises/bleeds easily.  Psychiatric/Behavioral: Positive for depression and memory loss. Negative for hallucinations. The patient is not nervous/anxious and does not have insomnia.      Past Medical History  Diagnosis Date  . PMR (polymyalgia rheumatica)   . Temporal arteritis   . Osteoporosis   . Hearing loss   . Wears glasses   . Rectal bleeding   . Arthritis   . Rectal prolapse   . Hypertension   . Mitral valve prolapse   . Spinal stenosis   . Alzheimer's dementia   . Hyperlipidemia   . GERD (gastroesophageal reflux disease)   . Sciatica   . Scoliosis   . Esophageal varices   . Hiatal hernia   . Atrial fibrillation   . Unspecified constipation   . Other specified circulatory system disorders   . Internal hemorrhoids without mention of  complication   . External hemorrhoids without mention of complication   . Sciatica   . Pain in joint, ankle and foot   . Abdominal pain, unspecified site    Past Surgical History  Procedure Laterality Date  . Abdominal hysterectomy    . Appendectomy    . Colonoscopy    . Breast surgery      left  . Hernia repair      [facility]  . Esophagogastroduodenoscopy  11/02/2011    Procedure: ESOPHAGOGASTRODUODENOSCOPY (EGD);  Surgeon: Yancey Flemings, MD;  Location: [facility] ENDOSCOPY;  Service: Endoscopy;  Laterality:  N/A;  . Rectal prolapse repair, rectopexy  11/16/2010    with sigmoid colectomy.  Repair w biologic mesh in pelvis  . Cholecystectomy  12/12/11  . Esophagogastroduodenoscopy N/A 01/10/2013    Procedure: ESOPHAGOGASTRODUODENOSCOPY (EGD);  Surgeon: Meryl Dare, MD;  Location: [facility] ENDOSCOPY;  Service: Endoscopy;  Laterality: N/A;  . Esophagogastroduodenoscopy N/A 02/12/2013    Procedure: ESOPHAGOGASTRODUODENOSCOPY (EGD);  Surgeon: Hilarie Fredrickson, MD;  Location: [facility] ENDOSCOPY;  Service: Endoscopy;  Laterality: N/A;   Social History:   reports that she has never smoked. She has never used smokeless tobacco. She reports that she does not drink alcohol or use illicit drugs.  Family History  Problem Relation Age of Onset  . Bladder Cancer Brother     Medications: Patient's Medications  New Prescriptions   No medications on file  Previous Medications   ACETAMINOPHEN (TYLENOL) 650 MG SUPPOSITORY    Place 650 mg rectally every 4 (four) hours as needed.   AMBULATORY NON FORMULARY MEDICATION    O2 @@ 2 LPM via nasal cannula to maintain SATs 89% monitor O2 SATs every shift   FENTANYL (DURAGESIC - DOSED MCG/HR) 25 MCG/HR PATCH    Place 1.5 patches onto the skin every 3 (three) days.   HYDROCORTISONE (ANUSOL-HC) 2.5 % RECTAL CREAM    Place 1 application rectally as needed for hemorrhoids.   LORAZEPAM (ATIVAN) 0.5 MG TABLET    Take 0.5 mg by mouth every 2 (two) hours as needed for anxiety.   MORPHINE (ROXANOL) 20 MG/ML CONCENTRATED SOLUTION    Take 5 mg by mouth every 2 (two) hours as needed for severe pain.   ONDANSETRON (ZOFRAN-ODT) 4 MG DISINTEGRATING TABLET    Take 4 mg by mouth every 6 (six) hours.   WITCH HAZEL (TUCKS) 50 % PADS    Apply topically. Apply 1 pad rectally as needed for pain.   WITCH HAZEL-GLYCERIN (TUCKS) PAD    Place 1 application rectally as needed for pain.  Modified Medications   No medications on file  Discontinued Medications   HYDROCODONE-ACETAMINOPHEN (VICODIN) 5-500 MG PER  TABLET    Take 1 tablet by mouth every 6 (six) hours as needed for pain.     Physical Exam: Physical Exam  Constitutional: She is oriented to person, place, and time. She appears well-developed and well-nourished. No distress.  Lethargic and non verbal.   HENT:  Head: Normocephalic and atraumatic.  Right Ear: External ear normal.  Left Ear: External ear normal.  Nose: Nose normal.  Mouth/Throat: Oropharynx is clear and moist. No oropharyngeal exudate.  Eyes: Conjunctivae and EOM are normal. Pupils are equal, round, and reactive to light. Right eye exhibits no discharge. Left eye exhibits no discharge.  Neck: Normal range of motion. Neck supple. No JVD present. No thyromegaly present.  Cardiovascular: Normal rate, regular rhythm and intact distal pulses.   Murmur heard.  Systolic murmur is present with a  grade of 3/6  Pulmonary/Chest: Tachypnea noted. No respiratory distress. She has no decreased breath sounds. She has no wheezes. She has rhonchi (corse breath sound diffused). She has rales in the right lower field and the left lower field. She exhibits no tenderness.  Abdominal: Soft. Bowel sounds are normal. She exhibits distension. She exhibits no mass. There is tenderness. There is no rebound and no guarding.  Multiple hemorrhoids.   Genitourinary: Vagina normal. No vaginal discharge found.  Musculoskeletal: Normal range of motion. She exhibits no tenderness. Edema: ankles-improved.  kyphosis  Lymphadenopathy:    She has no cervical adenopathy.  Neurological: She is alert and oriented to person, place, and time. She has normal reflexes. No cranial nerve deficit. She exhibits normal muscle tone. Coordination normal.  Skin: Skin is warm and dry. No rash noted. She is not diaphoretic. There is erythema.  Juandice. Redness in intergluteal fold. Multiple hemorrhoids.   Psychiatric: Judgment normal. Her mood appears not anxious. Her affect is not angry, not blunt, not labile and not  inappropriate. Her speech is not rapid and/or pressured, not delayed, not tangential and not slurred. She is not agitated, not aggressive, not hyperactive, not slowed, not withdrawn, not actively hallucinating and not combative. Thought content is not paranoid and not delusional. Cognition and memory are impaired. She does not express impulsivity or inappropriate judgment. She does not exhibit a depressed mood. She exhibits abnormal recent memory. She exhibits normal remote memory.    Filed Vitals:   Sep 28, 2013 0930  BP: 117/72  Pulse: 87  Temp: 98 F (36.7 C)  TempSrc: Tympanic  Resp: 28      Labs reviewed: Basic Metabolic Panel:  Recent Labs  82/95/62 2023 01/12/13 0930 01/14/13 0500  02/13/13 04/17/13 05/13/13 07/17/13 07/30/13  NA 140 139 137  < > 139 130* 140  140 140 138  K 5.1 3.5 3.5  < > 4.0 4.2 4.2  4.2 4.1 4.1  CL 108 107 106  --   --   --   --   --   --   CO2 25 28 27   --   --   --   --   --   --   GLUCOSE 133* 104* 110*  --   --   --   --   --   --   BUN 46* 27* 23  < > 17 68* 24*  24* 24* 29*  CREATININE 0.84 0.58 0.53  < > 0.5 0.5 0.5  0.5 0.4* 0.7  CALCIUM 8.8 7.6* 7.7*  --   --   --   --   --   --   TSH  --   --   --   --   --  1.32  --   --   --   < > = values in this interval not displayed. Liver Function Tests:  Recent Labs  01/09/13 2023 01/14/13 0500  05/13/13 07/17/13 07/30/13  AST 36 65*  < > 71*  71* 61* 95*  ALT 28 53*  < > 52*  52* 45* 76*  ALKPHOS 241* 348*  < > 463*  463* 251* 513*  BILITOT 0.9 1.7*  --   --   --   --   PROT 5.0* 4.5*  --   --   --   --   ALBUMIN 2.5* 2.3*  --   --   --   --   < > = values in this interval not displayed.  Recent Labs  01/09/13 2023  LIPASE 43   No results found for this basename: AMMONIA,  in the last 8760 hours CBC:  Recent Labs  01/09/13 2023  01/14/13 0500  05/13/13 07/17/13 07/30/13  WBC 7.3  --  7.2  < > 6.3  6.3 7.2 8.9  NEUTROABS 5.7  --   --   --   --   --   --   HGB 6.8*  < >  11.4*  < > 12.1  12.1 11.8* 12.9  HCT 20.0*  < > 34.3*  < > 37 35* 38  MCV 94.3  --  93.7  --   --   --   --   PLT 143*  --  84*  < > 167 153 149*  < > = values in this interval not displayed.  Assessment/Plan Cirrhosis of liver Hx of Portal HTN, esophageal varices and bleeding, ascites-untreatable--should be the cause of adult FTT. Comfort measures with Zofran, Ativan, Morphine, and Fentanyl. The patient is jaundice with altered LFT.     Ascites No change in abd girth since the patient has very little oral intake.   CHF (congestive heart failure) Moist rales presented posterior mid/lower lungs.   Polymyalgia rheumatica Pain is managed with Fentanyl transdermal patch and prn Morphine 5mg  q2h prn.   Hypoxemia Persisted.   Failure to thrive in adult Irreversible: lethargic, very little oral intake, nausea, jaundice, pain, O2 desaturation, and restlessness. Ativan and Morphine for comfort care    Family/ Staff Communication: observe the patient  Goals of Care: SNF Hospice.   Labs/tests ordered: none

## 2013-09-08 NOTE — Assessment & Plan Note (Signed)
Hx of Portal HTN, esophageal varices and bleeding, ascites-untreatable--should be the cause of adult FTT. Comfort measures with Zofran, Ativan, Morphine, and Fentanyl. The patient is jaundice with altered LFT.

## 2013-09-08 NOTE — Assessment & Plan Note (Signed)
No change in abd girth since the patient has very little oral intake.

## 2013-09-08 DEATH — deceased

## 2013-11-10 IMAGING — CR DG FOOT COMPLETE 3+V*L*
3 series · 3 of 3 positions shown · non-contrast
Comparison: None.

CLINICAL DATA: Pain and bruising and swelling, no injury

LEFT FOOT - COMPLETE 3+ VIEW

[view not recorded (1 of 3)]
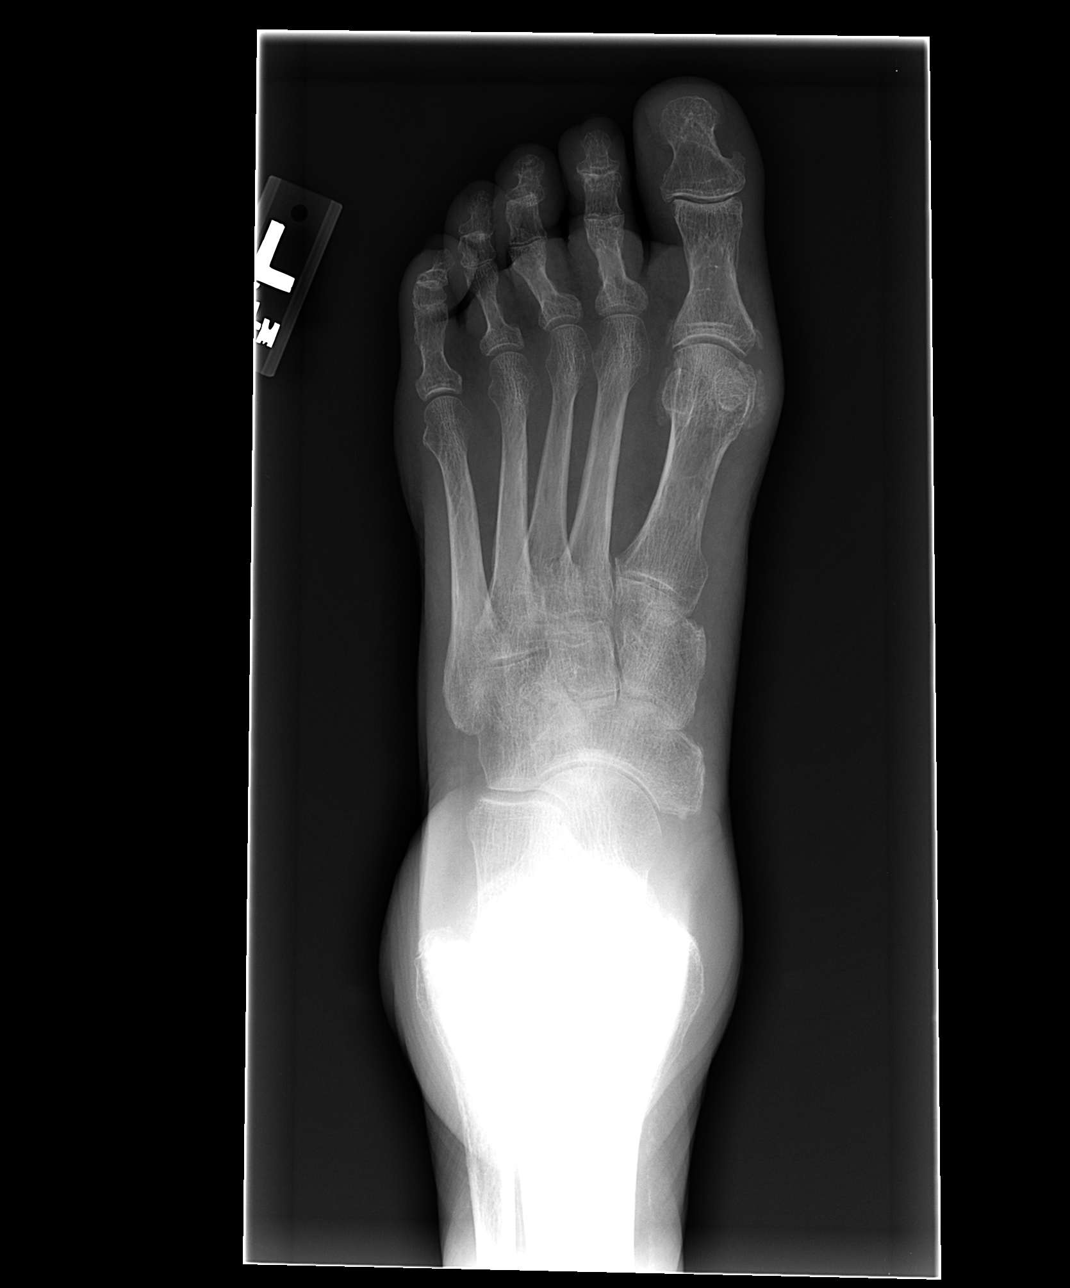

[view not recorded (2 of 3)]
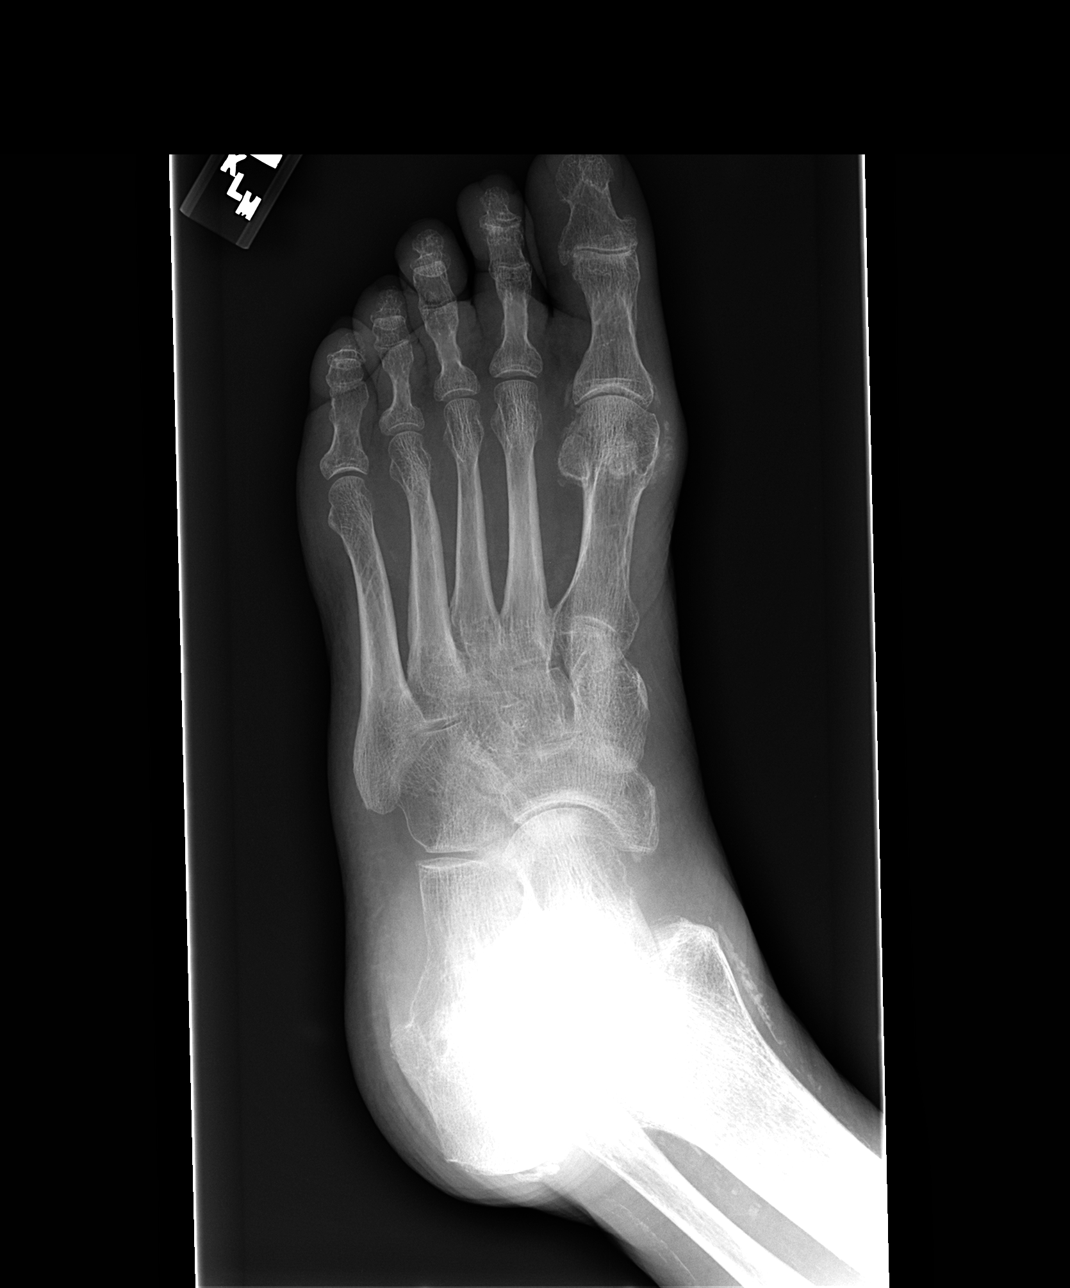

[view not recorded (3 of 3)]
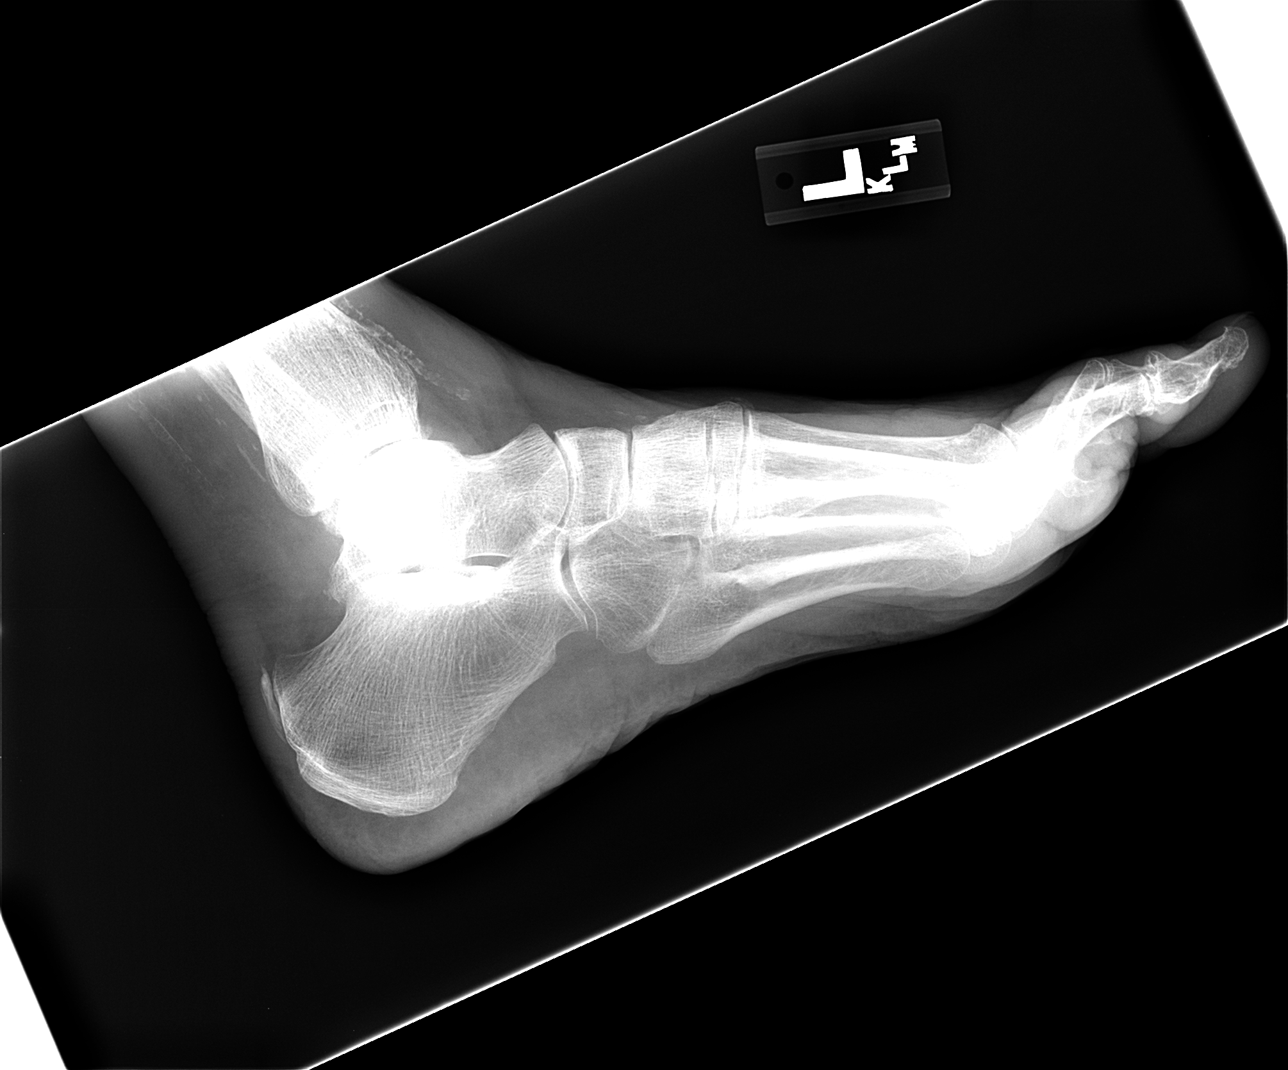

[3 of 3 positions shown; findings below may reference images not displayed]

FINDINGS: Tarsal - metatarsal alignment is normal.  No fracture is
seen.  There is mild degenerative change of the left first MTP and
DIP joint spaces.  There is a periarticular calcification of the
left first MTP joint with some soft tissue swelling.  This is
nonspecific but can be seen with gout or connective tissue disease.
In addition there is some soft tissue calcification at the
insertion of the Achilles tendon.  Arterial calcification also is
noted.  The bones are diffusely osteopenic.
IMPRESSION: 1.  No acute fracture.
2.  Periarticular calcification of the left first MTP joint.
Question arthritis such as gout or possibly connective tissue
disease.

## 2013-11-10 IMAGING — CR DG ANKLE COMPLETE 3+V*L*
3 series · 3 of 3 positions shown · non-contrast
Comparison: None.

CLINICAL DATA: Pain bruising and swelling, no acute abnormality

LEFT ANKLE COMPLETE - 3+ VIEW

[view not recorded (1 of 3)]
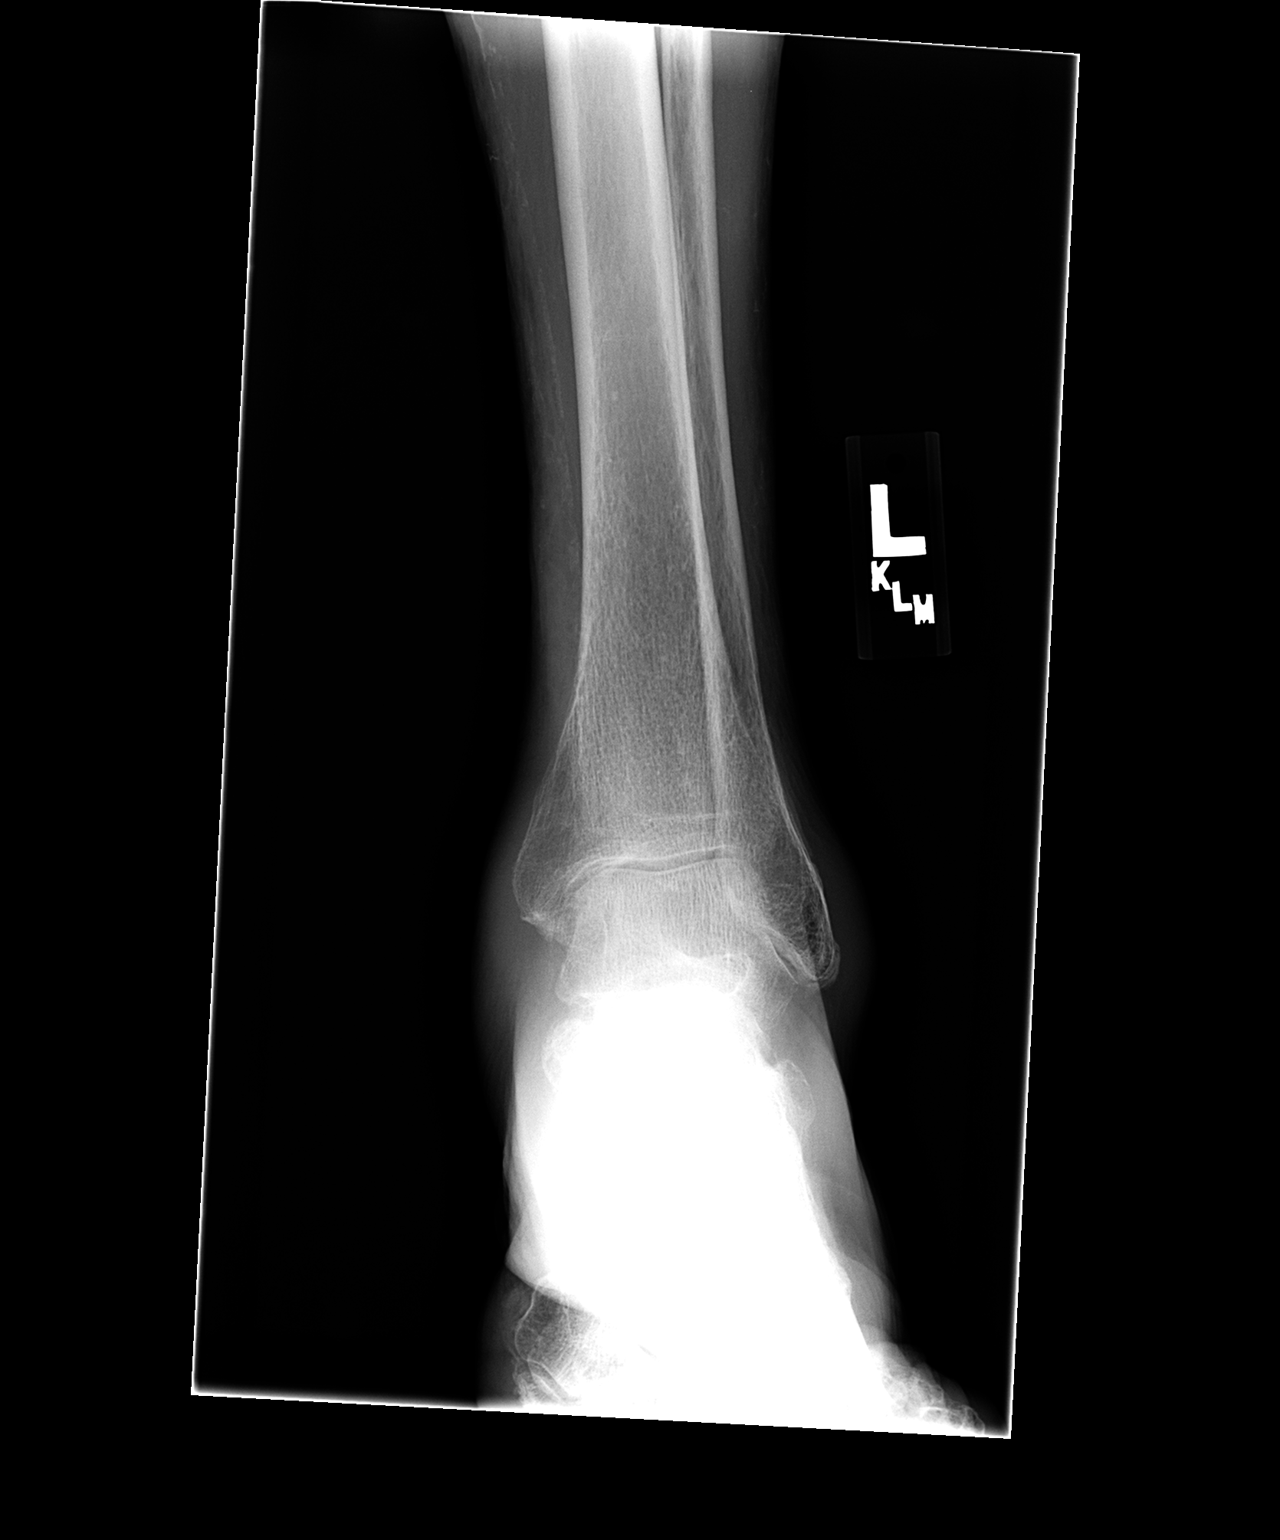

[view not recorded (2 of 3)]
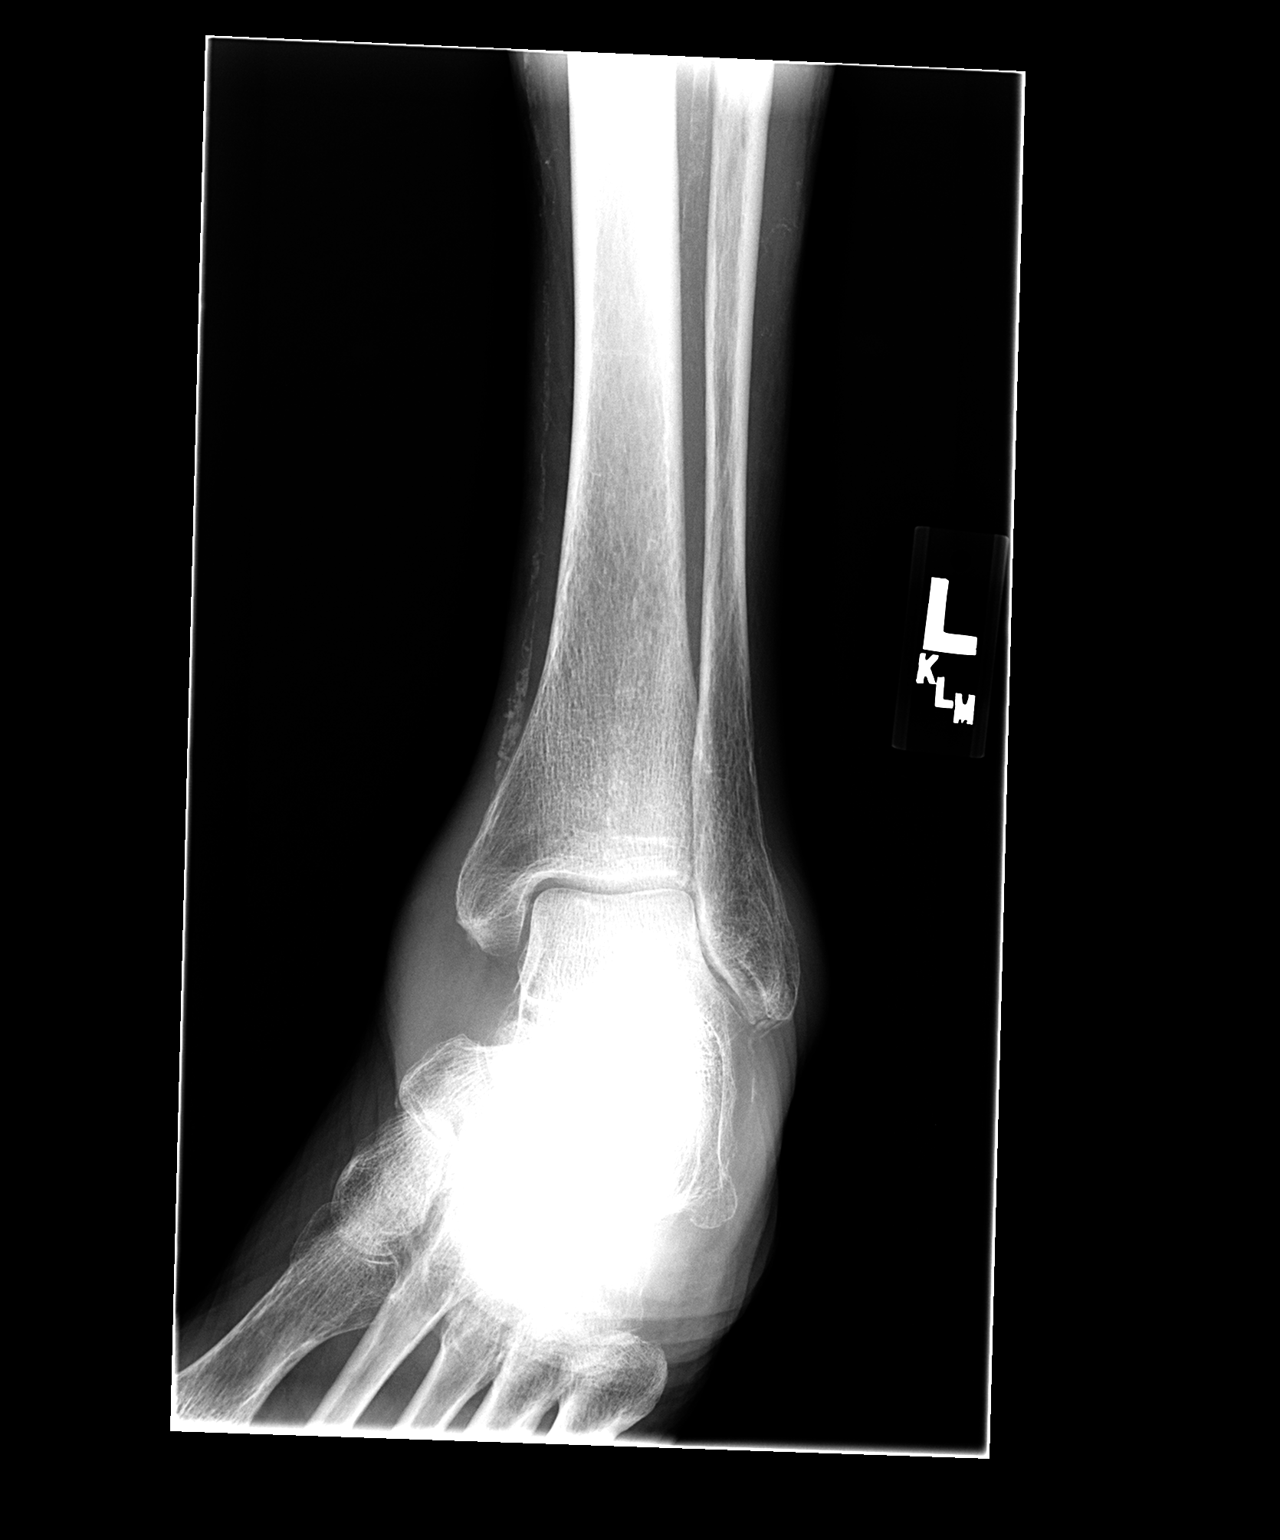

[view not recorded (3 of 3)]
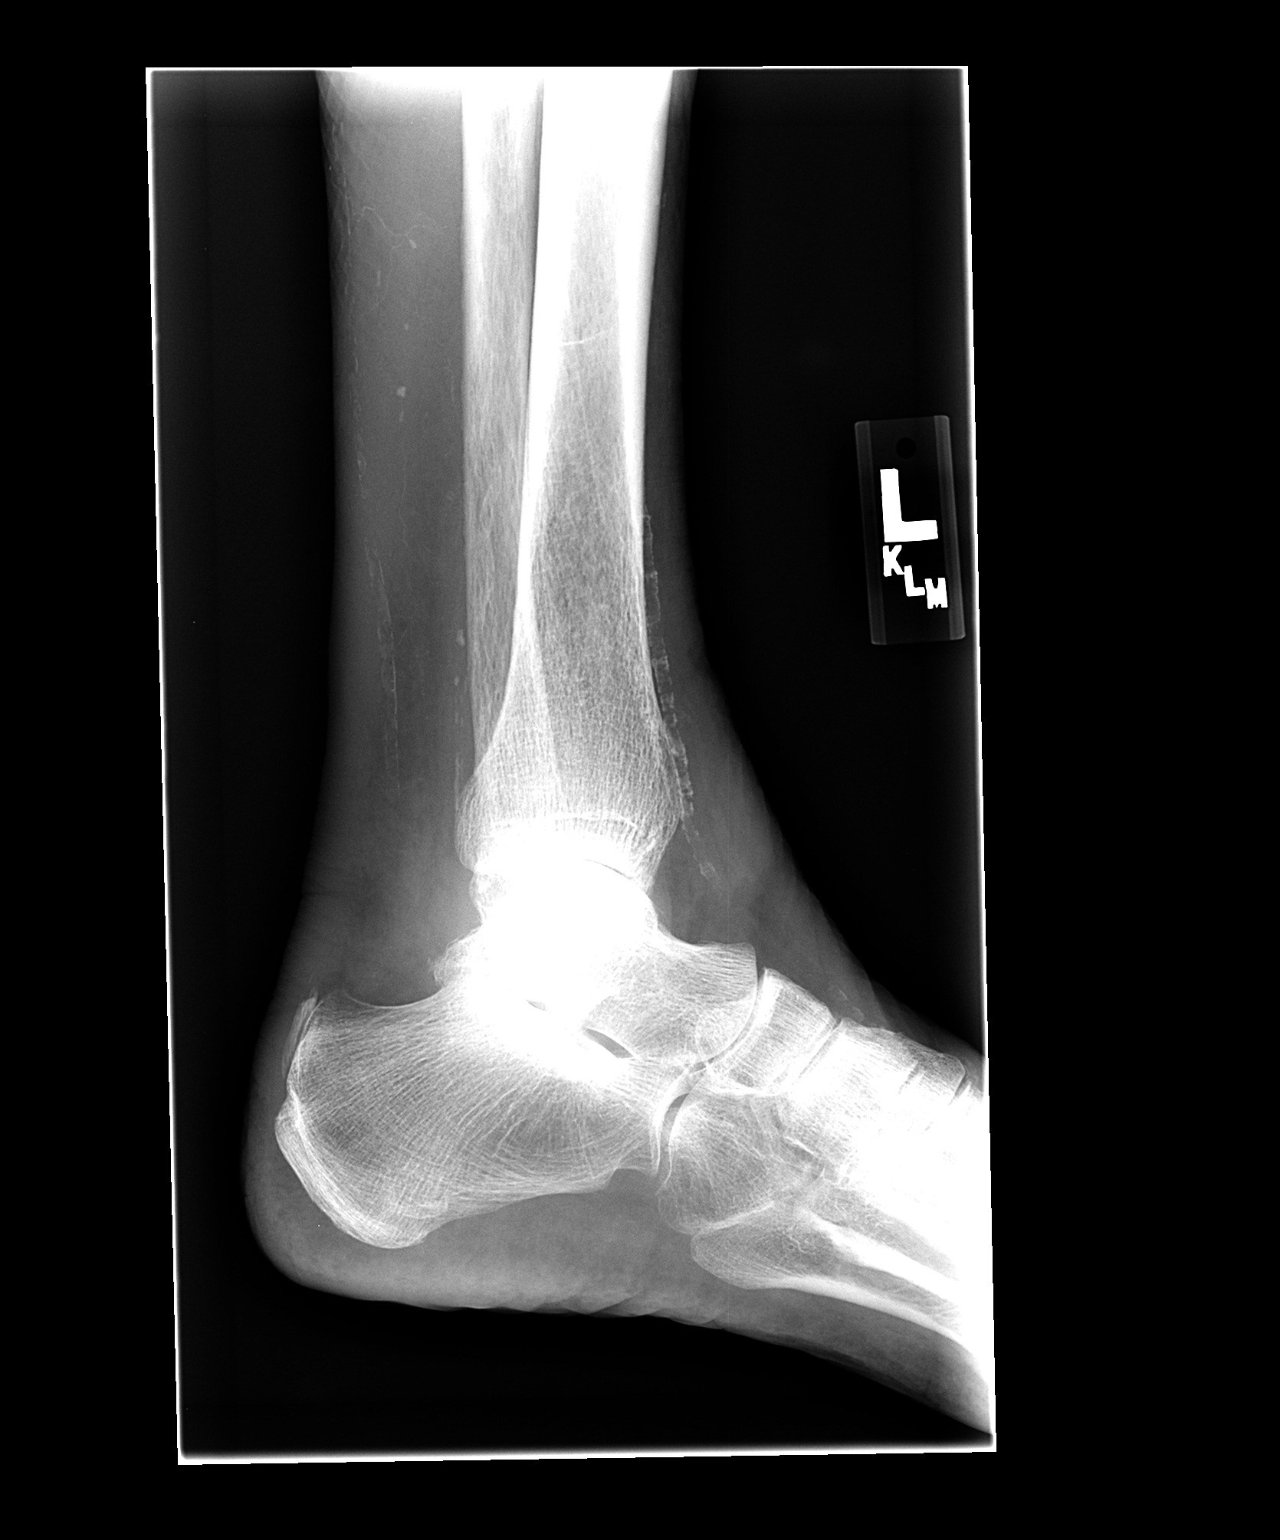

[3 of 3 positions shown; findings below may reference images not displayed]

FINDINGS: The bones are diffusely osteopenic. There is mild soft
tissue swelling particularly medially.  No acute fracture is seen.
Alignment is normal.  The ankle joint appears normal. Arterial
calcifications are noted.
IMPRESSION: Osteopenia and soft tissue swelling.  No acute abnormality.
Arterial calcification.

## 2017-01-03 NOTE — Progress Notes (Signed)
This encounter was created in error - please disregard.
